# Patient Record
Sex: Male | Born: 1980 | Race: Black or African American | Hispanic: No | Marital: Married | Smoking: Never smoker
Health system: Southern US, Community
[De-identification: ages and names within clinical notes are randomized; demographics above are authoritative.]

## PROBLEM LIST (undated history)

## (undated) DIAGNOSIS — I639 Cerebral infarction, unspecified: Secondary | ICD-10-CM

## (undated) DIAGNOSIS — Z8619 Personal history of other infectious and parasitic diseases: Secondary | ICD-10-CM

## (undated) DIAGNOSIS — M109 Gout, unspecified: Secondary | ICD-10-CM

## (undated) DIAGNOSIS — N289 Disorder of kidney and ureter, unspecified: Secondary | ICD-10-CM

## (undated) DIAGNOSIS — M549 Dorsalgia, unspecified: Secondary | ICD-10-CM

## (undated) DIAGNOSIS — I4891 Unspecified atrial fibrillation: Secondary | ICD-10-CM

## (undated) DIAGNOSIS — K5900 Constipation, unspecified: Secondary | ICD-10-CM

## (undated) DIAGNOSIS — Z8673 Personal history of transient ischemic attack (TIA), and cerebral infarction without residual deficits: Secondary | ICD-10-CM

## (undated) DIAGNOSIS — F419 Anxiety disorder, unspecified: Secondary | ICD-10-CM

## (undated) DIAGNOSIS — G4730 Sleep apnea, unspecified: Secondary | ICD-10-CM

## (undated) DIAGNOSIS — M7989 Other specified soft tissue disorders: Secondary | ICD-10-CM

## (undated) DIAGNOSIS — I10 Essential (primary) hypertension: Secondary | ICD-10-CM

## (undated) DIAGNOSIS — R0602 Shortness of breath: Secondary | ICD-10-CM

## (undated) HISTORY — DX: Gout, unspecified: M10.9

## (undated) HISTORY — DX: Shortness of breath: R06.02

## (undated) HISTORY — DX: Cerebral infarction, unspecified: I63.9

## (undated) HISTORY — DX: Dorsalgia, unspecified: M54.9

## (undated) HISTORY — DX: Sleep apnea, unspecified: G47.30

## (undated) HISTORY — DX: Personal history of other infectious and parasitic diseases: Z86.19

## (undated) HISTORY — DX: Disorder of kidney and ureter, unspecified: N28.9

## (undated) HISTORY — DX: Essential (primary) hypertension: I10

## (undated) HISTORY — DX: Constipation, unspecified: K59.00

## (undated) HISTORY — DX: Unspecified atrial fibrillation: I48.91

## (undated) HISTORY — DX: Anxiety disorder, unspecified: F41.9

## (undated) HISTORY — DX: Other specified soft tissue disorders: M79.89

## (undated) HISTORY — DX: Personal history of transient ischemic attack (TIA), and cerebral infarction without residual deficits: Z86.73

---

## 2008-12-04 ENCOUNTER — Inpatient Hospital Stay (HOSPITAL_COMMUNITY): Admission: EM | Admit: 2008-12-04 | Discharge: 2008-12-06 | Payer: Self-pay | Admitting: Emergency Medicine

## 2008-12-04 ENCOUNTER — Ambulatory Visit: Payer: Self-pay | Admitting: *Deleted

## 2008-12-05 ENCOUNTER — Encounter (INDEPENDENT_AMBULATORY_CARE_PROVIDER_SITE_OTHER): Payer: Self-pay | Admitting: *Deleted

## 2008-12-08 ENCOUNTER — Encounter (INDEPENDENT_AMBULATORY_CARE_PROVIDER_SITE_OTHER): Payer: Self-pay | Admitting: *Deleted

## 2008-12-13 ENCOUNTER — Encounter: Payer: Self-pay | Admitting: *Deleted

## 2008-12-13 LAB — CONVERTED CEMR LAB: LDL Cholesterol: 74 mg/dL

## 2008-12-15 ENCOUNTER — Ambulatory Visit: Payer: Self-pay | Admitting: Internal Medicine

## 2008-12-15 ENCOUNTER — Encounter (INDEPENDENT_AMBULATORY_CARE_PROVIDER_SITE_OTHER): Payer: Self-pay | Admitting: *Deleted

## 2008-12-15 DIAGNOSIS — I10 Essential (primary) hypertension: Secondary | ICD-10-CM | POA: Insufficient documentation

## 2008-12-15 DIAGNOSIS — R319 Hematuria, unspecified: Secondary | ICD-10-CM | POA: Insufficient documentation

## 2008-12-15 DIAGNOSIS — I471 Supraventricular tachycardia: Secondary | ICD-10-CM | POA: Insufficient documentation

## 2008-12-15 DIAGNOSIS — I517 Cardiomegaly: Secondary | ICD-10-CM | POA: Insufficient documentation

## 2008-12-15 HISTORY — DX: Essential (primary) hypertension: I10

## 2008-12-15 LAB — CONVERTED CEMR LAB
Calcium: 10 mg/dL (ref 8.4–10.5)
Glucose, Bld: 91 mg/dL (ref 70–99)
Sodium: 143 meq/L (ref 135–145)

## 2008-12-19 LAB — CONVERTED CEMR LAB
Hemoglobin, Urine: NEGATIVE
Nitrite: NEGATIVE
RBC / HPF: NONE SEEN (ref <3)
Specific Gravity, Urine: 1.023 (ref 1.005–1.03)
pH: 6 (ref 5.0–8.0)

## 2008-12-21 ENCOUNTER — Encounter (INDEPENDENT_AMBULATORY_CARE_PROVIDER_SITE_OTHER): Payer: Self-pay | Admitting: *Deleted

## 2008-12-21 ENCOUNTER — Ambulatory Visit: Payer: Self-pay | Admitting: Vascular Surgery

## 2008-12-29 ENCOUNTER — Ambulatory Visit: Payer: Self-pay | Admitting: Internal Medicine

## 2008-12-29 ENCOUNTER — Encounter: Payer: Self-pay | Admitting: Internal Medicine

## 2008-12-31 LAB — CONVERTED CEMR LAB
BUN: 19 mg/dL (ref 6–23)
Chloride: 102 meq/L (ref 96–112)
Creatinine, Ser: 1.33 mg/dL (ref 0.40–1.50)

## 2009-01-30 ENCOUNTER — Telehealth: Payer: Self-pay | Admitting: Internal Medicine

## 2009-02-06 ENCOUNTER — Ambulatory Visit: Payer: Self-pay | Admitting: *Deleted

## 2009-02-07 ENCOUNTER — Encounter (INDEPENDENT_AMBULATORY_CARE_PROVIDER_SITE_OTHER): Payer: Self-pay | Admitting: *Deleted

## 2009-03-01 ENCOUNTER — Telehealth (INDEPENDENT_AMBULATORY_CARE_PROVIDER_SITE_OTHER): Payer: Self-pay | Admitting: Internal Medicine

## 2009-03-31 ENCOUNTER — Telehealth (INDEPENDENT_AMBULATORY_CARE_PROVIDER_SITE_OTHER): Payer: Self-pay | Admitting: *Deleted

## 2009-05-05 ENCOUNTER — Telehealth (INDEPENDENT_AMBULATORY_CARE_PROVIDER_SITE_OTHER): Payer: Self-pay | Admitting: *Deleted

## 2009-07-05 ENCOUNTER — Telehealth: Payer: Self-pay | Admitting: *Deleted

## 2009-07-07 ENCOUNTER — Telehealth: Payer: Self-pay | Admitting: *Deleted

## 2009-07-25 ENCOUNTER — Ambulatory Visit: Payer: Self-pay | Admitting: Internal Medicine

## 2009-07-25 LAB — CONVERTED CEMR LAB
BUN: 20 mg/dL (ref 6–23)
Calcium: 10 mg/dL (ref 8.4–10.5)
Glucose, Bld: 99 mg/dL (ref 70–99)
Sodium: 140 meq/L (ref 135–145)

## 2009-08-08 ENCOUNTER — Ambulatory Visit: Payer: Self-pay | Admitting: Internal Medicine

## 2009-10-31 ENCOUNTER — Telehealth: Payer: Self-pay | Admitting: *Deleted

## 2009-11-11 ENCOUNTER — Emergency Department (HOSPITAL_COMMUNITY): Admission: EM | Admit: 2009-11-11 | Discharge: 2009-11-11 | Payer: Self-pay | Admitting: Family Medicine

## 2010-01-03 ENCOUNTER — Ambulatory Visit: Payer: Self-pay | Admitting: Internal Medicine

## 2010-01-10 ENCOUNTER — Telehealth: Payer: Self-pay | Admitting: Internal Medicine

## 2010-01-24 ENCOUNTER — Ambulatory Visit: Payer: Self-pay | Admitting: Internal Medicine

## 2010-02-07 ENCOUNTER — Ambulatory Visit: Payer: Self-pay | Admitting: Internal Medicine

## 2010-02-07 LAB — CONVERTED CEMR LAB
CO2: 31 meq/L (ref 19–32)
Calcium: 8.9 mg/dL (ref 8.4–10.5)
Sodium: 141 meq/L (ref 135–145)

## 2010-04-25 ENCOUNTER — Telehealth: Payer: Self-pay | Admitting: Internal Medicine

## 2010-06-08 ENCOUNTER — Telehealth: Payer: Self-pay | Admitting: Internal Medicine

## 2010-06-18 ENCOUNTER — Ambulatory Visit: Payer: Self-pay | Admitting: Internal Medicine

## 2010-06-18 LAB — CONVERTED CEMR LAB
CO2: 24 meq/L (ref 19–32)
Chloride: 103 meq/L (ref 96–112)
Sodium: 140 meq/L (ref 135–145)

## 2010-07-26 IMAGING — CT CT HEAD W/O CM
1 series · 16 of 30 positions shown, 20 images · non-contrast
Comparison: None

CLINICAL DATA: Hypertension.  Cardiomyopathy.  Atrial fibrillation.
Shortness of breath.  Hypertension.

CT HEAD WITHOUT CONTRAST
TECHNIQUE: Contiguous axial images were obtained from the base of
the skull through the vertex without contrast.

[Series 2: head routine 4.8 h37s · axial · 0.46mm/px · z∈[+1185,+1320]mm · 16 of 30 slices shown, 20 images]
[im 2/30  brain]
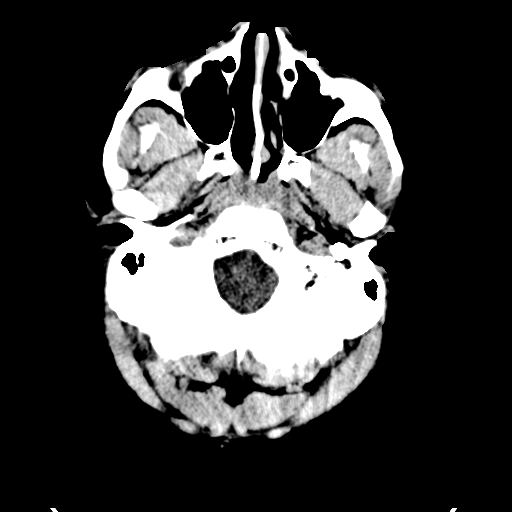
[im 2/30  bone]
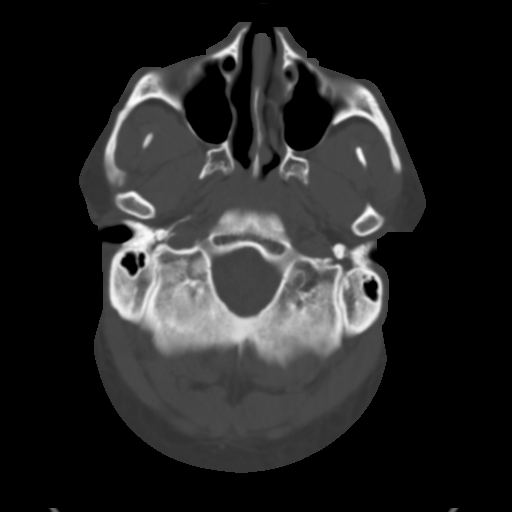
[im 4/30  brain]
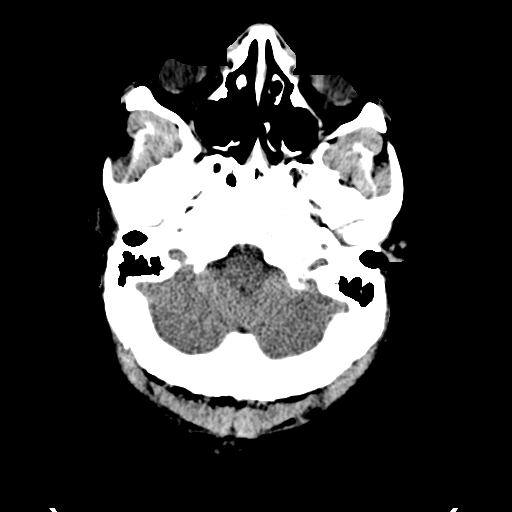
[im 6/30  brain]
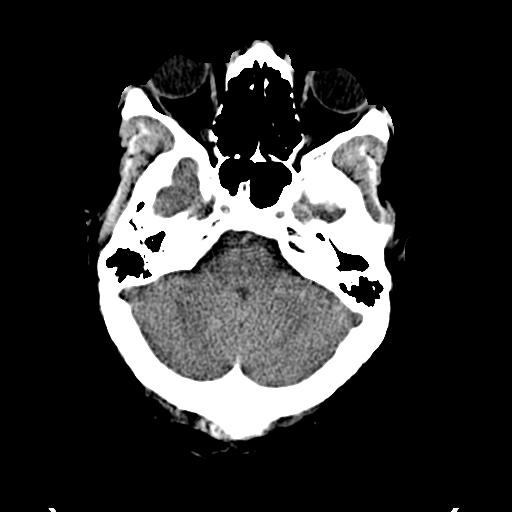
[im 8/30  brain]
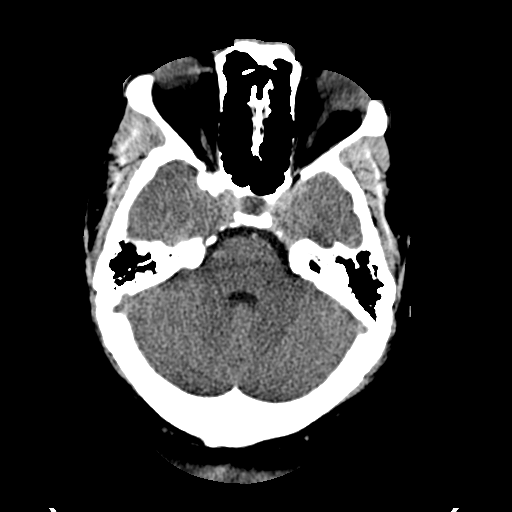
[im 9/30  brain]
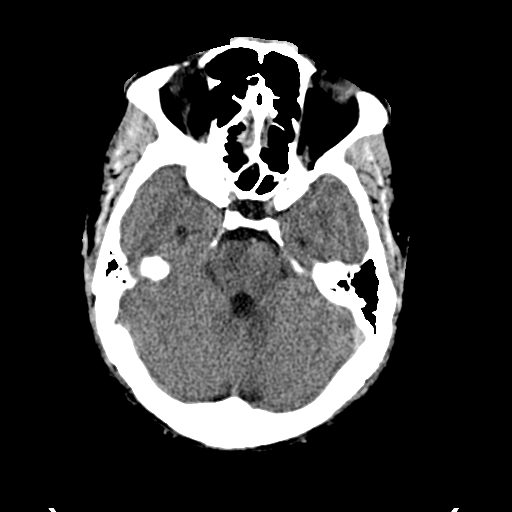
[im 9/30  bone]
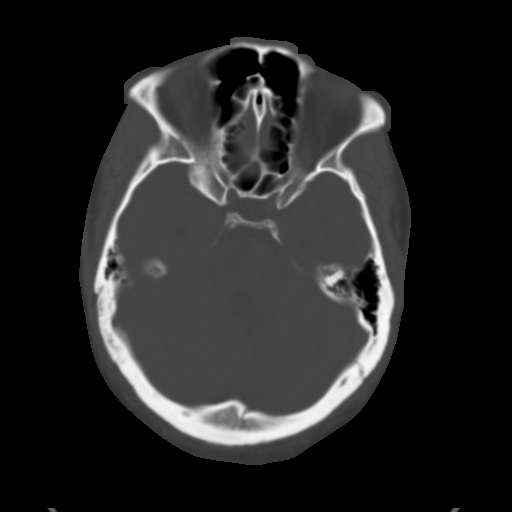
[im 11/30  brain]
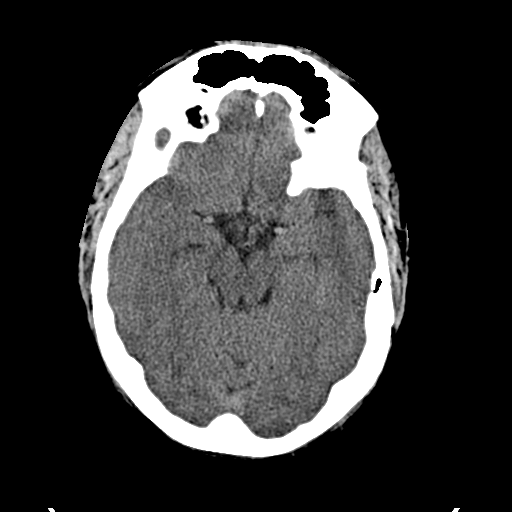
[im 13/30  brain]
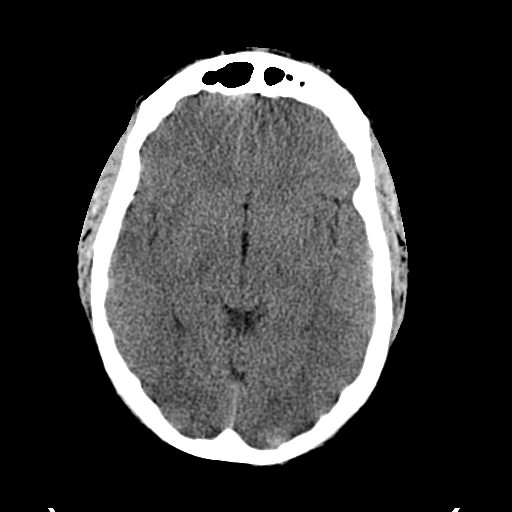
[im 15/30  brain]
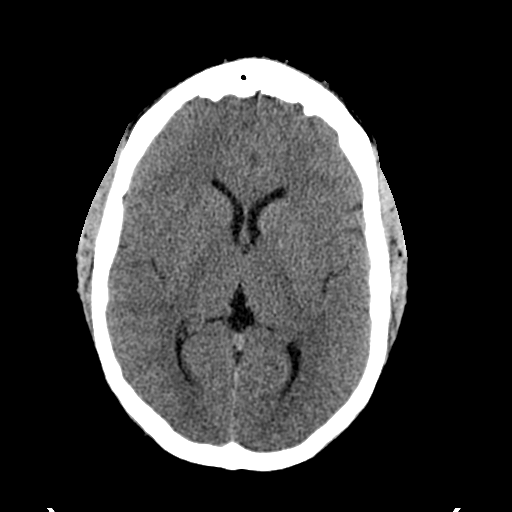
[im 16/30  brain]
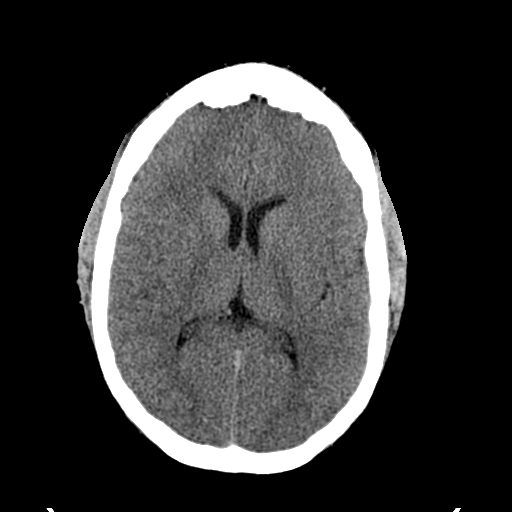
[im 16/30  bone]
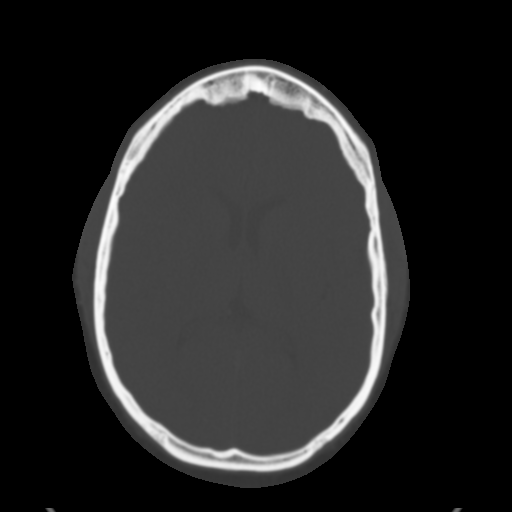
[im 18/30  brain]
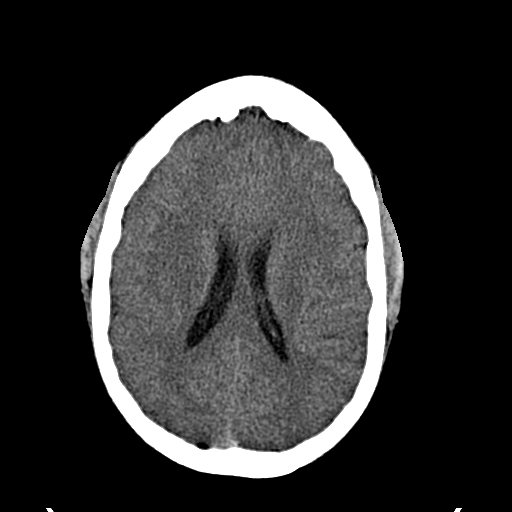
[im 20/30  brain]
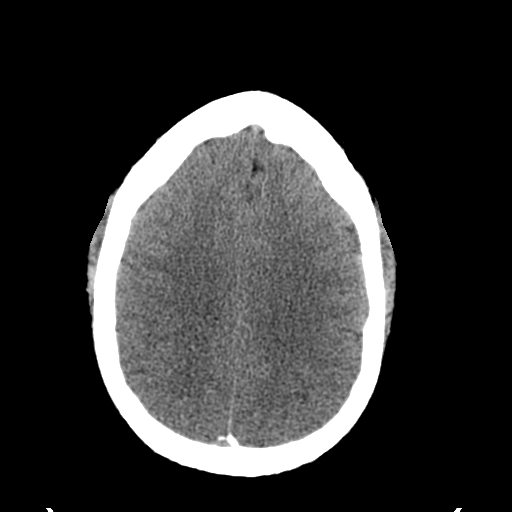
[im 22/30  brain]
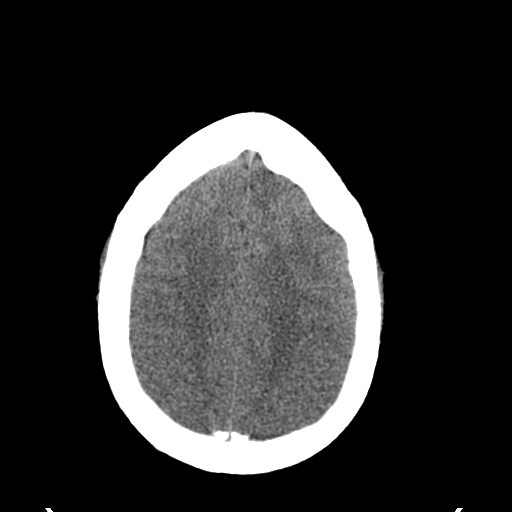
[im 23/30  brain]
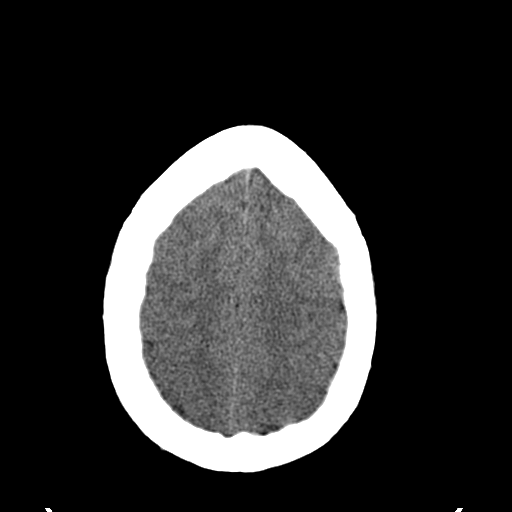
[im 23/30  bone]
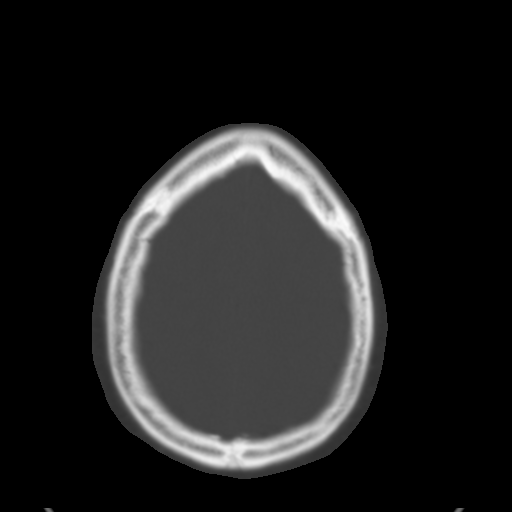
[im 25/30  brain]
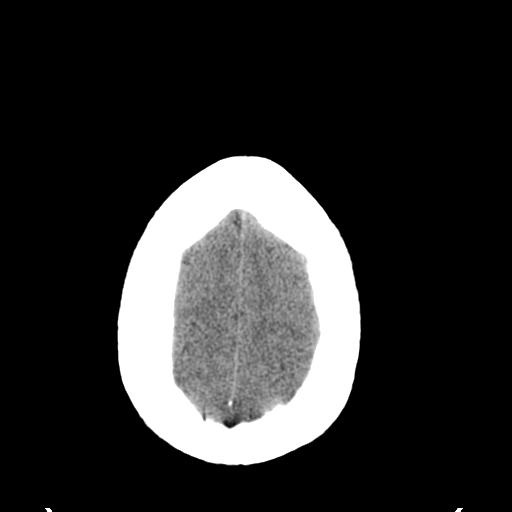
[im 27/30  brain]
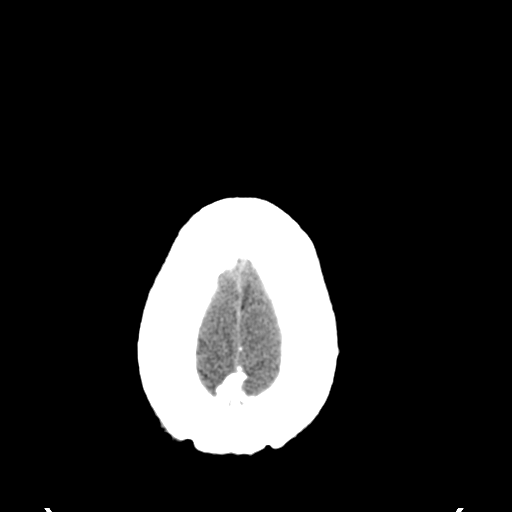
[im 29/30  brain]
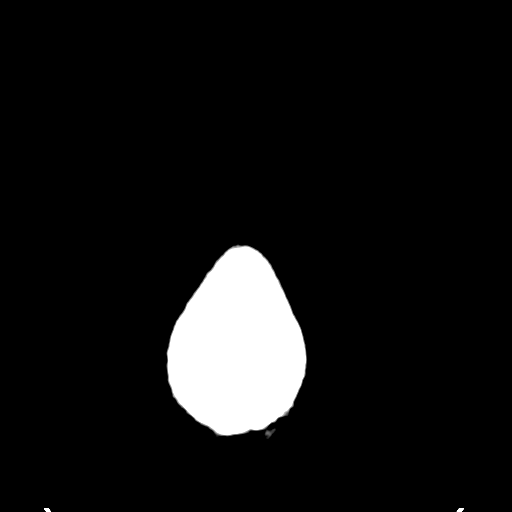

[16 of 30 positions shown; findings below may reference images not displayed]

FINDINGS: Faint density in the vicinity of the tentorium
posteriorly on image 13 of series 2 is ascribed to artifact.

The brain stem, cerebellum, cerebral peduncles, thalami, basal
ganglia, basilar cisterns, and ventricular system appear
unremarkable.

No intracranial hemorrhage, mass lesion, or acute CVA is
identified.
IMPRESSION: 1.  No acute intracranial findings are identified.

## 2010-07-26 IMAGING — CR DG CHEST 2V
2 series · 2 of 2 positions shown · non-contrast
Comparison: None

CLINICAL DATA: Cough and shortness of breath.

CHEST - 2 VIEW

[w chest pa]
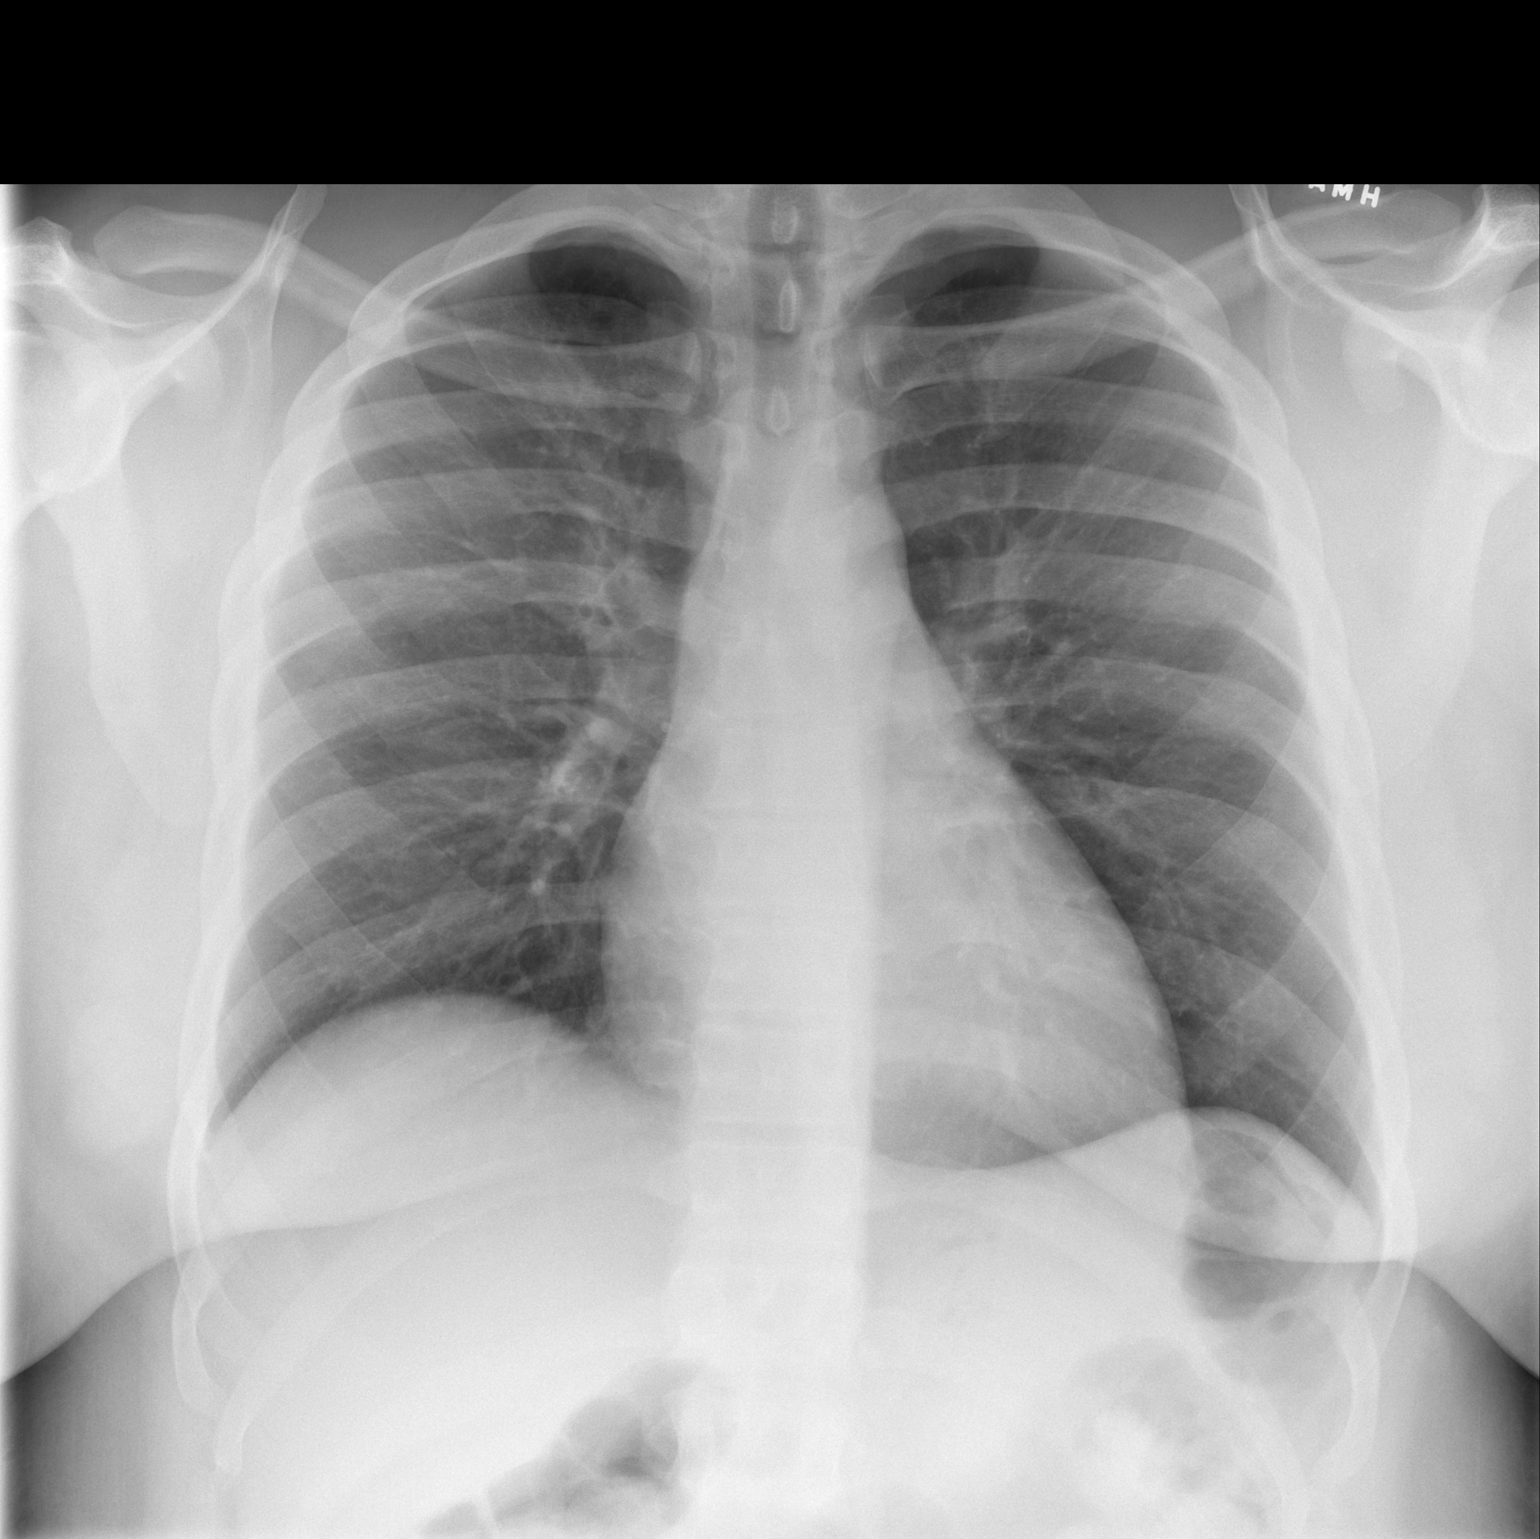

[w chest lat *]
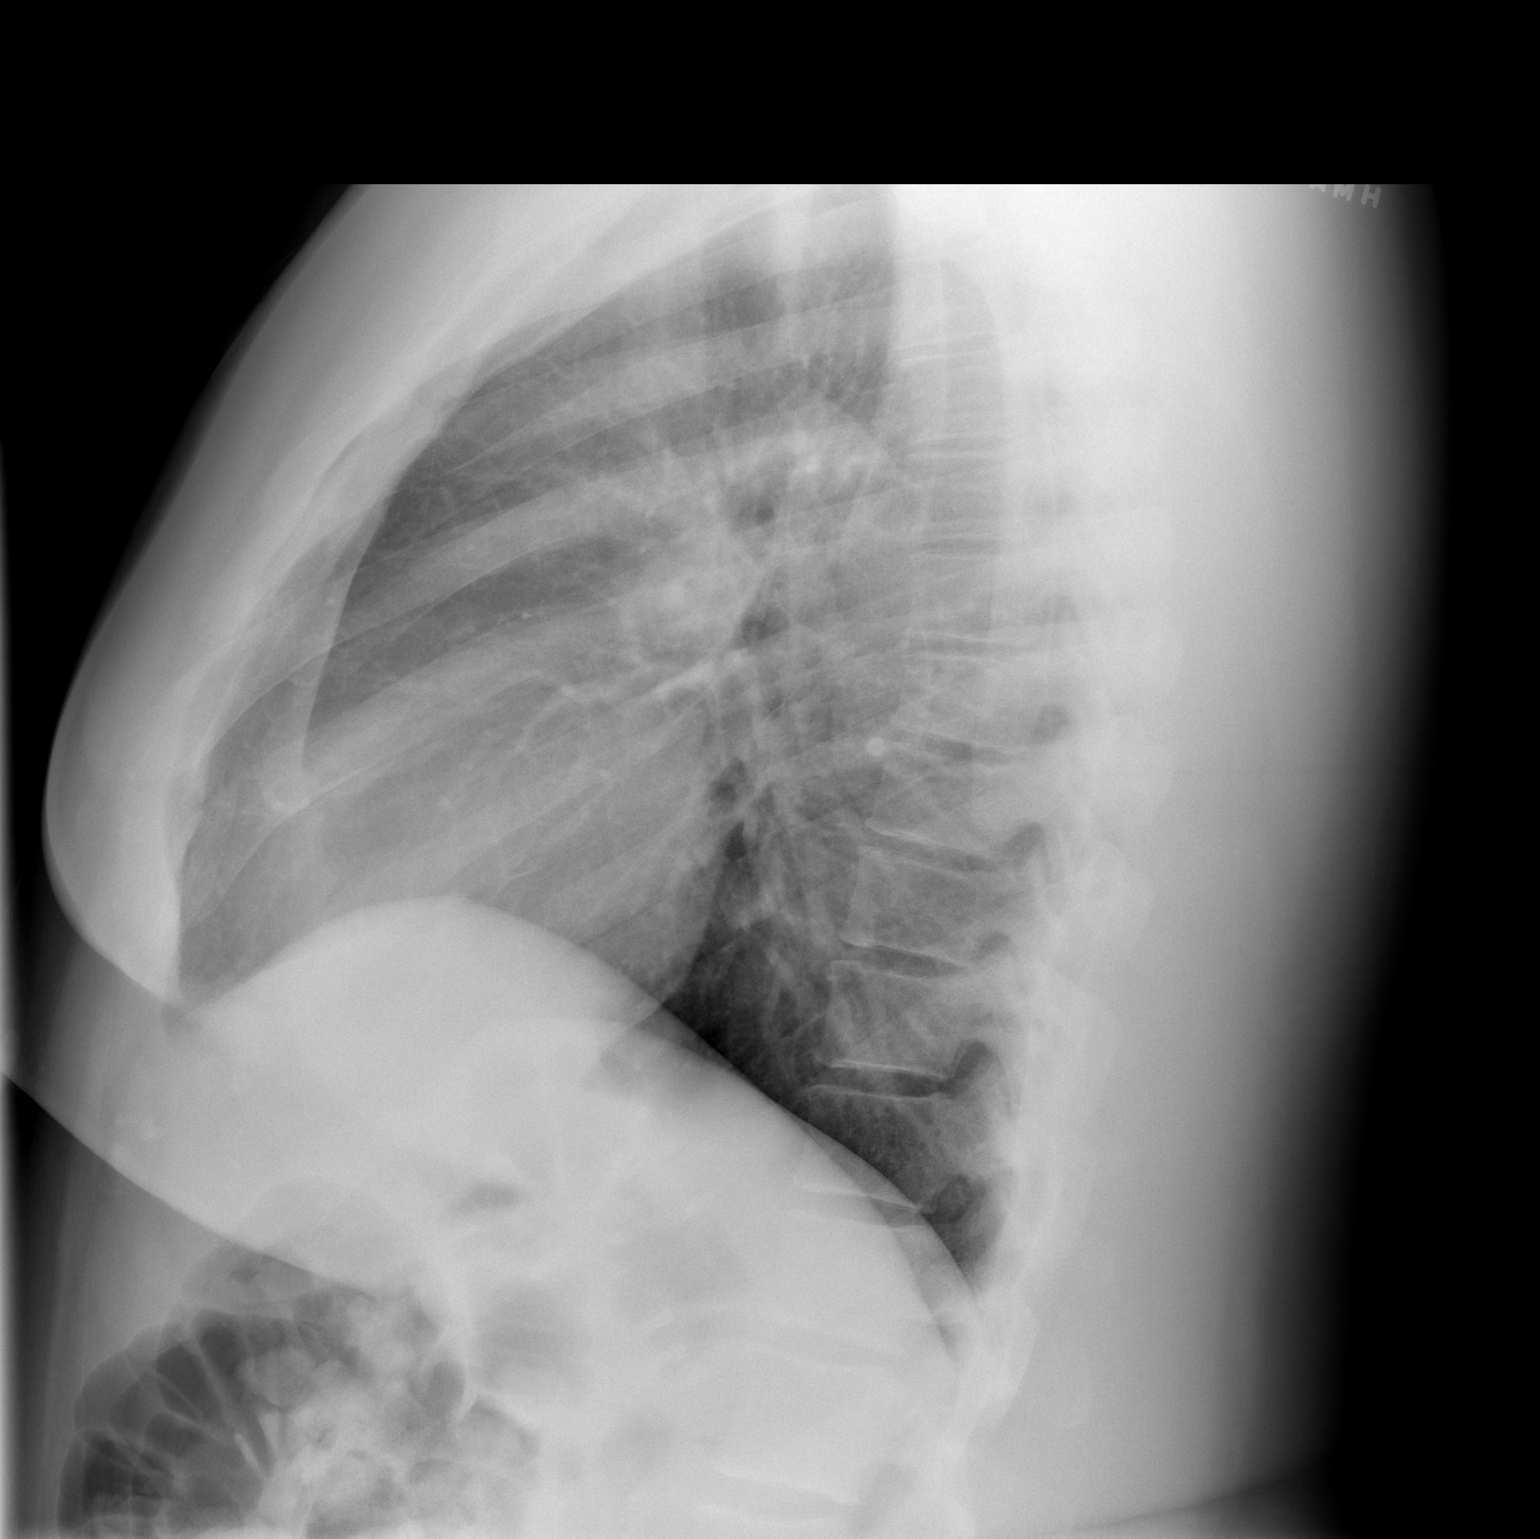

[2 of 2 positions shown; findings below may reference images not displayed]

FINDINGS: The cardiac silhouette, mediastinal and hilar contours
are within normal limits.  There is mild peribronchial thickening
and a few streaky areas of atelectasis.  These findings may be due
to smoking or bronchitis.  No focal infiltrates, edema or
effusions.  The bony thorax is intact.
IMPRESSION: 1.  Mild peribronchial thickening and a few streaky areas of
atelectasis.
2.  No infiltrates, edema or effusions.

## 2010-08-13 ENCOUNTER — Ambulatory Visit: Payer: Self-pay | Admitting: Internal Medicine

## 2010-08-13 LAB — CONVERTED CEMR LAB
BUN: 19 mg/dL (ref 6–23)
CO2: 26 meq/L (ref 19–32)
Chloride: 104 meq/L (ref 96–112)
Creatinine, Ser: 1.35 mg/dL (ref 0.40–1.50)
Glucose, Bld: 94 mg/dL (ref 70–99)

## 2010-08-23 ENCOUNTER — Ambulatory Visit: Payer: Self-pay | Admitting: Internal Medicine

## 2010-08-23 DIAGNOSIS — E663 Overweight: Secondary | ICD-10-CM | POA: Insufficient documentation

## 2010-10-29 ENCOUNTER — Encounter: Payer: Self-pay | Admitting: Internal Medicine

## 2010-10-29 ENCOUNTER — Ambulatory Visit: Payer: Self-pay | Admitting: Internal Medicine

## 2010-10-29 ENCOUNTER — Ambulatory Visit (HOSPITAL_COMMUNITY): Admission: RE | Admit: 2010-10-29 | Discharge: 2010-10-29 | Payer: Self-pay | Admitting: Internal Medicine

## 2010-10-29 DIAGNOSIS — I4891 Unspecified atrial fibrillation: Secondary | ICD-10-CM

## 2010-10-29 HISTORY — DX: Unspecified atrial fibrillation: I48.91

## 2010-11-18 ENCOUNTER — Emergency Department (HOSPITAL_COMMUNITY)
Admission: EM | Admit: 2010-11-18 | Discharge: 2010-11-18 | Payer: Self-pay | Source: Home / Self Care | Admitting: Emergency Medicine

## 2010-11-21 ENCOUNTER — Ambulatory Visit (HOSPITAL_COMMUNITY): Admission: RE | Admit: 2010-11-21 | Payer: Self-pay | Source: Home / Self Care | Admitting: Internal Medicine

## 2010-11-28 DIAGNOSIS — R002 Palpitations: Secondary | ICD-10-CM | POA: Insufficient documentation

## 2010-11-28 DIAGNOSIS — N289 Disorder of kidney and ureter, unspecified: Secondary | ICD-10-CM

## 2010-11-28 DIAGNOSIS — I428 Other cardiomyopathies: Secondary | ICD-10-CM | POA: Insufficient documentation

## 2010-11-28 DIAGNOSIS — N183 Chronic kidney disease, stage 3 unspecified: Secondary | ICD-10-CM | POA: Insufficient documentation

## 2010-11-28 HISTORY — DX: Disorder of kidney and ureter, unspecified: N28.9

## 2010-11-29 ENCOUNTER — Ambulatory Visit: Payer: Self-pay | Admitting: Cardiovascular Disease

## 2011-01-01 NOTE — Assessment & Plan Note (Signed)
Summary: acute-f/u with meds/cfb   Vital Signs:  Patient profile:   30 year old male Height:      73 inches (185.42 cm) Weight:      319.8 pounds (141.41 kg) BMI:     41.19 Temp:     98.4 degrees F (36.89 degrees C) oral Pulse (ortho):   72 / minute BP sitting:   139 / 100  (right arm) Cuff size:   large  Vitals Entered By: Theotis Barrio NT II (January 03, 2010 11:35 AM) CC: FOLLOW UP ON MEDICATION CHANGE  ( HAVING SIDE EFFECTS) Is Patient Diabetic? No Pain Assessment Patient in pain? no      Nutritional Status BMI of > 30 = obese  Have you ever been in a relationship where you felt threatened, hurt or afraid?No   Does patient need assistance? Functional Status Self care Ambulation Normal Comments FOLLOW UP ON MEDICATION CHANGE (HAVING SIDE EFFECTS)   Primary Care Provider:  Linward Foster MD  CC:  FOLLOW UP ON MEDICATION CHANGE  ( HAVING SIDE EFFECTS).  History of Present Illness: 30 yr old man with pmhx as described below comes to the clinic for hypertension follow up. Patient reports that he stopped taking lisinopril because he was having erectile dysfunction with medication.   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 Take 1 Tablet By Mouth Two Times A Day 2)  Hydrochlorothiazide 25 Mg Tabs (Hydrochlorothiazide) .... Take 1 Tablet By Mouth Once A Day 3)  Norvasc 10 Mg Tabs (Amlodipine Besylate)  .... Take 1 Tablet By Mouth Once A Day 4)  Lisinopril 10 Mg Tabs (Lisinopril) .... Take 1 Tablet By Mouth Once A Day  Current Medications (verified): 1)  Coreg 25 Mg Tabs (Carvedilol) .... Take 1 Tablet By Mouth Two Times A Day 2)  Hydrochlorothiazide 25 Mg Tabs (Hydrochlorothiazide) .... Take 1 Tablet By Mouth Once A Day 3)  Norvasc 10 Mg Tabs (Amlodipine Besylate) .... Take 1 Tablet By Mouth Once A Day  Allergies: No Known Drug Allergies  Past History:  Past Medical History: Last updated: 12/29/2008 Hypertension  Risk Factors: Alcohol Use: <1 (01/03/2010) Exercise: yes (01/03/2010)  Risk Factors: Smoking Status: never (01/03/2010) Passive Smoke Exposure: no (01/03/2010)  Review of Systems  The patient denies fever, chest pain, syncope, dyspnea on exertion, peripheral edema, prolonged cough, headaches, hemoptysis, abdominal pain, melena, hematochezia, hematuria, muscle weakness, difficulty walking, and unusual weight change.    Physical Exam  General:  NAD Mouth:   pharynx pink and moist, no erythema, and no exudates.    Neck:  supple.   Lungs:  Normal respiratory effort, chest expands symmetrically. Lungs are clear to auscultation, no crackles or wheezes. Heart:  Normal rate and regular rhythm. S1 and S2 normal without gallop, murmur, click, rub or other extra sounds. Abdomen:  soft, non-tender, and normal bowel sounds.   Msk:  normal ROM.  Extremities:  no edema  Neurologic:  alert & oriented X3, strength normal in all extremities, sensation intact to light touch, and gait normal.     Impression & Recommendations:  Problem # 1:  HYPERTENSION (ICD-401.9) Above goal. Not taking lisinopril due to impotence. Patient only took medication for 2 weeks.  Will restart clonidine and recheck BP in one month.  The following medications were removed from the medication list:    Lisinopril 10 Mg Tabs (Lisinopril) .Marland Kitchen... Take 1 tablet by mouth once a day His updated medication  list for this problem includes:    Coreg 25 Mg Tabs (Carvedilol) .Marland Kitchen... Take 1 tablet by mouth two times a day    Hydrochlorothiazide 25 Mg Tabs (Hydrochlorothiazide) .Marland Kitchen... Take 1 tablet by mouth once a day    Norvasc 10 Mg Tabs (Amlodipine besylate) .Marland Kitchen... Take 1 tablet by mouth once a day    Catapres 0.1 Mg Tabs (Clonidine hcl) .Marland Kitchen... Take 1 tablet by mouth two times a day  BP today: 139/100 Prior BP: 140/100 (08/08/2009)  Labs Reviewed: K+: 4.0 (07/25/2009) Creat: : 1.43 (07/25/2009)   LDL: 74  --  12/05/2008 (12/13/2008)     Complete Medication List: 1)  Coreg 25 Mg Tabs (Carvedilol) .... Take 1 tablet by mouth two times a day 2)  Hydrochlorothiazide 25 Mg Tabs (Hydrochlorothiazide) .... Take 1 tablet by mouth once a day 3)  Norvasc 10 Mg Tabs (Amlodipine besylate) .... Take 1 tablet by mouth once a day 4)  Catapres 0.1 Mg Tabs (Clonidine hcl) .... Take 1 tablet by mouth two times a day  Patient Instructions: 1)  Please schedule a follow-up appointment in 1 month. 2)  Stop taking lisinopril and start taking clonidine as prescribed. 3)  Take all other medication as directed. Prescriptions: CATAPRES 0.1 MG TABS (CLONIDINE HCL) Take 1 tablet by mouth two times a day  #60 x 1   Entered and Authorized by:   Laren Everts MD   Signed by:   Laren Everts MD on 01/03/2010   Method used:   Electronically to        [facility] [address] (retail)       [id number]       [place], [place]  [address]       Ph: [phone]       Fax: [phone]   RxID:   [id number]    Prevention & Chronic Care Immunizations   Influenza vaccine: Not documented    Tetanus booster: Not documented    Pneumococcal vaccine: Not documented  Other Screening   Smoking status: never  (01/03/2010)  Hypertension   Last Blood Pressure: 139 / 100  (01/03/2010)   Serum creatinine: 1.43  (07/25/2009)   Serum potassium 4.0  (07/25/2009)     Hypertension flowsheet reviewed?: Yes   Progress toward BP goal: Unchanged  Self-Management Support :   Personal Goals (by the next clinic visit) :      Personal blood pressure goal: 140/90  (01/03/2010)   Patient will work on the following items until the next clinic visit to reach self-care goals:     Medications and monitoring: take my medicines every day, bring all of my medications to every visit  (01/03/2010)     Eating: eat more vegetables, use fresh or frozen vegetables, eat foods that are low in salt, eat baked foods instead of fried foods  (08/08/2009)     Activity: park at the far  end of the parking lot  (08/08/2009)    Hypertension self-management support: Written self-care plan  (01/03/2010)   Hypertension self-care plan printed.

## 2011-01-01 NOTE — Assessment & Plan Note (Signed)
Summary: EST-3 MONTH F/U VISIT/CH   Vital Signs:  Patient profile:   30 year old male Height:      73 inches (185.42 cm) Weight:      330.7 pounds (150.32 kg) BMI:     43.79 Temp:     97.5 degrees F (36.39 degrees C) oral Pulse rate:   65 / minute BP sitting:   148 / 100  (left arm)  Vitals Entered By: Stanton Kidney Ditzler RN (October 29, 2010 4:05 PM) Is Patient Diabetic? No Pain Assessment Patient in pain? no      Nutritional Status BMI of > 30 = obese Nutritional Status Detail appetite good  Have you ever been in a relationship where you felt threatened, hurt or afraid?denies   Does patient need assistance? Functional Status Self care Ambulation Normal Comments FU   Primary Care Provider:  Darnelle Maffucci MD   History of Present Illness: Thomas [name] is 30 year old man with pmh significant for HTN, SVT, and possible HCM, who presents today for Blood pressure recheck and regular followup.   States he has been taking his medications as prescribed. He has no acute complaints or concerns today. He denies SOB, cough, cp, headache, N/V, changes in bowel and urinary habits.      Depression History:      The patient denies a depressed mood most of the day and a diminished interest in his usual daily activities.         Preventive Screening-Counseling & Management  Alcohol-Tobacco     Alcohol drinks/day: <1     Alcohol type: spirits     Smoking Status: never     Passive Smoke Exposure: no  Caffeine-Diet-Exercise     Does Patient Exercise: yes     Type of exercise: CARDIO     Times/week: 2-3  Current Medications (verified): 1)  Coreg Cr 80 Mg Xr24h-Cap (Carvedilol Phosphate) .... Take 1 Tablet By Mouth Once A Day 2)  Benicar Hct 40-25 Mg Tabs (Olmesartan Medoxomil-Hctz) .... Take 1 Tablet By Mouth Once A Day 3)  Norvasc 10 Mg Tabs (Amlodipine Besylate) .... Take 1 Tablet By Mouth Once A Day  Allergies (verified): No Known Drug Allergies  Review of Systems       Per  HPI  Physical Exam  General:  alert and well-developed.   Lungs:  normal respiratory effort and normal breath sounds.   Heart:  Irregularly irregular rhythm, no murmur, no gallop, and no rub.   Abdomen:  soft, non-tender, and normal bowel sounds.   Msk:  normal ROM.   Pulses:  +2 pulses throughout Extremities:  no LE edema  Neurologic:  alert & oriented X3.     Impression & Recommendations:  Problem # 1:  ATRIAL FIBRILLATION (ICD-427.31) Assessment New Denies further palpitations, however on exam i noted an Irreguar rhythm therefore i obtained an EKG which showed AFIB,  his CHADS2 score is 1. will start on baby aspirin today. he is rate controlled with his coreg, however given this new finding and echo from last year showing possible HCM, will repeat ECHO and send to Cardio for possible cardioversion and further management for his AFIB.    The following medications were removed from the medication list:    Coreg Cr 20 Mg Xr24h-cap (Carvedilol phosphate) .Marland Kitchen... Take 1 tablet by mouth once a day His updated medication list for this problem includes:    Coreg Cr 80 Mg Xr24h-cap (Carvedilol phosphate) .Marland Kitchen... Take 1 tablet by mouth once a  day    Norvasc 10 Mg Tabs (Amlodipine besylate) .Marland Kitchen... Take 1 tablet by mouth once a day    Th Aspirin Low Dose 81 Mg Tbec (Aspirin) .Marland Kitchen... Take 1 tablet by mouth once a day  Orders: 2 D Echo (2 D Echo) Cardiology Referral (Cardiology)  Problem # 2:  VENTRICULAR HYPERTROPHY, LEFT (ICD-429.3) Assessment: Comment Only Some concern for hypertrophic cardiomyopathy per echo, however pt denies any family history of this.  ECHO FROM 2010   -  Left ventricular size was normal.   -  Overall left ventricular systolic function was normal.   -  Left ventricular ejection fraction was estimated , range being 55         % to 60 %.   -  This study was inadequate for the evaluation of left ventricular         regional wall motion.   -  Left ventricular wall  thickness was markedly increased         (concentric - question long standing hypertension versus         familial hypertrophic cardiomyopathy. There was no         obstruction).  Problem # 3:  HYPERTENSION, BENIGN ESSENTIAL, UNCONTROLLED (ICD-401.1) Assessment: Comment Only slightly elevated, will increase coreg.   The following medications were removed from the medication list:    Coreg Cr 20 Mg Xr24h-cap (Carvedilol phosphate) .Marland Kitchen... Take 1 tablet by mouth once a day His updated medication list for this problem includes:    Coreg Cr 80 Mg Xr24h-cap (Carvedilol phosphate) .Marland Kitchen... Take 1 tablet by mouth once a day    Benicar Hct 40-25 Mg Tabs (Olmesartan medoxomil-hctz) .Marland Kitchen... Take 1 tablet by mouth once a day    Norvasc 10 Mg Tabs (Amlodipine besylate) .Marland Kitchen... 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 Take 1 tablet by mouth once a day His updated medication list for this problem includes:    Coreg Cr 80 Mg Xr24h-cap (Carvedilol phosphate) .Marland Kitchen... Take 1 tablet by mouth once a day    Th Aspirin Low Dose 81 Mg Tbec (Aspirin) .Marland Kitchen... Take 1 tablet by mouth once a day  Orders: 12 Lead EKG (12 Lead EKG)  Complete Medication List: 1)  Coreg Cr 80 Mg Xr24h-cap (Carvedilol phosphate) .... Take 1 tablet by mouth once a day 2)  Benicar Hct 40-25 Mg Tabs (Olmesartan medoxomil-hctz) .... Take 1 tablet by mouth once a day 3)  Norvasc 10 Mg Tabs (Amlodipine besylate) .... Take 1 tablet by mouth once a day 4)  Th Aspirin Low Dose 81 Mg Tbec (Aspirin) .... Take 1 tablet by mouth once a day  Other Orders: Influenza Vaccine NON MCR ([address])  Anticoagulation Management Assessment/Plan:             Patient Instructions: 1)  Please schedule a follow-up appointment in 6 months. 2)  Please  take a baby aspirin daily for AFIB Prescriptions: TH ASPIRIN LOW DOSE 81 MG TBEC (ASPIRIN) Take 1 tablet by mouth once a day  #30 x 3   Entered and Authorized by:   Darnelle Maffucci MD   Signed by:   Darnelle Maffucci MD on 10/29/2010   Method used:   Electronically to        [facility]* (retail)  [address]       [address]mail[address]       [place], [place]  [address]       Ph: [phone]       Fax: [phone]   RxID:   [id number] COREG CR 80 MG XR24H-CAP (CARVEDILOL PHOSPHATE) Take 1 tablet by mouth once a day  #30 x 3   Entered and Authorized by:   Darnelle Maffucci MD   Signed by:   Darnelle Maffucci MD on 10/29/2010   Method used:   Electronically to        [facility]* (retail)       [address]       [address]mail[address]       [place], [place]  [address]       Ph: [phone]       Fax: [phone]   RxID:   [id number]    Orders Added: 1)  12 Lead EKG [12 Lead EKG] 2)  Est. Patient Level IV [88416] 3)  Influenza Vaccine NON MCR [00028] 4)  2 D Echo [2 D Echo] 5)  Cardiology Referral [Cardiology]   Immunizations Administered:  Influenza Vaccine # 1:    Vaccine Type: Fluvax Non-MCR    Site: right deltoid    Mfr: GlaxoSmithKline    Dose: 0.5 ml    Route: IM    Given by: Stanton Kidney Ditzler RN    Exp. Date: 06/01/2011    Lot #: [id number]    VIS given: 06/26/10 version given October 29, 2010.  Flu Vaccine Consent Questions:    Do you have a history of severe allergic reactions to this vaccine? no    Any prior history of allergic reactions to egg and/or gelatin? no    Do you have a sensitivity to the preservative Thimersol? no    Do you have a past history of Guillan-Barre Syndrome? no    Do you currently have an acute febrile illness? no    Have you ever had a severe reaction to latex? no    Vaccine information given and explained to patient? yes   Immunizations Administered:  Influenza  Vaccine # 1:    Vaccine Type: Fluvax Non-MCR    Site: right deltoid    Mfr: GlaxoSmithKline    Dose: 0.5 ml    Route: IM    Given by: Stanton Kidney Ditzler RN    Exp. Date: 06/01/2011    Lot #: [id number]    VIS given: 06/26/10 version given October 29, 2010.  Prevention & Chronic Care Immunizations   Influenza vaccine: Fluvax Non-MCR  (10/29/2010)   Influenza vaccine deferral: Deferred  (08/13/2010)    Tetanus booster: Not documented   Td booster deferral: Refused  (06/18/2010)    Pneumococcal vaccine: Not documented  Other Screening   Smoking status: never  (10/29/2010)  Hypertension   Last Blood Pressure: 148 / 100  (10/29/2010)   Serum creatinine: 1.35  (08/13/2010)   BMP action: Ordered   Serum potassium 4.1  (08/13/2010)    Hypertension flowsheet reviewed?: Yes   Progress toward BP goal: Unchanged  Self-Management Support :   Personal Goals (by the next clinic visit) :      Personal blood pressure goal: 140/90  (01/03/2010)   Patient will work on the following items until the next clinic visit to reach self-care goals:     Medications and monitoring: take my medicines every day, check my blood pressure, bring all of my  medications to every visit, weigh myself weekly  (10/29/2010)     Eating: drink diet soda or water instead of juice or soda, eat more vegetables, use fresh or frozen vegetables, eat foods that are low in salt, eat baked foods instead of fried foods, eat fruit for snacks and desserts  (10/29/2010)     Activity: take a 30 minute walk every day  (10/29/2010)    Hypertension self-management support: , Referred for medical nutrition therapy, Written self-care plan, Education handout, Resources for patients handout  (10/29/2010)   Hypertension self-care plan printed.   Hypertension education handout printed      Resource handout printed.   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

## 2011-01-01 NOTE — Assessment & Plan Note (Signed)
Summary: EST-2 MONTH F/U VISIT/CH   Vital Signs:  Patient profile:   30 year old male Height:      73 inches (185.42 cm) Weight:      323.4 pounds (147.00 kg) BMI:     42.82 Temp:     97.8 degrees F (36.56 degrees C) oral Pulse rate:   86 / minute BP sitting:   193 / 136  (right arm)  Vitals Entered By: Stanton Kidney Ditzler RN (August 13, 2010 3:02 PM) CC: elevated blood pressure Is Patient Diabetic? No Pain Assessment Patient in pain? no      Nutritional Status BMI of > 30 = obese Nutritional Status Detail appetite good  Have you ever been in a relationship where you felt threatened, hurt or afraid?denies   Does patient need assistance? Functional Status Self care Ambulation Normal Comments FU   Primary Care Provider:  Darnelle Maffucci MD  CC:  elevated blood pressure.  History of Present Illness: Patient is here today for regular follow up.  BP is 193/136 on manual check it was 182/123., given his once a day regimen which he has been sticking to pretty well, however he ran out of meds this weekend.   He has gained 15 pounds since the last time he was here and wants to loose weight.  No other complaints.  Depression History:      The patient denies a depressed mood most of the day and a diminished interest in his usual daily activities.         Preventive Screening-Counseling & Management  Alcohol-Tobacco     Alcohol drinks/day: <1     Alcohol type: spirits     Smoking Status: never     Passive Smoke Exposure: no  Caffeine-Diet-Exercise     Does Patient Exercise: yes     Type of exercise: CARDIO     Times/week: 2-3  Current Medications (verified): 1)  Coreg Cr 40 Mg Xr24h-Cap (Carvedilol Phosphate) .... Take 1 Tablet By Mouth Once A Day 2)  Benicar Hct 40-25 Mg Tabs (Olmesartan Medoxomil-Hctz) .... Take 1 Tablet By Mouth Once A Day 3)  Norvasc 10 Mg Tabs (Amlodipine Besylate) .... Take 1 Tablet By Mouth Once A Day 4)  Coreg Cr 20 Mg Xr24h-Cap (Carvedilol  Phosphate) .... Take 1 Tablet By Mouth Once A Day  Allergies (verified): No Known Drug Allergies  Review of Systems       as per hpi  Physical Exam  General:  alert, cooperative to examination, and overweight-appearing.   Lungs:  normal respiratory effort and no accessory muscle use.   Heart:  Normal rate and regular rhythm. S1 and S2 normal without gallop, murmur, click, rub or other extra sounds. Msk:  normal ROM.   Extremities:  no edema  Neurologic:  alert & oriented X3, cranial nerves II-XII intact, and gait normal.     Impression & Recommendations:  Problem # 1:  HYPERTENSION, BENIGN ESSENTIAL, UNCONTROLLED (ICD-401.1) Assessment Deteriorated BP worrisome today. will increase his coreg to 60mg  by adding 20mg  to current regiment of 40.  and give him his meds and recheck in one week, will also get BMET given his high BP.   His updated medication list for this problem includes:    Coreg Cr 40 Mg Xr24h-cap (Carvedilol phosphate) .Marland Kitchen... Take 1 tablet by mouth once a day    Benicar Hct 40-25 Mg Tabs (Olmesartan medoxomil-hctz) .Marland Kitchen... Take 1 tablet by mouth once a day    Norvasc 10 Mg Tabs (Amlodipine  besylate) .Marland Kitchen... Take 1 tablet by mouth once a day    Coreg Cr 20 Mg Xr24h-cap (Carvedilol phosphate) .Marland Kitchen... 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   His updated medication list for this problem includes:    Coreg Cr 40 Mg Xr24h-cap (Carvedilol phosphate) .Marland Kitchen... Take 1 tablet by mouth once a day    Coreg Cr 20 Mg Xr24h-cap (Carvedilol phosphate) .Marland Kitchen... Take 1 tablet by mouth once a day  Complete Medication List: 1)  Coreg Cr 40 Mg Xr24h-cap (Carvedilol phosphate) .... Take 1 tablet by mouth once a day 2)  Benicar Hct 40-25 Mg Tabs (Olmesartan medoxomil-hctz) .... Take 1 tablet by  mouth once a day 3)  Norvasc 10 Mg Tabs (Amlodipine besylate) .... Take 1 tablet by mouth once a day 4)  Coreg Cr 20 Mg Xr24h-cap (Carvedilol phosphate) .... Take 1 tablet by mouth once a day  Patient Instructions: 1)  Please schedule a follow-up appointment in 1 week. Prescriptions: COREG CR 40 MG XR24H-CAP (CARVEDILOL PHOSPHATE) Take 1 tablet by mouth once a day  #30 x 3   Entered and Authorized by:   Darnelle Maffucci MD   Signed by:   Darnelle Maffucci MD on 08/13/2010   Method used:   Electronically to        [facility]* (retail)       [address]       [address] Shipping/mailing       [place], [place]  [address]       Ph: [phone]       Fax: [phone]   RxID:   [id number] BENICAR HCT 40-25 MG TABS (OLMESARTAN MEDOXOMIL-HCTZ) Take 1 tablet by mouth once a day  #30 x 3   Entered and Authorized by:   Darnelle Maffucci MD   Signed by:   Darnelle Maffucci MD on 08/13/2010   Method used:   Electronically to        [facility]* (retail)       [address]       [address] Shipping/mailing       [place], [place]  [address]       Ph: [phone]       Fax: [phone]   RxID:   [id number] NORVASC 10 MG TABS (AMLODIPINE BESYLATE) Take 1 tablet by mouth once a day  #30 x 6   Entered and Authorized by:   Darnelle Maffucci MD   Signed by:   Darnelle Maffucci MD on 08/13/2010   Method used:   Electronically to        [facility]* (retail)       [address]       [address] Shipping/mailing       [place], [place]  [address]       Ph: [phone]       Fax: [phone]   RxID:   [id number] COREG CR 20 MG XR24H-CAP (CARVEDILOL PHOSPHATE) Take 1 tablet by mouth once a day  #30 x 3   Entered and Authorized by:   Darnelle Maffucci MD   Signed by:   Darnelle Maffucci MD on 08/13/2010   Method used:   Electronically to        [facility]* (  retail)       [address]       [address]  Shipping/mailing       [place], [place]  [address]       Ph: [phone]       Fax: [phone]   RxID:   [id number]  Process Orders Check Orders Results:     [facility]: ABN not required for this insurance Tests Sent for requisitioning (August 13, 2010 4:00 PM):     08/13/2010: [facility] -- T-Basic Metabolic Panel [id number] (signed)     Prevention & Chronic Care Immunizations   Influenza vaccine: Not documented   Influenza vaccine deferral: Deferred  (08/13/2010)    Tetanus booster: Not documented   Td booster deferral: Refused  (06/18/2010)    Pneumococcal vaccine: Not documented  Other Screening   Smoking status: never  (08/13/2010)  Hypertension   Last Blood Pressure: 193 / 136  (08/13/2010)   Serum creatinine: 1.24  (06/18/2010)   BMP action: Ordered   Serum potassium 3.9  (06/18/2010)  Self-Management Support :   Personal Goals (by the next clinic visit) :      Personal blood pressure goal: 140/90  (01/03/2010)   Patient will work on the following items until the next clinic visit to reach self-care goals:     Medications and monitoring: take my medicines every day, check my blood pressure, bring all of my medications to every visit, weigh myself weekly  (08/13/2010)     Eating: eat more vegetables, use fresh or frozen vegetables, eat foods that are low in salt, eat baked foods instead of fried foods, eat fruit for snacks and desserts  (08/13/2010)     Activity: take a 30 minute walk every day  (08/13/2010)    Hypertension self-management support: Written self-care plan, Education handout, Resources for patients handout  (08/13/2010)   Hypertension self-care plan printed.   Hypertension education handout printed      Resource handout printed.

## 2011-01-01 NOTE — Assessment & Plan Note (Signed)
Summary: 2WK F/U/TOBBIA/VS   Vital Signs:  Patient profile:   30 year old male Height:      73 inches (185.42 cm) Weight:      319.2 pounds (145.09 kg) BMI:     42.27 Temp:     99.3 degrees F (37.39 degrees C) oral Pulse rate:   82 / minute BP sitting:   128 / 88  (right arm) Cuff size:   large  Vitals Entered By: Krystal Eaton Cassell Dull) (February 07, 2010 10:44 AM) CC: 2wk bp reck Is Patient Diabetic? No Pain Assessment Patient in pain? no      Nutritional Status BMI of > 30 = obese  Have you ever been in a relationship where you felt threatened, hurt or afraid?No   Does patient need assistance? Functional Status Self care Ambulation Normal   CC:  2wk bp reck.  Preventive Screening-Counseling & Management  Alcohol-Tobacco     Alcohol drinks/day: <1     Alcohol type: spirits     Smoking Status: never     Passive Smoke Exposure: no  Allergies: No Known Drug Allergies   Impression & Recommendations:  Problem # 1:  HYPERTENSION (ICD-401.9) Assessment Improved Patient was here today for a follow up appointment for a check of his BP and Bmet. His BP is well controlled today, he is compliant with his meds and his K and Crt are WNL. I will have him back in 1 month for a BP check up and after that every 6 months. His updated medication list for this problem includes:    Coreg Cr 40 Mg Xr24h-cap (Carvedilol phosphate) .Marland Kitchen... Take 1 tablet by mouth once a day    Benicar Hct 40-25 Mg Tabs (Olmesartan medoxomil-hctz) .Marland Kitchen... Take 1 tablet by mouth once a day    Norvasc 10 Mg Tabs (Amlodipine besylate) .Marland Kitchen... Take 1 tablet by mouth once a day  Orders: T-Basic Metabolic Panel [id number])  BP today: 128/88 Prior BP: 135/92 (01/24/2010)  Labs Reviewed: K+: 4.0 (07/25/2009) Creat: : 1.43 (07/25/2009)   LDL: 74  --  12/05/2008 (12/13/2008)     Problem # 2:  Preventive Health Care (ICD-V70.0) Assessment: Comment Only  Reviewed preventive care protocols, scheduled due  services, and updated immunizations. No vaccines or procedures due at this time.  Complete Medication List: 1)  Coreg Cr 40 Mg Xr24h-cap (Carvedilol phosphate) .... Take 1 tablet by mouth once a day 2)  Benicar Hct 40-25 Mg Tabs (Olmesartan medoxomil-hctz) .... Take 1 tablet by mouth once a day 3)  Norvasc 10 Mg Tabs (Amlodipine besylate) .... Take 1 tablet by mouth once a day  Patient Instructions: 1)  Please schedule a follow-up appointment in 1 month. 2)  It is important that you exercise regularly at least 20 minutes 5 times a week. If you develop chest pain, have severe difficulty breathing, or feel very tired , stop exercising immediately and seek medical attention. 3)  You need to lose weight. Consider a lower calorie diet and regular exercise.  4)  It is not healthy  for men to drink more than 2-3 drinks per day or for women to drink more than 1-2 drinks per day. 5)  Check your Blood Pressure regularly. If it is above: 140/90 you should make an appointment. Prescriptions: NORVASC 10 MG TABS (AMLODIPINE BESYLATE) Take 1 tablet by mouth once a day  #30 x 1   Entered and Authorized by:   Lars Mage MD   Signed by:   Vondra Aldredge Eben Burow  MD on 02/07/2010   Method used:   Faxed to ...       [facility] (retail)       [address] [place], [place]  [address]       Ph: [phone]       Fax: [phone]   RxID:   [id number] BENICAR HCT 40-25 MG TABS (OLMESARTAN MEDOXOMIL-HCTZ) Take 1 tablet by mouth once a day  #30 x 2   Entered and Authorized by:   Lars Mage MD   Signed by:   Lars Mage MD on 02/07/2010   Method used:   Faxed to ...       [facility] (retail)       [address] [place], [place]  [address]       Ph: [phone]       Fax: [phone]   RxID:   [id number] COREG CR 40 MG XR24H-CAP (CARVEDILOL PHOSPHATE) Take 1 tablet by mouth once a day  #30 x 2   Entered and Authorized  by:   Lars Mage MD   Signed by:   Lars Mage MD on 02/07/2010   Method used:   Faxed to ...       [facility] (retail)       [address] [place], [place]  [address]       Ph: [phone]       Fax: [phone]   RxID:   [id number] NORVASC 10 MG TABS (AMLODIPINE BESYLATE) Take 1 tablet by mouth once a day  #30 x 1   Entered and Authorized by:   Lars Mage MD   Signed by:   Lars Mage MD on 02/07/2010   Method used:   Print then Give to Patient   RxID:   [id number] BENICAR HCT 40-25 MG TABS (OLMESARTAN MEDOXOMIL-HCTZ) Take 1 tablet by mouth once a day  #30 x 2   Entered and Authorized by:   Lars Mage MD   Signed by:   Lars Mage MD on 02/07/2010   Method used:   Print then Give to Patient   RxID:   [id number] COREG CR 40 MG XR24H-CAP (CARVEDILOL PHOSPHATE) Take 1 tablet by mouth once a day  #30 x 2   Entered and Authorized by:   Lars Mage MD   Signed by:   Lars Mage MD on 02/07/2010   Method used:   Print then Give to Patient   RxID:   [id number]  Process Orders Check Orders Results:     [facility]: ABN not required for this insurance Tests Sent for requisitioning (February 07, 2010 1:33 PM):     02/07/2010: [facility] -- T-Basic Metabolic Panel [id number] (signed)   Prevention & Chronic Care Immunizations   Influenza vaccine: Not documented   Influenza vaccine deferral: Refused  (02/07/2010)    Tetanus booster: Not documented   Td booster deferral: Deferred  (02/07/2010)    Pneumococcal vaccine: Not documented  Other Screening   Smoking status: never  (02/07/2010)  Hypertension   Last Blood Pressure: 128 / 88  (02/07/2010)   Serum creatinine: 1.43  (07/25/2009)   Serum potassium 4.0  (07/25/2009)    Hypertension flowsheet reviewed?:  Yes   Progress toward BP goal: At goal  Self-Management Support :   Personal Goals (by the next clinic visit) :       Personal blood pressure goal: 140/90  (01/03/2010)   Patient will work on the following items until the next clinic visit to reach self-care goals:     Medications and monitoring: take my medicines every day  (02/07/2010)     Eating: eat foods that are low in salt, eat baked foods instead of fried foods  (02/07/2010)     Activity: join a walking program  (02/07/2010)    Hypertension self-management support: Pre-printed educational material, Education handout, Resources for patients handout, Written self-care plan  (02/07/2010)   Hypertension self-care plan printed.   Hypertension education handout printed      Resource handout printed.   Appended Document: 2WK F/U/TOBBIA/VS     Primary Care Provider:  Darnelle Maffucci MD   History of Present Illness: Patient is 30 year old man with PMH of uncontrolled hypertention presents today for a follow up appointmnet and lab check as he was started on a new med.  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 Take 1 Tablet By Mouth Once A Day 2)  Benicar Hct 40-25 Mg Tabs (Olmesartan Medoxomil-Hctz) .... Take 1 Tablet By Mouth Once A Day 3)  Norvasc 10 Mg Tabs (Amlodipine Besylate) .... Take 1 Tablet By Mouth Once A Day  Current Medications (verified): 1)  Coreg Cr 40 Mg Xr24h-Cap (Carvedilol Phosphate) .... Take 1 Tablet By Mouth Once A Day 2)  Benicar Hct 40-25 Mg Tabs (Olmesartan Medoxomil-Hctz) .... Take 1 Tablet By Mouth Once A Day 3)  Norvasc 10 Mg Tabs (Amlodipine Besylate) .... Take 1 Tablet By Mouth Once A Day  Allergies (verified): No Known Drug Allergies  Past History:  Past Medical History: Last updated: 12/29/2008 Hypertension  Risk Factors: Alcohol  Use: <1 (02/07/2010) Exercise: yes (01/24/2010)  Risk Factors: Smoking Status: never (02/07/2010) Passive Smoke Exposure: no (02/07/2010)  Review of Systems      See HPI  Physical Exam  Additional Exam:  Gen: AOx3, in no acute distress Eyes: PERRL, EOMI ENT:MMM, No erythema noted in posterior pharynx Neck: No JVD, No LAP Chest: CTAB with  good respiratory effort CVS: regular rhythmic rate, NO M/R/G, S1 S2 normal Abdo: soft,ND, BS+x4, Non tender and No hepatosplenomegaly EXT: No odema noted Neuro: Non focal, gait is normal Skin: no rashes noted.    Complete Medication List: 1)  Coreg Cr 40 Mg Xr24h-cap (Carvedilol phosphate) .... Take 1 tablet by mouth once a day 2)  Benicar Hct 40-25 Mg Tabs (Olmesartan medoxomil-hctz) .... Take 1 tablet by mouth once a day 3)  Norvasc 10 Mg Tabs (Amlodipine besylate) .... Take 1 tablet by mouth once a day  Appended Document: 2WK F/U/TOBBIA/VS Above Rxs. called to [facility];per pt.

## 2011-01-01 NOTE — Progress Notes (Signed)
Summary: refill/ hla  Phone Note Refill Request Message from:  Fax from Pharmacy on June 08, 2010 3:07 PM  Refills Requested: Medication #1:  COREG CR 40 MG XR24H-CAP Take 1 tablet by mouth once a day   Dosage confirmed as above?Dosage Confirmed   Last Refilled: 4/1  Medication #2:  BENICAR HCT 40-25 MG TABS Take 1 tablet by mouth once a day   Dosage confirmed as above?Dosage Confirmed   Last Refilled: 4/1 last visit and labs  3/9  Initial call taken by: Marin Roberts RN,  June 08, 2010 3:07 PM  Follow-up for Phone Call        Refill approved-nurse to complete Follow-up by: Julaine Fusi  DO,  June 08, 2010 5:25 PM    Prescriptions: BENICAR HCT 40-25 MG TABS (OLMESARTAN MEDOXOMIL-HCTZ) Take 1 tablet by mouth once a day  #30 x 2   Entered by:   Julaine Fusi  DO   Authorized by:   Darnelle Maffucci MD   Signed by:   Julaine Fusi  DO on 06/08/2010   Method used:   Electronically to        [facility]* (retail)       [address]       [address] Shipping/mailing       [place], [place]  [address]       Ph: [phone]       Fax: [phone]   RxID:   [id number] COREG CR 40 MG XR24H-CAP (CARVEDILOL PHOSPHATE) Take 1 tablet by mouth once a day  #30 x 2   Entered by:   Julaine Fusi  DO   Authorized by:   Darnelle Maffucci MD   Signed by:   Julaine Fusi  DO on 06/08/2010   Method used:   Electronically to        [facility]* (retail)       [address]       [address] Shipping/mailing       [place], [place]  [address]       Ph: [phone]       Fax: [phone]   RxID:   [id number]

## 2011-01-01 NOTE — Progress Notes (Signed)
Summary: refill/gg  Phone Note Refill Request  on Apr 25, 2010 3:50 PM  Refills Requested: Medication #1:  NORVASC 10 MG TABS Take 1 tablet by mouth once a day.  Follow-up for Phone Call        please advise patient to make a f/u at [facility] for further refills.  Follow-up by: Darnelle Maffucci MD,  Apr 26, 2010 11:51 AM  Additional Follow-up for Phone Call Additional follow up Details #1::        [facility]  sent to [facility] for an appt. Additional Follow-up by: Chinita Pester RN,  Apr 26, 2010 2:13 PM    Prescriptions: NORVASC 10 MG TABS (AMLODIPINE BESYLATE) Take 1 tablet by mouth once a day  #30 x 1   Entered and Authorized by:   Darnelle Maffucci MD   Signed by:   Chinita Pester RN on 04/26/2010   Method used:   Electronically to        [facility]* (retail)       [address]       [address] Shipping/mailing       [place], [place]  [address]       Ph: [phone]       Fax: [phone]   RxID:   [id number]

## 2011-01-01 NOTE — Assessment & Plan Note (Signed)
Summary: EST-CK/FU/MEDS/CFB   Vital Signs:  Patient profile:   30 year old male Height:      73 inches (185.42 cm) Weight:      332.4 pounds (151.09 kg) BMI:     44.01 Temp:     97.9 degrees F (36.61 degrees C) oral Pulse rate:   59 / minute BP sitting:   144 / 92  (left arm) Cuff size:   large  Vitals Entered By: Cynda Familia Bartley Dull) (June 18, 2010 4:21 PM) CC: f/u HTN Is Patient Diabetic? No Pain Assessment Patient in pain? no      Nutritional Status BMI of > 30 = obese  Have you ever been in a relationship where you felt threatened, hurt or afraid?No   Does patient need assistance? Functional Status Self care Ambulation Normal   Primary Care Provider:  Darnelle Maffucci MD  CC:  f/u HTN.  History of Present Illness: Patient is here today for regular follow up.  BP is 144/92, given his once a day regimen which he has been sticking to pretty well, I would not increase his meds at this time. Will check bmet.  He has gained 15 pounds since the last time he was here and wants to loose weight.  No other complaints.  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 Take 1 Tablet By Mouth Once A Day 2)  Benicar Hct 40-25 Mg Tabs (Olmesartan Medoxomil-Hctz) .... Take 1 Tablet By Mouth Once A Day 3)  Norvasc 10 Mg Tabs (Amlodipine Besylate) .... Take 1 Tablet By Mouth Once A Day  Current Medications (verified): 1)  Coreg Cr 40 Mg Xr24h-Cap (Carvedilol Phosphate) .... Take 1 Tablet By Mouth Once A Day 2)  Benicar Hct 40-25 Mg  Tabs (Olmesartan Medoxomil-Hctz) .... Take 1 Tablet By Mouth Once A Day 3)  Norvasc 10 Mg Tabs (Amlodipine Besylate) .... Take 1 Tablet By Mouth Once A Day  Allergies (verified): No Known Drug Allergies  Past History:  Past Medical History: Last updated: 12/29/2008 Hypertension  Risk Factors: Alcohol Use: <1 (06/18/2010) Exercise: yes (01/24/2010)  Risk Factors: Smoking Status: never (06/18/2010) Passive Smoke Exposure: no (06/18/2010)  Review of Systems      See HPI  Physical Exam  Additional Exam:  Gen: AOx3, in no acute distress Eyes: PERRL, EOMI ENT:MMM, No erythema noted in posterior pharynx Neck: No JVD, No LAP Chest: CTAB with  good respiratory effort CVS: regular rhythmic rate, NO M/R/G, S1 S2 normal Abdo: soft,ND, BS+x4, Non tender and No hepatosplenomegaly EXT: No odema noted Neuro: Non focal, gait is normal Skin: no rashes noted.    Impression & Recommendations:  Problem # 1:  HYPERTENSION, BENIGN ESSENTIAL, UNCONTROLLED (ICD-401.1) Assessment Deteriorated Patient has gained 15 pounds since last seen, I discussed various weight loss strategies with him including some documenraries that he could see for some ideas and inspiration. This is most likely contributing to his increased BP. Will check Bmet for K and Crt. No changes inmeds today as he is very complinat with his current regimen. We agrred upon weight  loss as the primary treatment startegy at this time and may be we can discuss weight loss the next time we follow up.  His updated medication list for this problem includes:    Coreg Cr 40 Mg Xr24h-cap (Carvedilol phosphate) .Marland Kitchen... Take 1 tablet by mouth once a day    Benicar Hct 40-25 Mg Tabs (Olmesartan medoxomil-hctz) .Marland Kitchen... Take 1 tablet by mouth once a day    Norvasc 10 Mg Tabs (Amlodipine besylate) .Marland Kitchen... Take 1 tablet by mouth once a day  Orders: T-Basic Metabolic Panel [id number])  BP today: 144/92 Prior BP: 128/88 (02/07/2010)  Labs  Reviewed: K+: 3.6 (02/07/2010) Creat: : 1.32 (02/07/2010)   LDL: 74  --  12/05/2008 (12/13/2008)     Problem # 2:  PAROXYSMAL SUPRAVENTRICULAR TACHYCARDIA (ICD-427.0) Assessment: Comment Only rate and rhytm was normal sinus and regular respectively. He HR wass 59 which given he is on a BB is explanable.  His updated medication list for this problem includes:    Coreg Cr 40 Mg Xr24h-cap (Carvedilol phosphate) .Marland Kitchen... Take 1 tablet by mouth once a day  Labs Reviewed: Na: 141 (02/07/2010)   K+: 3.6 (02/07/2010)   CL: 107 (02/07/2010)   HCO3: 31 (02/07/2010) Ca: 8.9 (02/07/2010)   TSH: 1.674 (12/29/2008)   HCO3: 31 (02/07/2010)  Problem # 3:  Preventive Health Care (ICD-V70.0) Assessment: Comment Only  Reviewed preventive care protocols, scheduled due services, and updated immunizations. Refused tetanus.  Complete Medication List: 1)  Coreg Cr 40 Mg Xr24h-cap (Carvedilol phosphate) .... Take 1 tablet by mouth once a day 2)  Benicar Hct 40-25 Mg Tabs (Olmesartan medoxomil-hctz) .... Take 1 tablet by mouth once a day 3)  Norvasc 10 Mg Tabs (Amlodipine besylate) .... Take 1 tablet by mouth once a day  Patient Instructions: 1)  Please go on this website"Fat, sick and nearly dead" and watch this movie. 2)  Please schedule a follow-up appointment in 2 weeks. 3)  It is important that you exercise regularly at least 20 minutes 5 times a week. If you develop chest pain, have severe difficulty breathing, or feel very tired , stop exercising immediately and seek medical attention. 4)  You need to lose weight. Consider a lower calorie diet and regular exercise.  5)  Check your Blood Pressure regularly. If it is above: 140/90 you should make an appointment. 6)  Limit your Sodium (Salt). Process Orders Check Orders Results:     [facility]: ABN not required for this insurance Tests Sent for requisitioning (June 19, 2010 11:00 AM):     06/18/2010: [facility] -- T-Basic  Metabolic Panel [id number] (signed)    Prevention & Chronic Care Immunizations   Influenza vaccine: Not documented   Influenza vaccine deferral: Deferred  (06/18/2010)    Tetanus booster: Not documented   Td booster deferral: Refused  (06/18/2010)    Pneumococcal vaccine: Not documented  Other Screening   Smoking status: never  (06/18/2010)  Hypertension   Last Blood Pressure: 144 / 92  (06/18/2010)   Serum creatinine: 1.32  (02/07/2010)   BMP action: Ordered   Serum potassium 3.6  (02/07/2010)    Hypertension flowsheet reviewed?: Yes   Progress toward BP goal: At goal  Self-Management Support :   Personal Goals (by the next clinic visit) :      Personal blood pressure goal: 140/90  (01/03/2010)   Patient will work on the following items until the next clinic visit to reach self-care goals:     Medications and  monitoring: take my medicines every day  (06/18/2010)     Eating: eat foods that are low in salt, eat baked foods instead of fried foods  (06/18/2010)     Activity: take a 30 minute walk every day  (06/18/2010)    Hypertension self-management support: Pre-printed educational material, Resources for patients handout  (06/18/2010)      Resource handout printed.    Process Orders Check Orders Results:     [facility]: ABN not required for this insurance Tests Sent for requisitioning (June 19, 2010 11:00 AM):     06/18/2010: [facility] -- T-Basic Metabolic Panel [id number] (signed)

## 2011-01-01 NOTE — Assessment & Plan Note (Signed)
Summary: F/U TOBBIA   Vital Signs:  Patient profile:   30 year old male Height:      73 inches (185.42 cm) Weight:      320.9 pounds (145.36 kg) BMI:     42.49 Temp:     97.3 degrees F (36.28 degrees C) Pulse rate:   69 / minute BP sitting:   135 / 92  (right arm) Cuff size:   large  Vitals Entered By: Theotis Barrio NT II (January 24, 2010 4:00 PM) CC:  PATIENT IS HERE FOR BP FOLLOW UP APPT  /  NO CONCERNS NOR COMPLAINTS Is Patient Diabetic? No Pain Assessment Patient in pain? no      Nutritional Status BMI of > 30 = obese  Have you ever been in a relationship where you felt threatened, hurt or afraid?No   Does patient need assistance? Functional Status Self care Ambulation Normal Comments PATIENT IS HERE FOR BP FOLLO W UP APPT / NO CONCERNS NOR  COMPLAINTS   Primary Care Provider:  Linward Foster MD  CC:   PATIENT IS HERE FOR BP FOLLOW UP APPT  /  NO CONCERNS NOR COMPLAINTS.  History of Present Illness: Mr Fickle is a 30 yo man with PMH as outlined below.  He is here to f/u on BP.  He reports taking his medications as prescribed.  He admits to occasionally forgetting to take his pm dose of coreg and clonidine but this happens infrequently.  He would also like to know if there is any way to simplify the regimen in any way.     Depression History:      The patient denies a depressed mood most of the day and a diminished interest in his usual daily activities.         Preventive Screening-Counseling & Management  Alcohol-Tobacco     Alcohol drinks/day: <1     Alcohol type: spirits     Smoking Status: never     Passive Smoke Exposure: no  Caffeine-Diet-Exercise     Does Patient Exercise: yes     Type of exercise: CARDIO     Times/week: 2-3  Medications Prior to Update: 1)  Coreg 25 Mg Tabs (Carvedilol) .... Take 1 Tablet By Mouth Two Times A Day 2)  Hydrochlorothiazide 25 Mg Tabs (Hydrochlorothiazide) .... Take 1 Tablet By Mouth Once A Day 3)  Norvasc 10 Mg  Tabs (Amlodipine Besylate) .... Take 1 Tablet By Mouth Once A Day 4)  Catapres 0.1 Mg Tabs (Clonidine Hcl) .... Take 1 Tablet By Mouth Two Times A Day  Allergies (verified): No Known Drug Allergies  Past History:  Past Medical History: Last updated: 12/29/2008 Hypertension  Risk Factors: Smoking Status: never (01/24/2010) Passive Smoke Exposure: no (01/24/2010)  Review of Systems      See HPI  Physical Exam  General:  alert, cooperative to examination, and overweight-appearing.   Head:  normocephalic and atraumatic.   Eyes:  anicteric Lungs:  normal respiratory effort and no accessory muscle use.   Neurologic:  alert & oriented X3, cranial nerves II-XII intact, and gait normal.   Psych:  Oriented X3, memory intact for recent and remote, normally interactive, good eye contact, not anxious appearing, and not depressed appearing.     Impression & Recommendations:  Problem # 1:  HYPERTENSION (ICD-401.9) After discussion with pt, I believe there is some potential to simplify regimen: 1.  Will change coreg to Coreg CR (once daily) at 40mg  daily and increase to 80mg  if  BP still elevated and HR tolerates it 2.  Continue Norvasc as this is at max dose. 3.  Stop clonidine as this usually provides large swings in BP and more importantly, pt experiences some sedation from it. 4.  Will start benicar (prior intolerance to ACE-I with cough/impotence..resolved with cessation)  5.  Combine HCTZ into benicar for less pill burden (RTC for BMET in 2 weeks) If on f/u BP not at goal and Coreg CR increased to 80mg  at that time does not provide sufficient control, consider minoxidil (once daily and no sedation)  New regimen below:  The following medications were removed from the medication list:    Catapres 0.1 Mg Tabs (Clonidine hcl) .Marland Kitchen... Take 1 tablet by mouth two times a day His updated medication list for this problem includes:    Coreg Cr 40 Mg Xr24h-cap (Carvedilol phosphate) .Marland Kitchen... Take 1  tablet by mouth once a day    Benicar Hct 40-25 Mg Tabs (Olmesartan medoxomil-hctz) .Marland Kitchen... Take 1 tablet by mouth once a day    Norvasc 10 Mg Tabs (Amlodipine besylate) .Marland Kitchen... Take 1 tablet by mouth once a day  BP today: 135/92 Prior BP: 139/100 (01/03/2010)  Labs Reviewed: K+: 4.0 (07/25/2009) Creat: : 1.43 (07/25/2009)   LDL: 74  --  12/05/2008 (12/13/2008)     Complete Medication List: 1)  Coreg Cr 40 Mg Xr24h-cap (Carvedilol phosphate) .... Take 1 tablet by mouth once a day 2)  Benicar Hct 40-25 Mg Tabs (Olmesartan medoxomil-hctz) .... Take 1 tablet by mouth once a day 3)  Norvasc 10 Mg Tabs (Amlodipine besylate) .... Take 1 tablet by mouth once a day  Patient Instructions: 1)  Please schedule a follow-up appointment in 2 weeks for BP check and BMET. 2)  Have changed medications as listed below. 3)  IF you have any problems, call clinic. 4)  If you get dizzy, especially when standing, call clinic.  Prescriptions: BENICAR HCT 40-25 MG TABS (OLMESARTAN MEDOXOMIL-HCTZ) Take 1 tablet by mouth once a day  #30 x 2   Entered and Authorized by:   Mariea Stable MD   Signed by:   Mariea Stable MD on 01/24/2010   Method used:   Electronically to        [facility] [address] (retail)       [phone]       [place], [place]  [address]       Ph: [phone]       Fax: [phone]   RxID:   [id number] COREG CR 40 MG XR24H-CAP (CARVEDILOL PHOSPHATE) Take 1 tablet by mouth once a day  #30 x 2   Entered and Authorized by:   Mariea Stable MD   Signed by:   Mariea Stable MD on 01/24/2010   Method used:   Electronically to        [facility] [address] (retail)       [phone]       [place], [place]  [address]       Ph: [phone]       Fax: [phone]   RxID:   [id number]    Prevention & Chronic Care Immunizations   Influenza vaccine: Not documented    Tetanus booster: Not  documented    Pneumococcal vaccine: Not documented  Other Screening   Smoking status: never  (01/24/2010)  Hypertension   Last Blood Pressure: 135 / 92  (  01/24/2010)   Serum creatinine: 1.43  (07/25/2009)   Serum potassium 4.0  (07/25/2009)  Self-Management Support :   Personal Goals (by the next clinic visit) :      Personal blood pressure goal: 140/90  (01/03/2010)   Patient will work on the following items until the next clinic visit to reach self-care goals:     Medications and monitoring: take my medicines every day, check my blood pressure, bring all of my medications to every visit  (01/24/2010)     Eating: drink diet soda or water instead of juice or soda, eat more vegetables, use fresh or frozen vegetables, eat foods that are low in salt, eat baked foods instead of fried foods, limit or avoid alcohol  (01/24/2010)     Activity: take a 30 minute walk every day  (01/24/2010)    Hypertension self-management support: Resources for patients handout  (01/24/2010)      Resource handout printed.

## 2011-01-01 NOTE — Progress Notes (Signed)
Summary: refill/ hla  Phone Note Refill Request Message from:  Patient on January 10, 2010 3:04 PM  Refills Requested: Medication #1:  HYDROCHLOROTHIAZIDE 25 MG TABS Take 1 tablet by mouth once a day  Medication #2:  NORVASC 10 MG TABS Take 1 tablet by mouth once a day Initial call taken by: Marin Roberts RN,  January 10, 2010 3:04 PM  Follow-up for Phone Call       Follow-up by: Darnelle Maffucci MD,  January 11, 2010 9:22 AM    Prescriptions: NORVASC 10 MG TABS (AMLODIPINE BESYLATE) Take 1 tablet by mouth once a day  #30 x 1   Entered and Authorized by:   Darnelle Maffucci MD   Signed by:   Darnelle Maffucci MD on 01/11/2010   Method used:   Electronically to        [facility] [address] (retail)       [phone]       [place], [place]  [address]       Ph: [phone]       Fax: [phone]   RxID:   [id number] HYDROCHLOROTHIAZIDE 25 MG TABS (HYDROCHLOROTHIAZIDE) Take 1 tablet by mouth once a day  #30 x 1   Entered and Authorized by:   Darnelle Maffucci MD   Signed by:   Darnelle Maffucci MD on 01/11/2010   Method used:   Electronically to        [facility] [address] (retail)       [phone]       [place], [place]  [address]       Ph: [phone]       Fax: [phone]   RxID:   [id number]

## 2011-01-01 NOTE — Assessment & Plan Note (Signed)
Summary: FU 1 WEEK VISIT/TOBBIA/DS   Vital Signs:  Patient profile:   30 year old male Height:      73 inches Temp:     97.4 degrees F oral Pulse rate:   70 / minute BP sitting:   130 / 90  (right arm)  Vitals Entered By: Filomena Jungling NT II (August 23, 2010 3:25 PM) CC: blood pressure recheck Is Patient Diabetic? No Pain Assessment Patient in pain? no       Does patient need assistance? Functional Status Self care Ambulation Normal   Primary Care Dakotah Heiman:  Darnelle Maffucci MD  CC:  blood pressure recheck.  History of Present Illness: Thomas [name] is 30 year old man with pmh significant for HTN, who presents today for Blood pressure recheck.   States he has been taking his medications as prescribed. He has no acute complaints or concerns today. He denies SOB, cough, cp, headache, N/V, changes in bowel and urinary habits.      Preventive Screening-Counseling & Management  Alcohol-Tobacco     Alcohol drinks/day: <1     Alcohol type: spirits     Smoking Status: never     Passive Smoke Exposure: no  Caffeine-Diet-Exercise     Does Patient Exercise: yes     Type of exercise: CARDIO     Times/week: 2-3  Current Medications (verified): 1)  Coreg Cr 40 Mg Xr24h-Cap (Carvedilol Phosphate) .... Take 1 Tablet By Mouth Once A Day 2)  Benicar Hct 40-25 Mg Tabs (Olmesartan Medoxomil-Hctz) .... Take 1 Tablet By Mouth Once A Day 3)  Norvasc 10 Mg Tabs (Amlodipine Besylate) .... Take 1 Tablet By Mouth Once A Day 4)  Coreg Cr 20 Mg Xr24h-Cap (Carvedilol Phosphate) .... 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   Head:  normocephalic and atraumatic.   Eyes:  vision grossly intact, pupils equal, pupils round, and pupils reactive to light.   Mouth:  good dentition and pharynx pink and moist.   Neck:  supple.   Lungs:  normal respiratory effort and normal breath sounds.   Heart:  normal rate, regular rhythm, no murmur, no gallop, and no rub.   Abdomen:  soft, non-tender, and normal bowel sounds.   Msk:  normal ROM.   Extremities:  no LE edema  Neurologic:  alert & oriented X3.     Impression & Recommendations:  Problem # 1:  HYPERTENSION, BENIGN ESSENTIAL, UNCONTROLLED (ICD-401.1) Assessment Improved Continue current regimen.   His updated medication list for this problem includes:    Coreg Cr 40 Mg Xr24h-cap (Carvedilol phosphate) .Marland Kitchen... Take 1 tablet by mouth once a day    Benicar Hct 40-25 Mg Tabs (Olmesartan medoxomil-hctz) .Marland Kitchen... Take 1 tablet by mouth once a day    Norvasc 10 Mg Tabs (Amlodipine besylate) .Marland Kitchen... Take 1 tablet by mouth once a day  Coreg Cr 20 Mg Xr24h-cap (Carvedilol phosphate) .Marland Kitchen... Take 1 tablet by mouth once a day  BP today: 130/90 Prior BP: 193/136 (08/13/2010)  Labs Reviewed: K+: 4.1 (08/13/2010) Creat: : 1.35 (08/13/2010)   LDL: 74  --  12/05/2008 (12/13/2008)     Problem # 2:  OVERWEIGHT (ICD-278.02) Assessment: Unchanged Patient was advised to watch "Fat, sick and nearly dead" for "some inspiration on weight loss strategies." Apparently he was also advised to drink some sort of weight loss detox fluid. This detox formular caused pt to become very dizzy. Regardless of these movies, and detox formulas, pt should stick to traditional form of weight loss, ie  exercise and diet management. Pt is young, healthy, with no other comorbidities, and thus he can exercise without difficulty. He too reports he has exercised in the past, and attributes his weight gain from "being lazy." I advised patient to go for a walk outside with his ipod vs joining the gym, as he mentioned he's not thrilled about the gym. Explained to patient that detox formulas and OTC weight loss supplements can be dangerous and to avoid this method of weight loss. Pt voiced understanding. We established a weight loss goal with 10 lbs in 2 months, followed by a total of 50 lbs in 1 year. Pt agreeable to this plan. Will continue to monitor patient's progress.   Complete Medication List: 1)  Coreg Cr 40 Mg Xr24h-cap (Carvedilol phosphate) .... Take 1 tablet by mouth once a day 2)  Benicar Hct 40-25 Mg Tabs (Olmesartan medoxomil-hctz) .... Take 1 tablet by mouth once a day 3)  Norvasc 10 Mg Tabs (Amlodipine besylate) .... Take 1 tablet by mouth once a day 4)  Coreg Cr 20 Mg Xr24h-cap (Carvedilol phosphate) .... Take 1 tablet by mouth once a day  Patient Instructions: 1)  Please schedule a follow-up appointment in 3 months. 2)  It is important that you exercise regularly at least 20 minutes 5 times a week. If you develop chest pain, have severe difficulty breathing, or feel very tired , stop exercising immediately and seek medical attention. 3)  You need to lose weight. Consider a lower calorie diet and regular exercise.   Prevention & Chronic Care Immunizations   Influenza vaccine: Not documented   Influenza vaccine deferral: Deferred  (08/13/2010)    Tetanus booster: Not documented   Td booster deferral: Refused  (06/18/2010)    Pneumococcal vaccine: Not documented  Other Screening   Smoking status: never  (08/23/2010)  Hypertension   Last Blood Pressure: 130 / 90  (08/23/2010)   Serum creatinine: 1.35  (08/13/2010)   BMP action: Ordered   Serum potassium 4.1  (08/13/2010)     Hypertension flowsheet reviewed?: Yes   Progress toward BP goal: Improved  Self-Management Support :   Personal Goals (by the next clinic visit) :      Personal blood pressure goal: 140/90  (01/03/2010)   Hypertension self-management support: Written self-care plan, Education handout, Resources for patients handout  (08/13/2010)

## 2011-02-12 ENCOUNTER — Encounter: Payer: Self-pay | Admitting: Internal Medicine

## 2011-02-15 ENCOUNTER — Other Ambulatory Visit: Payer: Self-pay | Admitting: Internal Medicine

## 2011-03-18 LAB — POCT CARDIAC MARKERS

## 2011-03-18 LAB — CATECHOLAMINES, FRACTIONATED, URINE, 24 HOUR

## 2011-03-18 LAB — CBC
HCT: 42.9 % (ref 39.0–52.0)
HCT: 43.7 % (ref 39.0–52.0)
Hemoglobin: 13.8 g/dL (ref 13.0–17.0)
Hemoglobin: 13.9 g/dL (ref 13.0–17.0)
Hemoglobin: 14.4 g/dL (ref 13.0–17.0)
MCHC: 33 g/dL (ref 30.0–36.0)
RBC: 4.97 MIL/uL (ref 4.22–5.81)
RBC: 5 MIL/uL (ref 4.22–5.81)
RDW: 14 % (ref 11.5–15.5)
RDW: 14 % (ref 11.5–15.5)
WBC: 5.9 10*3/uL (ref 4.0–10.5)

## 2011-03-18 LAB — COMPREHENSIVE METABOLIC PANEL
Alkaline Phosphatase: 52 U/L (ref 39–117)
BUN: 15 mg/dL (ref 6–23)
GFR calc non Af Amer: >60 mL/min (ref >60)
Glucose, Bld: 109 mg/dL — ABNORMAL HIGH (ref 70–99)
Potassium: 3.8 mEq/L (ref 3.5–5.1)
Total Protein: 7.6 g/dL (ref 6.0–8.3)

## 2011-03-18 LAB — BASIC METABOLIC PANEL
CO2: 28 mEq/L (ref 19–32)
Calcium: 9.2 mg/dL (ref 8.4–10.5)
Calcium: 9.3 mg/dL (ref 8.4–10.5)
Chloride: 103 mEq/L (ref 96–112)
GFR calc Af Amer: >60 mL/min (ref >60)
GFR calc non Af Amer: 53 mL/min — ABNORMAL LOW (ref >60)
Glucose, Bld: 88 mg/dL (ref 70–99)
Glucose, Bld: 94 mg/dL (ref 70–99)
Potassium: 4.1 mEq/L (ref 3.5–5.1)
Potassium: 4.5 mEq/L (ref 3.5–5.1)
Sodium: 136 mEq/L (ref 135–145)
Sodium: 138 mEq/L (ref 135–145)

## 2011-03-18 LAB — RAPID URINE DRUG SCREEN, HOSP PERFORMED
Barbiturates: NOT DETECTED
Benzodiazepines: NOT DETECTED

## 2011-03-18 LAB — MAGNESIUM: Magnesium: 2.2 mg/dL (ref 1.5–2.5)

## 2011-03-18 LAB — LIPID PANEL
Cholesterol: 122 mg/dL (ref 0–200)
LDL Cholesterol: 74 mg/dL (ref 0–99)
VLDL: 15 mg/dL (ref 0–40)

## 2011-03-18 LAB — CARDIAC PANEL(CRET KIN+CKTOT+MB+TROPI)
CK, MB: 1.6 ng/mL (ref 0.3–4.0)
CK, MB: 1.9 ng/mL (ref 0.3–4.0)
CK, MB: 1.9 ng/mL (ref 0.3–4.0)
CK, MB: 2 ng/mL (ref 0.3–4.0)
Relative Index: 0.6 (ref 0.0–2.5)
Relative Index: 0.7 (ref 0.0–2.5)
Relative Index: 0.8 (ref 0.0–2.5)
Total CK: 241 U/L — ABNORMAL HIGH (ref 7–232)
Total CK: 273 U/L — ABNORMAL HIGH (ref 7–232)
Troponin I: 0.03 ng/mL (ref 0.00–0.06)
Troponin I: 0.04 ng/mL (ref 0.00–0.06)

## 2011-03-18 LAB — URINALYSIS, ROUTINE W REFLEX MICROSCOPIC
Bilirubin Urine: NEGATIVE
Protein, ur: 30 mg/dL — AB
Urobilinogen, UA: 0.2 mg/dL (ref 0.0–1.0)

## 2011-03-18 LAB — DIFFERENTIAL
Basophils Absolute: 0 10*3/uL (ref 0.0–0.1)
Basophils Relative: 0 % (ref 0–1)
Monocytes Relative: 6 % (ref 3–12)
Neutro Abs: 2.3 10*3/uL (ref 1.7–7.7)
Neutrophils Relative %: 54 % (ref 43–77)

## 2011-03-18 LAB — CK TOTAL AND CKMB (NOT AT ARMC)
CK, MB: 2.9 ng/mL (ref 0.3–4.0)
Relative Index: 0.8 (ref 0.0–2.5)
Total CK: 370 U/L — ABNORMAL HIGH (ref 7–232)

## 2011-03-18 LAB — CREATININE, URINE, 24 HOUR
Creatinine, 24H Ur: 2982 mg/d — ABNORMAL HIGH (ref 800–2000)
Creatinine, Urine: 87.7 mg/dL
Urine Total Volume-UCRE24: 3400 mL

## 2011-03-18 LAB — METANEPHRINES, URINE, 24 HOUR
Metaneph Total, Ur: 606
Volume, Urine-METAN: 3400 mL

## 2011-03-18 LAB — PROTIME-INR
INR: 0.9 (ref 0.00–1.49)
Prothrombin Time: 12.7 seconds (ref 11.6–15.2)

## 2011-03-18 LAB — ETHANOL: Alcohol, Ethyl (B): <5 mg/dL (ref 0–10)

## 2011-03-18 LAB — BRAIN NATRIURETIC PEPTIDE: Pro B Natriuretic peptide (BNP): 48 pg/mL (ref 0.0–100.0)

## 2011-03-18 LAB — HEMOGLOBIN A1C: Hgb A1c MFr Bld: 5.5 % (ref 4.6–6.1)

## 2011-04-16 NOTE — Discharge Summary (Signed)
Thomas [name], Thomas [name] NO.:  [id number]   MEDICAL RECORD NO.:  [id number]          PATIENT TYPE:  INP   LOCATION:  2037                         FACILITY:  [facility]   PHYSICIAN:  Manning Charity, MD     DATE OF BIRTH:  07-06-1981   DATE OF ADMISSION:  12/04/2008  DATE OF DISCHARGE:  12/06/2008                               DISCHARGE SUMMARY   CONSULTANTS:  None.   DISCHARGE DIAGNOSES:  1. Heart palpitations secondary to paroxysmal supraventricular      tachycardia, resolved upon admission.  2. Severe uncontrolled hypertension.  3. History of hypertension, untreated prior to admission.  4. Hematuria on urinalysis.  5. Left ventricular hypertrophy  6. Acute renal insufficiency likely secondary to lisinopril   DISCHARGE MEDICATIONS:  1. P.o. Norvasc 5 mg daily.  2. P.o. clonidine 0.2 mg b.i.d.  3. P.o. hydrochlorothiazide 25 mg daily.  4. P.o. Coreg 12.5 mg b.i.d.   Please note the prescriptions for all medications were provided to the  patient.   CONDITION AT DISCHARGE:  Stable.  The patient had no arrhythmias or  tachycardia during his hospitalization and was asymptomatic upon  discharge.  His blood pressure had come down with treatment and he will  be sent home on multidrug therapy for his severe hypertension with  followup for a secondary etiology to the hypertension to continue on an  outpatient basis.  The patient is scheduled to follow up with myself,  Dr. Broadus John on December 15, 2008, at 2:30 p.m. in the Outpatient Clinic.  At that time, his hypertension and compliance with medications should be  evaluated.  Also, the patient will need an outpatient renal duplex scan  for evaluation of potential renovascular etiology to his hypertension.  The patient would also benefit from an outpatient sleep study, which can  be arranged upon followup.  The patient is also to have a stat BMET  drawn prior to his visit with Dr. Broadus John and assessment of his  creatinine  should be done at that time as well.   PROCEDURES:  1. The patient had a 2-D echo on December 05, 2008, that was remarkable      for a markedly increased left ventricular wall thickness.  The      question of concentric versus familial hypertrophic cardiomyopathy      was raised; however, there is no obstruction seen.  It is likely      that the patient's LVH is secondary to longstanding hypertension.      Right ventricular size and function was normal.  Pulmonary artery      was not well visualized.  The left atrium was normal in size.  2. CT head without contrast, no acute intracranial findings are      identified.  3. Chest x-ray, mild peribronchial thickening and a few streaky areas      of atelectases.  No infiltrates, edema, or effusions.   CONSULTATIONS:  None.   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  The patient had been  asymptomatic previously and the palpations did have a somewhat gradual  onset over a few minutes.  The patient denies any chest pain, dizziness,  presyncope/syncopal episodes.  The patient has no prior history of  palpitations, heart problems.  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  While in transport, the palpitations ceased and the  initial rhythm strip taken by EMS of regular narrow complex tachycardia  ceased.  The patient does not regularly drink alcohol but did have 3-4   drinks on New Year's Eve.  No drug use.  The patient had a recent cold  that has been resolving, but denies any decongestant use.   ADMISSION PHYSICAL EXAMINATION:  VITAL SIGNS:  Temperature 97.7, blood  pressure of 225/150 that came down to 176/97 with IV labetalol, pulse of  120 that came down to 92 with IV labetalol, respiratory rate is 16-20,  O2  sat is 97-98% on room air.  GENERAL:  No apparent distress.  EYES:  Extraocular muscles intact.  Pupils equal, round, and reactive to  light and accommodation.  ENT:  Moist membranous mucosa.  NECK:  Supple.  No lymphadenopathy.  RESPIRATORY:  Clear to auscultation bilaterally.  CARDIOVASCULAR:  Tachycardic, regular rate and rhythm.  No murmurs,  rubs, or gallops.  GI:  Obese, soft, nontender, nondistended.  EXTREMITIES:  No cyanosis, clubbing, or edema.  NEURO:  Nonfocal.  Cranial nerves grossly intact.  PSYCH:  Appropriate.   INITIAL LABORATORY DATA:  Sodium 139, potassium 3.8, chloride of 101,  bicarb of 28, BUN 15, creatinine 1.16.  Glucose 109.  White blood cell  count of 4.3, hemoglobin of 14.4, platelets of 169.  BNP of 48.0.  D-  dimer was 0.39.  INR was 0.90, PT of 12.7.  Magnesium 2.2.  Cardiac  enzymes were negative x6.  UA, large blood, 30 protein, specific gravity  of 1.060, otherwise, unremarkable.   HOSPITAL COURSE:  1. Heart palpitations secondary to supraventricular tachycardia:  The      patient experienced heart palpitations with associated shortness of      breath while hurriedly getting ready for work.  Upon transport to      the ED, his heart palpitations had resolved.  Fortunately, EMS was      able to capture a rhythm strip when the patient was having      palpitations and it revealed a regular narrow complex tachycardia      consistent with supraventricular tachycardia.  The patient remained      stable throughout the course of his hospitalization and he was      monitored on telemetry and had no ectopic  activity or arrhythmias.      The etiology for the tachycardia is at this point unknown.      However, on the patient's 2-D echo, it was shown that the patient      had severe left ventricular hypertrophy likely from uncontrolled      hypertension.  There was no dilation or abnormality of the left      atrium, which would be more expected with a narrow complex      tachycardia.  However, the LDH may be contributing to the patient's      presentation.  Regardless, the patient was started on Coreg 12.5      mg, which he will continue as an outpatient for rhythm control.  If      he develops further episodes of SVT, he has been instructed on      vagal maneuvers that may break this spell.  If it persists, he can      come to the ED for appropriate treatment.  The patient may benefit      from future referral to a cardiologist as an outpatient.  2. 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  His blood pressure      came down with therapy, although it required multidrug treatment      including Coreg 12.5 mg, Norvasc 5 mg, clonidine 0.2 mg b.i.d., and      hydrochlorothiazide 25 mg daily, all of which he will be discharged      on, and he is to follow up with in the Outpatient Clinic for blood      pressure check as well as tolerance to the medications.  He was      also instructed to obtain a blood pressure cuff and come to the [facility]      sooner if he has persistently elevated pressures. Secondary      etiologies for the hypertension were investigated including a 24-      hour urine metanephrine test, the results of which at this time are      pending.  The patient also had a renin and aldosterone level      checked, the results of both of which were also pending.  The      likelihood of a  pheochromocytoma is low given that the patient did      not have any preceding episodes of heart palpitations or headaches.      The patient's potassium was normal and stable throughout the course      of the hospitalization, making hyperaldosteronism less likely.  The      most likely etiology for the patient's hypertension if there is a      secondary cause would be renovascular disease.  Due to the      inability to obtain renal duplex in hospital, he will be scheduled      for a renal duplex scan as an outpatient with appropriate medical      followup.  3. Acute renal insufficiency:  The patient's baseline creatinine upon      admission was 1.15.  Upon beginning lisinopril and receiving 2      doses of this medication at 10 mg, his creatinine trended up to      1.58 after which his lisinopril was held.  At this time, we will      withhold lisinopril, but if he does return to the medication as an      outpatient, he will need close followup of his creatinine to assess  for acute renal insufficiency.  4. Hematuria:  The patient's initial UA was remarkable for large      number of blood along with 30 protein, otherwise negative.  The      micro only commented on rare squamous epithelial cells, there was      no comment of rbc's on the micro.  For this reason, a UA was      rechecked in the hospital, the results of which are pending.  The      patient may need followup as an outpatient if his UA does not clear      of the hemoglobin and should have a UA on December 15, 2008, when he      is to follow up with Dr. Broadus John.  If the patient has persistent      hematuria, the next step would be an abdominal CT scan.  If that is      unremarkable, referral to a urologist for possible cystoscopy would      be the next step.  5. Left ventricular hypertrophy:  The patient's echo was remarkable      for severe LVH, likely secondary to longstanding severely elevated      hypertension.  The  patient needs better control of his hypertension      and secondary causes of the hypertension, should be continued to be      investigated.  We will follow up with the patient in the Outpatient      Clinic for further investigated work and future referral to      Cardiology as an outpatient may be warranted.   DISCHARGE LABS AND VITALS:  Vitals:  Temperature 97.3, pulse is 71,  respiratory rate of 28, blood pressure of 152/103, O2 sat of 97% on room  air.  BMET, sodium 136, potassium 4.1, chloride of 100, bicarb of 29,  glucose of 88, BUN of 16, creatinine of 1.58, calcium of 9.2.  CBC the  following:  Hemoglobin 13.9, hematocrit 42.9, platelet count of 169,  white blood cell count of 4.5.   PENDING LABS:  Urine metanephrines, renin aldosterone level, UA are all  pending at this time and should be followed up in the Outpatient Clinic.  Please also schedule the patient for a renal duplex scan, outpatient  sleep study, and possible cardiology referral at his Outpatient Clinic  visit on December 15, 2008.      Linward Foster, MD  Electronically Signed      Manning Charity, MD  Electronically Signed    LW/MEDQ  D:  12/06/2008  T:  12/06/2008  Job:  [id number]

## 2011-04-16 NOTE — Procedures (Signed)
RENAL ARTERY DUPLEX EVALUATION   INDICATION:  Severe hypertension.   HISTORY:  Diabetes:  No.  Cardiac:  No.  Hypertension:  Patient states his blood pressure was over 200 while at  the hospital but since then it has been running about 150 after being  put on blood pressure medications.  Smoking:  No.   RENAL ARTERY DUPLEX FINDINGS:  Aorta-Proximal:  60 cm/s  Aorta-Mid:  99 cm/s  Aorta-Distal:  99 cm/s  Celiac Artery Origin:  Not visualized  SMA Origin:  235 cm/s                                    RIGHT               LEFT  Renal Artery Origin:             70 cm/s             Not visualized  Renal Artery Proximal:           Not visualized      56 cm/s  Renal Artery Mid:                51 cm/s             51 cm/s  Renal Artery Distal:             43 cm/s             44 cm/s  Hilar Acceleration Time (AT):  Renal-Aortic Ratio (RAR):        1.17                0.93  Kidney Size:                     12.0 cm             12.6 cm  End Diastolic Ratio (EDR):  Resistive Index (RI):            0.69                0.64   IMPRESSION:  1. Patent bilateral renal arteries with no evidence of stenosis noted,      based on limited visualization.  2. The bilateral kidney length measurements and resistive indices are      within normal limits.  3. Unable to adequately visualize the celiac and bilateral proximal      renal arteries due to overlying bowel gas patterns and patient body      habitus.   A preliminary report was faxed to Dr. Isaac Bliss office on 12/21/08.   ___________________________________________  Larina Earthly, M.D.   CH/MEDQ  D:  12/21/2008  T:  12/21/2008  Job:  [id number]

## 2012-12-07 ENCOUNTER — Emergency Department (HOSPITAL_COMMUNITY): Payer: 59

## 2012-12-07 ENCOUNTER — Encounter (HOSPITAL_COMMUNITY): Payer: Self-pay

## 2012-12-07 ENCOUNTER — Inpatient Hospital Stay (HOSPITAL_COMMUNITY)
Admission: EM | Admit: 2012-12-07 | Discharge: 2012-12-09 | DRG: 062 | Disposition: A | Discharge status: Rehabilitation Facility | Payer: 59 | Attending: Neurology | Admitting: Neurology

## 2012-12-07 ENCOUNTER — Inpatient Hospital Stay (HOSPITAL_COMMUNITY): Payer: 59

## 2012-12-07 DIAGNOSIS — N183 Chronic kidney disease, stage 3 unspecified: Secondary | ICD-10-CM | POA: Diagnosis present

## 2012-12-07 DIAGNOSIS — R4789 Other speech disturbances: Secondary | ICD-10-CM | POA: Diagnosis present

## 2012-12-07 DIAGNOSIS — E663 Overweight: Secondary | ICD-10-CM | POA: Diagnosis present

## 2012-12-07 DIAGNOSIS — Z6841 Body Mass Index (BMI) 40.0 and over, adult: Secondary | ICD-10-CM

## 2012-12-07 DIAGNOSIS — G8190 Hemiplegia, unspecified affecting unspecified side: Secondary | ICD-10-CM | POA: Diagnosis present

## 2012-12-07 DIAGNOSIS — I6340 Cerebral infarction due to embolism of unspecified cerebral artery: Principal | ICD-10-CM | POA: Diagnosis present

## 2012-12-07 DIAGNOSIS — I428 Other cardiomyopathies: Secondary | ICD-10-CM | POA: Diagnosis present

## 2012-12-07 DIAGNOSIS — E669 Obesity, unspecified: Secondary | ICD-10-CM

## 2012-12-07 DIAGNOSIS — I69359 Hemiplegia and hemiparesis following cerebral infarction affecting unspecified side: Secondary | ICD-10-CM | POA: Diagnosis present

## 2012-12-07 DIAGNOSIS — I517 Cardiomegaly: Secondary | ICD-10-CM | POA: Diagnosis present

## 2012-12-07 DIAGNOSIS — I4891 Unspecified atrial fibrillation: Secondary | ICD-10-CM | POA: Diagnosis present

## 2012-12-07 DIAGNOSIS — E876 Hypokalemia: Secondary | ICD-10-CM | POA: Diagnosis not present

## 2012-12-07 DIAGNOSIS — I10 Essential (primary) hypertension: Secondary | ICD-10-CM | POA: Diagnosis present

## 2012-12-07 DIAGNOSIS — R29810 Facial weakness: Secondary | ICD-10-CM | POA: Diagnosis present

## 2012-12-07 DIAGNOSIS — Z91199 Patient's noncompliance with other medical treatment and regimen due to unspecified reason: Secondary | ICD-10-CM

## 2012-12-07 DIAGNOSIS — Z9119 Patient's noncompliance with other medical treatment and regimen: Secondary | ICD-10-CM

## 2012-12-07 LAB — DIFFERENTIAL
Eosinophils Absolute: 0.1 10*3/uL (ref 0.0–0.7)
Eosinophils Relative: 2 % (ref 0–5)
Lymphocytes Relative: 45 % (ref 12–46)
Lymphs Abs: 2.8 10*3/uL (ref 0.7–4.0)
Monocytes Relative: 7 % (ref 3–12)
Neutrophils Relative %: 46 % (ref 43–77)

## 2012-12-07 LAB — COMPREHENSIVE METABOLIC PANEL
ALT: 47 U/L (ref 0–53)
Alkaline Phosphatase: 49 U/L (ref 39–117)
BUN: 20 mg/dL (ref 6–23)
CO2: 26 mEq/L (ref 19–32)
GFR calc Af Amer: 64 mL/min — ABNORMAL LOW (ref >90)
GFR calc non Af Amer: 55 mL/min — ABNORMAL LOW (ref >90)
Glucose, Bld: 128 mg/dL — ABNORMAL HIGH (ref 70–99)
Potassium: 2.8 mEq/L — ABNORMAL LOW (ref 3.5–5.1)
Sodium: 141 mEq/L (ref 135–145)
Total Protein: 7.4 g/dL (ref 6.0–8.3)

## 2012-12-07 LAB — POCT I-STAT, CHEM 8
Calcium, Ion: 1.09 mmol/L — ABNORMAL LOW (ref 1.12–1.23)
Chloride: 104 mEq/L (ref 96–112)
Glucose, Bld: 131 mg/dL — ABNORMAL HIGH (ref 70–99)

## 2012-12-07 LAB — CBC
Hemoglobin: 14.9 g/dL (ref 13.0–17.0)
MCH: 29.3 pg (ref 26.0–34.0)
MCV: 86.1 fL (ref 78.0–100.0)
RBC: 5.09 MIL/uL (ref 4.22–5.81)
WBC: 6.2 10*3/uL (ref 4.0–10.5)

## 2012-12-07 LAB — TROPONIN I: Troponin I: <0.3 ng/mL (ref <0.30)

## 2012-12-07 MED ORDER — ACETAMINOPHEN 650 MG RE SUPP: 650.0000 mg | RECTAL | Status: DC | PRN

## 2012-12-07 MED ORDER — DILTIAZEM HCL 25 MG/5ML IV SOLN
20.0000 mg | Freq: Once | INTRAVENOUS | Status: AC
  Administered 2012-12-07: 20 mg via INTRAVENOUS

## 2012-12-07 MED ORDER — LABETALOL HCL 5 MG/ML IV SOLN
20.0000 mg | Freq: Once | INTRAVENOUS | Status: AC
  Administered 2012-12-07: 20 mg via INTRAVENOUS

## 2012-12-07 MED ORDER — ONDANSETRON HCL 4 MG/2ML IJ SOLN: 4.0000 mg | Freq: Four times a day (QID) | INTRAMUSCULAR | Status: DC | PRN

## 2012-12-07 MED ORDER — ALTEPLASE (STROKE) FULL DOSE INFUSION
90.0000 mg | Freq: Once | INTRAVENOUS | Status: AC
  Administered 2012-12-07: 90 mg via INTRAVENOUS
  Filled 2012-12-07: qty 90

## 2012-12-07 MED ORDER — SODIUM CHLORIDE 0.9 % IV SOLN
INTRAVENOUS | Status: DC
  Administered 2012-12-07: 75 mL/h via INTRAVENOUS
  Administered 2012-12-08 (×2): via INTRAVENOUS

## 2012-12-07 MED ORDER — NICARDIPINE HCL IN NACL 20-0.86 MG/200ML-% IV SOLN
5.0000 mg/h | INTRAVENOUS | Status: DC
  Administered 2012-12-07: 5 mg/h via INTRAVENOUS
  Filled 2012-12-07 (×2): qty 200

## 2012-12-07 MED ORDER — ACETAMINOPHEN 325 MG PO TABS
650.0000 mg | ORAL_TABLET | ORAL | Status: DC | PRN
  Administered 2012-12-08 – 2012-12-09 (×2): 650 mg via ORAL
  Filled 2012-12-07 (×2): qty 2

## 2012-12-07 MED ORDER — LABETALOL HCL 5 MG/ML IV SOLN
INTRAVENOUS | Status: AC
  Filled 2012-12-07: qty 4

## 2012-12-07 MED ORDER — SENNOSIDES-DOCUSATE SODIUM 8.6-50 MG PO TABS
1.0000 | ORAL_TABLET | Freq: Every evening | ORAL | Status: DC | PRN
  Filled 2012-12-07: qty 1

## 2012-12-07 MED ORDER — DILTIAZEM HCL 100 MG IV SOLR
5.0000 mg/h | INTRAVENOUS | Status: DC
  Administered 2012-12-07 (×2): 10 mg/h via INTRAVENOUS
  Filled 2012-12-07 (×2): qty 100

## 2012-12-07 MED ORDER — PANTOPRAZOLE SODIUM 40 MG IV SOLR
40.0000 mg | Freq: Every day | INTRAVENOUS | Status: DC
  Administered 2012-12-07: 40 mg via INTRAVENOUS
  Filled 2012-12-07 (×2): qty 40

## 2012-12-07 MED ORDER — LABETALOL HCL 5 MG/ML IV SOLN
INTRAVENOUS | Status: AC
  Administered 2012-12-07: 10 mg via INTRAVENOUS
  Filled 2012-12-07: qty 4

## 2012-12-07 MED ORDER — LABETALOL HCL 5 MG/ML IV SOLN
10.0000 mg | Freq: Once | INTRAVENOUS | Status: AC
  Administered 2012-12-07: 10 mg via INTRAVENOUS

## 2012-12-07 MED ORDER — NICARDIPINE HCL IN NACL 20-0.86 MG/200ML-% IV SOLN
3.0000 mg/h | INTRAVENOUS | Status: DC
  Administered 2012-12-07: 3 mg/h via INTRAVENOUS
  Administered 2012-12-07: 5 mg/h via INTRAVENOUS
  Filled 2012-12-07 (×4): qty 200

## 2012-12-07 MED ORDER — DEXTROSE 5 % IV SOLN
5.0000 mg/h | INTRAVENOUS | Status: DC
  Administered 2012-12-07: 5 mg/h via INTRAVENOUS
  Filled 2012-12-07: qty 100

## 2012-12-07 NOTE — ED Provider Notes (Signed)
History     CSN: [id number]  Arrival date & time 12/07/12  1237   First MD Initiated Contact with Patient 12/07/12 1307      Chief Complaint  Patient presents with  . Code Stroke    (Consider location/radiation/quality/duration/timing/severity/associated sxs/prior treatment) HPI Comments: Thomas [name] is a 32 y.o. male patient was at home. Today, when he developed sudden weakness, and slurred speech. EMS arrived, and found him in a rapid heart rate, with new neurologic symptoms. He arrived to the emergency department in improved state. His weakness, and dysarthria, had resolved. Patient was texting on his phone, when he arrived. He denies headache, nausea, vomiting, fever, or chills. He has not had this previously.  Patient presented as a code stroke.  The history is provided by the patient.    Past Medical History  Diagnosis Date  . Cardiomyopathy 11/28/2010  . Palpitation 11/28/2010  . Atrial fibrillation 10/29/2010  . Benign essential hypertension 12/15/2008  . Paroxysmal SVT (supraventricular tachycardia) 12/15/2008  . 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 Renal insufficiency 11/28/2010  . Overweight 08/23/2010  . Hematuria, unspecified 12/15/2008    History reviewed. No pertinent past surgical history.  History reviewed. No pertinent family history.  History  Substance Use Topics  . Smoking status: Never Smoker   . Smokeless tobacco: Not on file  . Alcohol Use: Yes     Comment: <1/day      Review of Systems  All other systems reviewed and are negative.    Allergies  Review of patient's allergies indicates no known allergies.  Home Medications   No current outpatient prescriptions on file.  BP 152/85  Pulse 75  Temp 98.4 F (36.9 C)  Resp 24  Ht 6\' 1"  (1.854 m)  Wt 235 lb (106.595 kg)  BMI 31.00 kg/m2  SpO2 98%  Physical  Exam  Nursing note and vitals reviewed. Constitutional: He is oriented to person, place, and time. He appears well-developed and well-nourished.  HENT:  Head: Normocephalic and atraumatic.  Right Ear: External ear normal.  Left Ear: External ear normal.  Eyes: Conjunctivae normal and EOM are normal. Pupils are equal, round, and reactive to light.  Neck: Normal range of motion and phonation normal. Neck supple.  Cardiovascular: Normal rate, regular rhythm, normal heart sounds and intact distal pulses.   Pulmonary/Chest: Effort normal and breath sounds normal. He exhibits no bony tenderness.  Abdominal: Soft. Normal appearance. There is no tenderness.  Musculoskeletal: Normal range of motion.  Neurological: He is alert and oriented to person, place, and time. He has normal strength. No cranial nerve deficit or sensory deficit. He exhibits normal muscle tone. Coordination normal.       No gross strength abnormality on initial testing  Skin: Skin is warm, dry and intact.  Psychiatric: He has a normal mood and affect. His behavior is normal. Judgment and thought content normal.    ED Course  Procedures (including critical care time)   Patient seen initially at the bridge   Comanagement with Dr. Thad Ranger;  for atrial fibrillation with rapid ventricular response and hypertension. He was given Cardizem 20 mg IV bolus, and 10 mg per hour. Heart rate improved to 88, , still irregular, with blood pressure markedly elevated; 230/150.   Dr. Thad Ranger, ordered, labetalol, multiple doses.  Will consider using Cardene, if needed to control the blood pressure.    Date:  09/18/2012  Rate: 161  Rhythm: atrial fibrillation  QRS Axis: normal  PR and QT Intervals: normal QT  ST/T Wave abnormalities: nonspecific ST/T changes  PR and QRS Conduction Disutrbances:normal QT  Narrative Interpretation:   Old EKG Reviewed: changes noted- rate faster   CRITICAL CARE Performed by: Mancel Bale  L   Total critical care time: 30 minutes  Critical care time was exclusive of separately billable procedures and treating other patients.  Critical care was necessary to treat or prevent imminent or life-threatening deterioration.  Critical care was time spent personally by me on the following activities: development of treatment plan with patient and/or surrogate as well as nursing, discussions with consultants, evaluation of patient's response to treatment, examination of patient, obtaining history from patient or surrogate, ordering and performing treatments and interventions, ordering and review of laboratory studies, ordering and review of radiographic studies, pulse oximetry and re-evaluation of patient's condition.  Labs Reviewed  COMPREHENSIVE METABOLIC PANEL - Abnormal; Notable for the following:    Potassium 2.8 (*)     Glucose, Bld 128 (*)     Creatinine, Ser 1.62 (*)     AST 42 (*)     GFR calc non Af Amer 55 (*)     GFR calc Af Amer 64 (*)     All other components within normal limits  POCT I-STAT, CHEM 8 - Abnormal; Notable for the following:    Potassium 3.1 (*)     Creatinine, Ser 1.60 (*)     Glucose, Bld 131 (*)     Calcium, Ion 1.09 (*)     All other components within normal limits  PROTIME-INR  APTT  CBC  DIFFERENTIAL  TROPONIN I  HEMOGLOBIN A1C  LIPID PANEL   Ct Head (brain) Wo Contrast  12/07/2012  *RADIOLOGY REPORT*  Clinical Data: Sudden onset of left-sided weakness and slurred speech.  CT HEAD WITHOUT CONTRAST  Technique:  Contiguous axial images were obtained from the base of the skull through the vertex without contrast.  Comparison: 12/04/2008.  Findings: Exam is slightly motion degraded.  No obvious intracranial hemorrhage.  Mild indistinctness in the right periopercular region possibly representing findings of an acute right middle cerebral artery distribution infarct.  The right middle cerebral artery does not appear hyperdense.  Since prior  examination, small focal hypodensity left lenticular nucleus suggestive of remote infarct having occurred in the interim.  No intracranial mass lesion detected on this unenhanced exam.  No hydrocephalus.  Visualized sinuses, mastoid air cells and middle ear cavities are clear.  Mild exophthalmos.  IMPRESSION: Exam is slightly motion degraded.  No obvious intracranial hemorrhage.  Mild indistinctness in the right periopercular region possibly representing findings of an acute right middle cerebral artery distribution infarct.  The right middle cerebral artery does not appear hyperdense.  Since prior examination, small focal hypodensity left lenticular nucleus suggestive of remote infarct having occurred in the interim.  This is a code stroke with the results discussed with [facility] neurology physician's assistant 12/07/2012 1:10 p.m.   Original Report Authenticated By: Lacy Duverney, M.D.    Dg Chest Port 1 View  12/07/2012  *RADIOLOGY REPORT*  Clinical Data: Stroke  PORTABLE CHEST - 1 VIEW  Comparison: 12/04/2008  Findings: Mildly degraded exam due to AP portable technique and patient body habitus.  Numerous leads and wires project over the chest.  Midline trachea. Borderline cardiomegaly, accentuated by low lung volumes.  No right and no definite left pleural fluid.  The left costophrenic angle is  partially excluded. No pneumothorax.  No congestive failure.  Clear lungs.  IMPRESSION: No acute cardiopulmonary disease.  Decreased sensitivity and specificity exam due to technique related factors, as described above.   Original Report Authenticated By: Jeronimo Greaves, M.D.      1. Cerebral embolism with cerebral infarction   2. Atrial fibrillation   3. Hemiplegia, unspecified, affecting nondominant side       MDM  New onset rapid atrial fibrillation, with weakness and transient dysarthria, consistent with CVA. Patient meets criteria for a code stroke. Treatment  Assumed by neurology in the emergency department  for definitive treatment        Flint Melter, MD 12/07/12 1732

## 2012-12-07 NOTE — Progress Notes (Signed)
Pt had 90 sec episode of involutary rythmic facial twitching, equal on l and right. He was awake and conversing during this. MD notified, episode resolved.

## 2012-12-07 NOTE — ED Notes (Signed)
[place] [facility], Stroke RN stated not to do swallow screen at this time.

## 2012-12-07 NOTE — ED Notes (Signed)
Per ems- pt was at home, at 1120 began having left sided facial droop, weakness, and slurred speech. Symptoms began to resolve en route, but upon arrival, weakness, and slurred speech still noted. 20g IV placed in right hand. Bp-241/201 HR-120-200 o2-100% on 2L. Pt has hx of afib and htn, noncompliant with medications.

## 2012-12-07 NOTE — H&P (Signed)
Referring Physician: Effie Shy    Chief Complaint: Left sided weakness and slurred speech  HPI: Thomas [name] is an 32 y.o. male with a history of hypertension and atrial fibrillation who was at home and went to the bathroom with no complaints.  While in the bathroom developed slurred speech and left sided weakness.  EMS was called at that time and the patient was brought in as a code stroke.  While in the CT symptoms improved somewhat but patient did not return to baseline.  BP was markedly elevated.  Patient reported being noncompliant with his antihypertensive and ASA for 9 months.    LSN: [id number] tPA Given: Yes  Past Medical History  Diagnosis Date  . Cardiomyopathy 11/28/2010  . Palpitation 11/28/2010  . Atrial fibrillation 10/29/2010  . Benign essential hypertension 12/15/2008  . Paroxysmal SVT (supraventricular tachycardia) 12/15/2008  . 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 Renal insufficiency 11/28/2010  . Overweight 08/23/2010  . Hematuria, unspecified 12/15/2008    Past surgical history: No previous surgeries  Family history: Mother with hypertensin and diabetes.  Father with hypertension, diabetes and glaucoma.  Siblings alive and well.  No children.  Social History:  reports that he has never smoked. He does not have any smokeless tobacco history on file. He reports that he drinks alcohol. He reports that he does not use illicit drugs.  Allergies: No Known Allergies  Medications: I have reviewed the patient's current medications. Prior to Admission:  None  ROS: History obtained from the patient  General ROS: negative for - chills, fatigue, fever, night sweats, weight gain or weight loss Psychological ROS: negative for - behavioral disorder, hallucinations, memory difficulties, mood swings or suicidal ideation Ophthalmic ROS: negative for - blurry vision,  double vision, eye pain or loss of vision ENT ROS: negative for - epistaxis, nasal discharge, oral lesions, sore throat, tinnitus or vertigo Allergy and Immunology ROS: negative for - hives or itchy/watery eyes Hematological and Lymphatic ROS: negative for - bleeding problems, bruising or swollen lymph nodes Endocrine ROS: negative for - galactorrhea, hair pattern changes, polydipsia/polyuria or temperature intolerance Respiratory ROS: cough Cardiovascular ROS: negative for - chest pain, dyspnea on exertion, edema or irregular heartbeat Gastrointestinal ROS: negative for - abdominal pain, diarrhea, hematemesis, nausea/vomiting or stool incontinence Genito-Urinary ROS: negative for - dysuria, hematuria, incontinence or urinary frequency/urgency Musculoskeletal ROS: negative for - joint swelling or muscular weakness Neurological ROS: as noted in HPI Dermatological ROS: negative for rash and skin lesion changes  Physical Examination: Weight 106.595 kg (235 lb)., HR 88,  RR-18,  BP-212/132,  SpO2-97%  HEENT: Normocephalic.  No abrasions CV: Single S1, S2 Lungs: clear to auscultation Extremities: No edema  Neurologic Examination: Mental Status: Alert, oriented, thought content appropriate.  Speech fluent without evidence of aphasia.  Able to follow 3 step commands without difficulty.  Left neglect. Cranial Nerves: II: Discs flat bilaterally; Visual fields grossly normal, pupils equal, round, reactive to light and accommodation III,IV, VI: ptosis not present, extra-ocular motions intact bilaterally V,VII: decrease in left NLF, facial light touch sensation decreased on the left VIII: hearing normal bilaterally IX,X: gag reflex present XI: bilateral shoulder shrug XII: midline tongue extension Motor: Right : Upper extremity   5/5    Left:     Upper extremity   3+/5  Lower extremity   5/5     Lower extremity   4-/5 Tone  and bulk:normal tone throughout; no atrophy noted Sensory: Pinprick  and light touch decreased in the left lower extremity Deep Tendon Reflexes: 2+ and symmetric with absent AJ's bilaterally  Plantars: Right: downgoing   Left: downgoing Cerebellar: normal finger-to-nose and normal heel-to-shin test Gait: Unable to test CV: pulses palpable throughout    Laboratory Studies:  Basic Metabolic Panel:  Lab 12/07/12 1610  NA 142  K 3.1*  CL 104  CO2 --  GLUCOSE 131*  BUN 21  CREATININE 1.60*  CALCIUM --  MG --  PHOS --    Liver Function Tests: No results found for this basename: AST:5,ALT:5,ALKPHOS:5,BILITOT:5,PROT:5,ALBUMIN:5 in the last 168 hours No results found for this basename: LIPASE:5,AMYLASE:5 in the last 168 hours No results found for this basename: AMMONIA:3 in the last 168 hours  CBC:  Lab 12/07/12 1256  WBC --  NEUTROABS --  HGB 16.7  HCT 49.0  MCV --  PLT --    Cardiac Enzymes: No results found for this basename: CKTOTAL:5,CKMB:5,CKMBINDEX:5,TROPONINI:5 in the last 168 hours  BNP: No components found with this basename: POCBNP:5  CBG: No results found for this basename: GLUCAP:5 in the last 168 hours  Microbiology: No results found for this or any previous visit.  Coagulation Studies: No results found for this basename: LABPROT:5,INR:5 in the last 72 hours  Urinalysis: No results found for this basename: COLORURINE:2,APPERANCEUR:2,LABSPEC:2,PHURINE:2,GLUCOSEU:2,HGBUR:2,BILIRUBINUR:2,KETONESUR:2,PROTEINUR:2,UROBILINOGEN:2,NITRITE:2,LEUKOCYTESUR:2 in the last 168 hours  Lipid Panel:    Component Value Date/Time   CHOL  Value: 122        ATP III CLASSIFICATION:  <200     mg/dL   Desirable  960-454  mg/dL   Borderline High  >=098    mg/dL   High        12/02/9145 2035   TRIG 74 12/05/2008 2035   HDL 33* 12/05/2008 2035   CHOLHDL 3.7 12/05/2008 2035   VLDL 15 12/05/2008 2035   LDLCALC 74  --  12/05/2008 12/13/2008 1411    HgbA1C:  Lab Results  Component Value Date   HGBA1C  Value: 5.5 (NOTE)   The ADA recommends the  following therapeutic goal for glycemic   control related to Hgb A1C measurement:   Goal of Therapy:   < 7.0% Hgb A1C   Reference: American Diabetes Association: Clinical Practice   Recommendations 2008, Diabetes Care,  2008, 31:(Suppl 1). 12/04/2008    Urine Drug Screen:     Component Value Date/Time   LABOPIA NONE DETECTED 12/04/2008 2340   COCAINSCRNUR NONE DETECTED 12/04/2008 2340   LABBENZ NONE DETECTED 12/04/2008 2340   AMPHETMU NONE DETECTED 12/04/2008 2340   THCU NONE DETECTED 12/04/2008 2340   LABBARB  Value: NONE DETECTED        DRUG SCREEN FOR MEDICAL PURPOSES ONLY.  IF CONFIRMATION IS NEEDED FOR ANY PURPOSE, NOTIFY LAB WITHIN 5 DAYS.        LOWEST DETECTABLE LIMITS FOR URINE DRUG SCREEN Drug Class       Cutoff (ng/mL) Amphetamine      1000 Barbiturate      200 Benzodiazepine   200 Tricyclics       300 Opiates          300 Cocaine          300 THC              50 12/04/2008 2340    Alcohol Level: No results found for this basename: ETH:2 in the last 168 hours  Other results: EKG: afib/flutter  Imaging: Ct Head (  brain) Wo Contrast  12/07/2012  *RADIOLOGY REPORT*  Clinical Data: Sudden onset of left-sided weakness and slurred speech.  CT HEAD WITHOUT CONTRAST  Technique:  Contiguous axial images were obtained from the base of the skull through the vertex without contrast.  Comparison: 12/04/2008.  Findings: Exam is slightly motion degraded.  No obvious intracranial hemorrhage.  Mild indistinctness in the right periopercular region possibly representing findings of an acute right middle cerebral artery distribution infarct.  The right middle cerebral artery does not appear hyperdense.  Since prior examination, small focal hypodensity left lenticular nucleus suggestive of remote infarct having occurred in the interim.  No intracranial mass lesion detected on this unenhanced exam.  No hydrocephalus.  Visualized sinuses, mastoid air cells and middle ear cavities are clear.  Mild exophthalmos.  IMPRESSION:  Exam is slightly motion degraded.  No obvious intracranial hemorrhage.  Mild indistinctness in the right periopercular region possibly representing findings of an acute right middle cerebral artery distribution infarct.  The right middle cerebral artery does not appear hyperdense.  Since prior examination, small focal hypodensity left lenticular nucleus suggestive of remote infarct having occurred in the interim.  This is a code stroke with the results discussed with [facility] neurology physician's assistant 12/07/2012 1:10 p.m.   Original Report Authenticated By: Lacy Duverney, M.D.     Assessment: 32 y.o. male presenting with left sided weakness and numbness in atrial fibrillation with rapid ventricular rate.  BP markedly elevated.  Patient with no contraindications to tPA other than elevated blood pressure.  Attempts to be made for BP control.  Once controlled tPA to be administered.  Risks and benefits discussed with patient and verbal consent obtained.    BP controlled and heart rate controlled.  Patient required Cardizem infusion for rate control.  Labetalol given initially as well with no significant improvement in blood pressure.  Cardene infusion started with BP control obtained.  Once BP below target for two readings IV tPA administered.  Risk of bleeding again discussed.    Stroke Risk Factors - atrial fibrillation and hypertension  Plan: 1. HgbA1c, fasting lipid panel 2. MRI, MRA  of the brain without contrast 3. PT consult, OT consult, Speech consult 4. Echocardiogram 5. Carotid dopplers 6. Prophylactic therapy-None 7. Cardizem and Cardene infusions to be continued for BP and rate control.   8. Telemetry monitoring 9. Frequent neuro checks 10. Cardiology consult 11. Repeat head CT in 24 hours  This patient is critically ill and at significant risk of neurological worsening, death and care requires constant monitoring of vital signs, hemodynamics,respiratory and cardiac monitoring,  neurological assessment, discussion with family, other specialists and medical decision making of high complexity. I spent 90 minutes of neurocritical care time in the care of this patient.  Thana Farr, MD [facility] [phone] 12/07/2012  2:31 PM

## 2012-12-07 NOTE — ED Notes (Signed)
Cardene decreased to 5 mg/hr..blood pressure 139/83

## 2012-12-08 ENCOUNTER — Encounter (HOSPITAL_COMMUNITY): Payer: Self-pay | Admitting: Physical Medicine and Rehabilitation

## 2012-12-08 ENCOUNTER — Inpatient Hospital Stay (HOSPITAL_COMMUNITY): Payer: 59

## 2012-12-08 DIAGNOSIS — E876 Hypokalemia: Secondary | ICD-10-CM | POA: Diagnosis present

## 2012-12-08 DIAGNOSIS — I6340 Cerebral infarction due to embolism of unspecified cerebral artery: Principal | ICD-10-CM

## 2012-12-08 DIAGNOSIS — I4891 Unspecified atrial fibrillation: Secondary | ICD-10-CM | POA: Diagnosis present

## 2012-12-08 DIAGNOSIS — I10 Essential (primary) hypertension: Secondary | ICD-10-CM | POA: Diagnosis present

## 2012-12-08 LAB — LIPID PANEL
Cholesterol: 142 mg/dL (ref 0–200)
HDL: 44 mg/dL (ref >39)
LDL Cholesterol: 78 mg/dL (ref 0–99)
Triglycerides: 98 mg/dL (ref <150)

## 2012-12-08 LAB — HEMOGLOBIN A1C: Hgb A1c MFr Bld: 5.8 % — ABNORMAL HIGH (ref <5.7)

## 2012-12-08 MED ORDER — SPIRONOLACTONE 25 MG PO TABS
25.0000 mg | ORAL_TABLET | Freq: Every day | ORAL | Status: DC
  Administered 2012-12-08 – 2012-12-09 (×2): 25 mg via ORAL
  Filled 2012-12-08 (×2): qty 1

## 2012-12-08 MED ORDER — CARVEDILOL 12.5 MG PO TABS
12.5000 mg | ORAL_TABLET | Freq: Two times a day (BID) | ORAL | Status: DC
  Administered 2012-12-08: 12.5 mg via ORAL
  Filled 2012-12-08 (×4): qty 1

## 2012-12-08 MED ORDER — CARVEDILOL 25 MG PO TABS: 25.0000 mg | ORAL_TABLET | Freq: Two times a day (BID) | ORAL | Status: DC

## 2012-12-08 MED ORDER — HYDRALAZINE HCL 20 MG/ML IJ SOLN
10.0000 mg | INTRAMUSCULAR | Status: DC | PRN
  Administered 2012-12-09: 10 mg via INTRAVENOUS
  Filled 2012-12-08 (×2): qty 0.5

## 2012-12-08 MED ORDER — DILTIAZEM HCL ER COATED BEADS 240 MG PO CP24
240.0000 mg | ORAL_CAPSULE | Freq: Every day | ORAL | Status: DC
  Administered 2012-12-08 – 2012-12-09 (×2): 240 mg via ORAL
  Filled 2012-12-08 (×2): qty 1

## 2012-12-08 MED ORDER — CLONIDINE HCL 0.1 MG PO TABS
0.1000 mg | ORAL_TABLET | ORAL | Status: DC | PRN
  Filled 2012-12-08 (×2): qty 1

## 2012-12-08 MED ORDER — AMLODIPINE BESYLATE 10 MG PO TABS
10.0000 mg | ORAL_TABLET | Freq: Every day | ORAL | Status: DC
  Administered 2012-12-08: 10 mg via ORAL
  Filled 2012-12-08: qty 1

## 2012-12-08 MED ORDER — CARVEDILOL PHOSPHATE ER 80 MG PO CP24
80.0000 mg | ORAL_CAPSULE | Freq: Every day | ORAL | Status: DC
  Administered 2012-12-08: 80 mg via ORAL
  Filled 2012-12-08: qty 1

## 2012-12-08 MED ORDER — HYDROCHLOROTHIAZIDE 25 MG PO TABS
25.0000 mg | ORAL_TABLET | Freq: Every day | ORAL | Status: DC
  Administered 2012-12-08 – 2012-12-09 (×2): 25 mg via ORAL
  Filled 2012-12-08 (×2): qty 1

## 2012-12-08 MED ORDER — IRBESARTAN 300 MG PO TABS
300.0000 mg | ORAL_TABLET | Freq: Every day | ORAL | Status: DC
  Administered 2012-12-08 – 2012-12-09 (×2): 300 mg via ORAL
  Filled 2012-12-08 (×2): qty 1

## 2012-12-08 MED ORDER — NICARDIPINE HCL IN NACL 20-0.86 MG/200ML-% IV SOLN
3.0000 mg/h | INTRAVENOUS | Status: DC
  Administered 2012-12-08: 3 mg/h via INTRAVENOUS
  Filled 2012-12-08: qty 200

## 2012-12-08 MED ORDER — POTASSIUM CHLORIDE CRYS ER 20 MEQ PO TBCR
20.0000 meq | EXTENDED_RELEASE_TABLET | Freq: Every day | ORAL | Status: DC
  Administered 2012-12-09: 20 meq via ORAL
  Filled 2012-12-08: qty 1

## 2012-12-08 MED ORDER — SPIRONOLACTONE 12.5 MG HALF TABLET: 12.5000 mg | ORAL_TABLET | Freq: Every day | ORAL | Status: DC

## 2012-12-08 MED ORDER — RIVAROXABAN 20 MG PO TABS
20.0000 mg | ORAL_TABLET | Freq: Every day | ORAL | Status: DC
  Administered 2012-12-08: 20 mg via ORAL
  Filled 2012-12-08 (×2): qty 1

## 2012-12-08 MED ORDER — POTASSIUM CHLORIDE CRYS ER 20 MEQ PO TBCR
40.0000 meq | EXTENDED_RELEASE_TABLET | Freq: Two times a day (BID) | ORAL | Status: AC
  Administered 2012-12-08 (×2): 40 meq via ORAL
  Filled 2012-12-08 (×2): qty 2

## 2012-12-08 NOTE — Consult Note (Addendum)
CARDIOLOGY CONSULT NOTE   Patient ID: Thomas [name] MRN: [id number] DOB/AGE: [birth year] 31 y.o.  Admit date: 12/07/2012  Primary Physician   No primary provider on file. Primary Cardiologist   none Reason for Consultation   atrial fib  Thomas [name] is a 32 y.o. male with no history of CAD or arrhythmia.  He was seen as an outpatient in 2011 and was in atrial fibrillation, rate controlled. He was referred to cardiology, but did not make/keep an appointment.   He was admitted 12/07/2012 as a code stroke, and given tPA. The symptoms have improved but he is not back to baseline. He was in atrial fibrillation rapid ventricular response on admission with uncontrolled BP. His HR and BP have improved on medications listed below. He was off all medications for a prolonged period of time prior to admission.   He occasionally gets palpitations he describes as a nervous feeling. He never gets presyncope or syncope. He never gets chest pain. He can walk up a flight of steps without stopping, but it makes him short of breath. He denies lower extremity edema or PND. He describes orthopnea and has had an increase in his dyspnea on exertion recently, duration unclear. Currently, his heart rate is greater than 100, but he does not feel "nervous" and has no palpitations or shortness of breath, although he cannot lie flat.    Past Medical History  Diagnosis Date  . Cardiomyopathy 11/28/2010  . Palpitation 11/28/2010  . Atrial fibrillation 10/29/2010  . Benign essential hypertension 12/15/2008  . Paroxysmal SVT (supraventricular tachycardia) 12/15/2008  . 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 Renal insufficiency 11/28/2010  . Overweight 08/23/2010  . Hematuria, unspecified 12/15/2008     History reviewed. No pertinent past surgical history.  No Known Allergies  I have reviewed the  patient's current medications    . amLODipine  10 mg Oral Daily  . carvedilol  80 mg Oral Daily  . hydrochlorothiazide  25 mg Oral Daily  . irbesartan  300 mg Oral Daily  . pantoprazole (PROTONIX) IV  40 mg Intravenous QHS  . potassium chloride  40 mEq Oral BID   Followed by  . potassium chloride  20 mEq Oral Daily      . sodium chloride 75 mL/hr at 12/08/12 0900  . diltiazem (CARDIZEM) infusion 10 mg/hr (12/08/12 0900)  . niCARDipine 3 mg/hr (12/08/12 0947)   acetaminophen, acetaminophen, ondansetron (ZOFRAN) IV, senna-docusate  Prior to Admission medications   Medication Sig Start Date End Date Taking? Authorizing Provider  guaifenesin (MUCINEX) 600 MG 12 hr tablet Take 600 mg by mouth 2 (two) times daily as needed. For chest congestion   Yes Historical Provider, MD  Multiple Vitamin (MULTIVITAMIN WITH MINERALS) TABS Take 1 tablet by mouth daily.   Yes Historical Provider, MD     History   Social History  . Marital Status: Single    Spouse Name: N/A    Number of Children: N/A  . Years of Education: N/A   Occupational History  . Behavioral Health    Social History Main Topics  . Smoking status: Never Smoker   . Smokeless tobacco: Not on file  . Alcohol Use: Yes     Comment: <1/day  . Drug Use: No  . Sexually Active:    Other Topics Concern  . Not on file   Social History Narrative  Neither of his parents nor any siblings have any cardiac or heart rhythm issues. He has 2 grandparents that were on blood thinners but doesn't know why.    Family History  Problem Relation Age of Onset  . Hypertension Mother   . Hypertension Father   . Diabetes Mother   . Glaucoma Father      ROS: Left-sided weakness still present. Came in after becoming dizzy and weak in the bathroom, fell and EMS found him with neurological deficits and rapid atrial fib. Full 14 point review of systems complete and found to be negative unless listed above.  Physical Exam: Blood pressure  162/94, pulse 79, temperature 98.9 F (37.2 C), temperature source Oral, resp. rate 24, height 6\' 1"  (1.854 m), weight 340 lb (154.223 kg), SpO2 96.00%.  General: Well developed, well nourished, male in no acute distress Head: Eyes PERRLA, No xanthomas.   Normocephalic and atraumatic, oropharynx without edema or exudate. Dentition: good Lungs: few bilateral rales Heart: Heart irregular and rapid at times, with S1, S2; no murmur. pulses are 2+ all 4 extrem.   Neck: No carotid bruits. No lymphadenopathy.  JVD not elevated but difficult to assess secondary to body habitus. Abdomen: Bowel sounds present but decreased, abdomen soft and non-tender without masses or hernias noted. Msk:  No spine or cva tenderness. Left-sided weakness upper and lower extremities, no joint deformities or effusions. Extremities: No clubbing or cyanosis. No edema.  Neuro: Alert and oriented X 3. Focal deficits noted above. Psych:  Good affect, responds appropriately Skin: No rashes or lesions noted.  Labs:   Lab Results  Component Value Date   WBC 6.2 12/07/2012   HGB 16.7 12/07/2012   HCT 49.0 12/07/2012   MCV 86.1 12/07/2012   PLT 169 12/07/2012    Basename 12/07/12 1250  INR 1.02     Lab 12/07/12 1256 12/07/12 1250  NA 142 --  K 3.1* --  CL 104 --  CO2 -- 26  BUN 21 --  CREATININE 1.60* --  CALCIUM -- 8.9  PROT -- 7.4  BILITOT -- 0.7  ALKPHOS -- 49  ALT -- 47  AST -- 42*  GLUCOSE 131* --    Basename 12/07/12 1250  CKTOTAL --  CKMB --  TROPONINI <0.30    Lab Results  Component Value Date   CHOL 142 12/08/2012   HDL 44 12/08/2012   LDLCALC 78 12/08/2012   TRIG 98 12/08/2012    Echo: 12/05/2008 SUMMARY - Overall left ventricular systolic function was normal. Left ventricular ejection fraction was estimated , range being 55 % to 60 %. This study was inadequate for the evaluation of left ventricular regional wall motion. Left ventricular wall thickness was markedly increased (concentric - question  long standing hypertension versus familial hypertrophic cardiomyopathy. There was no obstruction).   ECG: 07-Dec-2012 13:01:56 [facility]/ED ROUTINE RECORD AGE IS NOT ENTERED, ASSUMED TO BE 32 YEARS OLD FOR PURPOSE OF ECG INTERPRETATION ATRIAL FIBRILLATION, V-RATE 107-152 ~ var'd rate, irreg atrial activity MULTIPLE VENTRICULAR PREMATURE COMPLEXES ~ V complexes w/ short R-R intervals PROBABLE LVH WITH SECONDARY REPOL ABNRM ~ R56L/RISIII/S12R56/S3RL & rep abn Standard 12 Lead Report ~ Unconfirmed Interpretation Abnormal ECG 84mm/s 67mm/mV 150Hz  8.0.1 12SL 235 CID: [id number] Referred by: Unconfirmed Vent. rate 161 BPM PR interval * ms QRS duration 96 ms QT/QTc 312/462 ms P-R-T axes * 31 176  Radiology:  Ct Head Wo Contrast 12/07/2012  *RADIOLOGY REPORT*  Clinical Data: Sudden onset left-sided weakness and slurred speech.  Atrial fibrillation.  Morbid obesity.  Seizure after TPA.  CT HEAD WITHOUT CONTRAST  Technique:  Contiguous axial images were obtained from the base of the skull through the vertex without contrast.  Comparison: 12/07/2012 CT head earlier in the day,  Findings: Motion degraded exam.  Developing hypodensities in the right posterior frontal opercular cortex extending deep toward the insular region and superior temporal lobe more obvious from priors (images 20 - 25, series 2). No visible hemorrhage.  No midline shift.  Calvarium intact.  IMPRESSION: No visible hemorrhage post t-PA.  Evolving cytotoxic edema suggests early right MCA territory infarct.   Original Report Authenticated By: Davonna Belling, M.D.    Ct Head (brain) Wo Contrast 12/07/2012  *RADIOLOGY REPORT*  Clinical Data: Sudden onset of left-sided weakness and slurred speech.  CT HEAD WITHOUT CONTRAST  Technique:  Contiguous axial images were obtained from the base of the skull through the vertex without contrast.  Comparison: 12/04/2008.  Findings: Exam is slightly motion degraded.  No obvious intracranial  hemorrhage.  Mild indistinctness in the right periopercular region possibly representing findings of an acute right middle cerebral artery distribution infarct.  The right middle cerebral artery does not appear hyperdense.  Since prior examination, small focal hypodensity left lenticular nucleus suggestive of remote infarct having occurred in the interim.  No intracranial mass lesion detected on this unenhanced exam.  No hydrocephalus.  Visualized sinuses, mastoid air cells and middle ear cavities are clear.  Mild exophthalmos.  IMPRESSION: Exam is slightly motion degraded.  No obvious intracranial hemorrhage.  Mild indistinctness in the right periopercular region possibly representing findings of an acute right middle cerebral artery distribution infarct.  The right middle cerebral artery does not appear hyperdense.  Since prior examination, small focal hypodensity left lenticular nucleus suggestive of remote infarct having occurred in the interim.  This is a code stroke with the results discussed with [facility] neurology physician's assistant 12/07/2012 1:10 p.m.   Original Report Authenticated By: Lacy Duverney, M.D.    Dg Chest Port 1 View 12/07/2012  *RADIOLOGY REPORT*  Clinical Data: Stroke  PORTABLE CHEST - 1 VIEW  Comparison: 12/04/2008  Findings: Mildly degraded exam due to AP portable technique and patient body habitus.  Numerous leads and wires project over the chest.  Midline trachea. Borderline cardiomegaly, accentuated by low lung volumes.  No right and no definite left pleural fluid.  The left costophrenic angle is partially excluded. No pneumothorax.  No congestive failure.  Clear lungs.  IMPRESSION: No acute cardiopulmonary disease.  Decreased sensitivity and specificity exam due to technique related factors, as described above.   Original Report Authenticated By: Jeronimo Greaves, M.D.     ASSESSMENT AND PLAN:   The patient was seen today by Dr Gala Romney, the patient evaluated and the data reviewed.     Atrial fibrillation with RVR - Rate control is improved with labetalol and Cardizem IV. He has been changed to Coreg 80 mg QD and the labetalol d/c'd. He has been started on amlodipine for his BP. MD advise on continuing both the amlodipine and the Cardizem. No anti-arrhythmics since duration unknown.   Will leave other issues to primary MD. Active Problems:  Cerebral embolism with cerebral infarction  Hemiplegia, unspecified, affecting nondominant side HTN, malignant, uncontrolled Hypokalemia Probable OSA Morbid obesity   Signed: Theodore Demark 12/08/2012, 11:30 AM Co-Sign MD   Patient seen and examined with Theodore Demark, PA-C. We discussed all aspects of the encounter. I agree with the assessment and plan as stated  above.   Thomas [name] is a 32 year old with chronic AF and a long h/o severe, uncontrolled HTN. He is now admitted with large R MCA stroke in setting of malignant HTN (SBP > 240) and AF with RVR. He is s/p t-PA. MRI shows large R MCA infarct.  HR and BP improved with VI cardizem and cardene. Now in process of transitioning to oral regimen. At this point we will switch IV diltiazem to po and stop cardene. We will adjust his other anti-HTN meds to include Benicar/HCTZ (worked for him in past), carvedilol and spironolactone. (Stop amlodipine with diltiazem on board). Goal SBP 150-170 for now. Can use PRN hydralazine or clonidine.  Will need anti-coagulation with coumadin or NOAC (I prefer NOAC if OK with Neuro and he can afford). Will also need echo and outpatient sleep study.  We will follow.   Addendum: Dr. Pearlean Brownie recommends Pradaxa. I agree. May be better off with Xarelto for compliance reasons (once a day).   Daniel Bensimhon,MD 1:49 PM

## 2012-12-08 NOTE — Progress Notes (Signed)
SLP Cancellation Note  Patient Details Name: [name] MRN: [id number] DOB: 23-Aug-1981   Cancelled treatment:       Reason Eval/Treat Not Completed: Fatigue/lethargy limiting ability to participate. Pt sleeping deeply, per RN stayed awake all night and was very tired this pm. SLP will return tomorrow for assessment of speech/language/cognition.   Harlon Ditty, MA CCC-SLP [phone]  Claudine Mouton 12/08/2012, 3:23 PM

## 2012-12-08 NOTE — Progress Notes (Signed)
Echocardiogram 2D Echocardiogram has been performed.  Ellender Hose A 12/08/2012, 4:22 PM

## 2012-12-08 NOTE — Progress Notes (Signed)
Agree with Cancellation.  [place], [place] DPT [phone]

## 2012-12-08 NOTE — Progress Notes (Signed)
VASCULAR LAB PRELIMINARY  PRELIMINARY  PRELIMINARY  PRELIMINARY  Carotid duplex completed.    Preliminary report:  Technically difficult due to respiratory interference - Snoring - and high bifurcations. Bilateral:  No obvious evidence of hemodynamically significant internal carotid artery stenosis.   Vertebral artery flow is antegrade.     Yevette Knust, RVS 12/08/2012, 12:27 PM

## 2012-12-08 NOTE — Progress Notes (Addendum)
Stroke Team Progress Note  HISTORY Thomas [name] is an 31 y.o. male with a history of hypertension and atrial fibrillation who was at home and went to the bathroom with no complaints. While in the bathroom developed slurred speech and left sided weakness. EMS was called at that time and the patient was brought in 12/07/2012 as a code stroke. While in the CT symptoms improved somewhat but patient did not return to baseline. BP was markedly elevated. Patient reported being noncompliant with his antihypertensive and ASA for 9 months. He received IV TPA. He was admitted to the neuro ICU for further evaluation and treatment.  SUBJECTIVE His fiance is at the bedside.  Overall he feels his condition is gradually improving.   OBJECTIVE Most recent Vital Signs: Filed Vitals:   12/08/12 0400 12/08/12 0500 12/08/12 0600 12/08/12 0700  BP: 159/82 152/93 140/88 136/97  Pulse: 67 67 69 69  Temp: 99.1 F (37.3 C)     TempSrc: Oral     Resp: 24 21 22 23   Height:      Weight:      SpO2: 100% 98% 98% 99%   IV Fluid Intake:     . sodium chloride 75 mL/hr at 12/08/12 0800  . diltiazem (CARDIZEM) infusion 10 mg/hr (12/08/12 0800)  . niCARDipine Stopped (12/08/12 0500)   MEDICATIONS    . pantoprazole (PROTONIX) IV  40 mg Intravenous QHS   PRN:  acetaminophen, acetaminophen, ondansetron (ZOFRAN) IV, senna-docusate  Diet:  Cardiac thin liquids Activity:  Bedrest DVT Prophylaxis:  SCDs  CLINICALLY SIGNIFICANT STUDIES Basic Metabolic Panel:  Lab 12/07/12 8119 12/07/12 1250  NA 142 141  K 3.1* 2.8*  CL 104 104  CO2 -- 26  GLUCOSE 131* 128*  BUN 21 20  CREATININE 1.60* 1.62*  CALCIUM -- 8.9  MG -- --  PHOS -- --   Liver Function Tests:  Lab 12/07/12 1250  AST 42*  ALT 47  ALKPHOS 49  BILITOT 0.7  PROT 7.4  ALBUMIN 3.7   CBC:  Lab 12/07/12 1256 12/07/12 1250  WBC -- 6.2  NEUTROABS -- 2.8  HGB 16.7 14.9  HCT 49.0 43.8  MCV -- 86.1  PLT -- 169   Coagulation:  Lab 12/07/12 1250    LABPROT 13.3  INR 1.02   Cardiac Enzymes:  Lab 12/07/12 1250  CKTOTAL --  CKMB --  CKMBINDEX --  TROPONINI <0.30   Urinalysis: No results found for this basename: COLORURINE:2,APPERANCEUR:2,LABSPEC:2,PHURINE:2,GLUCOSEU:2,HGBUR:2,BILIRUBINUR:2,KETONESUR:2,PROTEINUR:2,UROBILINOGEN:2,NITRITE:2,LEUKOCYTESUR:2 in the last 168 hours Lipid Panel    Component Value Date/Time   CHOL 142 12/08/2012 0424   TRIG 98 12/08/2012 0424   HDL 44 12/08/2012 0424   CHOLHDL 3.2 12/08/2012 0424   VLDL 20 12/08/2012 0424   LDLCALC 78 12/08/2012 0424   HgbA1C  Lab Results  Component Value Date   HGBA1C  Value: 5.5 (NOTE)   The ADA recommends the following therapeutic goal for glycemic   control related to Hgb A1C measurement:   Goal of Therapy:   < 7.0% Hgb A1C   Reference: American Diabetes Association: Clinical Practice   Recommendations 2008, Diabetes Care,  2008, 31:(Suppl 1). 12/04/2008    Urine Drug Screen:     Component Value Date/Time   LABOPIA NONE DETECTED 12/04/2008 2340   COCAINSCRNUR NONE DETECTED 12/04/2008 2340   LABBENZ NONE DETECTED 12/04/2008 2340   AMPHETMU NONE DETECTED 12/04/2008 2340   THCU NONE DETECTED 12/04/2008 2340   LABBARB  Value: NONE DETECTED        DRUG  SCREEN FOR MEDICAL PURPOSES ONLY.  IF CONFIRMATION IS NEEDED FOR ANY PURPOSE, NOTIFY LAB WITHIN 5 DAYS.        LOWEST DETECTABLE LIMITS FOR URINE DRUG SCREEN Drug Class       Cutoff (ng/mL) Amphetamine      1000 Barbiturate      200 Benzodiazepine   200 Tricyclics       300 Opiates          300 Cocaine          300 THC              50 12/04/2008 2340    Alcohol Level: No results found for this basename: ETH:2 in the last 168 hours  CT of the brain  12/07/2012   No visible hemorrhage post t-PA.  Evolving cytotoxic edema suggests early right MCA territory infarct.    12/07/2012 Exam is slightly motion degraded.  No obvious intracranial hemorrhage.  Mild indistinctness in the right periopercular region possibly representing findings of an acute right  middle cerebral artery distribution infarct.  The right middle cerebral artery does not appear hyperdense.  Since prior examination, small focal hypodensity left lenticular nucleus suggestive of remote infarct having occurred in the interim.   MRI of the brain    MRA of the brain    2D Echocardiogram    Carotid Doppler    CXR 12/07/2012 No acute cardiopulmonary disease.  Decreased sensitivity and specificity exam due to technique related factors.    EKG  atrial fibrillation  Therapy Recommendations   Physical Exam   Young african [place] male not in distress.Awake alert. Afebrile. Head is nontraumatic. Neck is supple without bruit. Hearing is normal. Cardiac exam no murmur or gallop. Lungs are clear to auscultation. Distal pulses are well felt.   Neurological Exam ; Awake alert oriented x 3 normal speech and language.Fundi not visualized. Vision acuity and fields appear normal. No lower face asymmetry. Tongue midline. Mild LUE drift. Mild diminished fine finger movements on left. Orbits right over left upper extremity. Mild left grip weak..Diminished left hemibody sensation . Normal coordination. Gait deferred.  NIHSS 2 ( improved from 6 on admission) ASSESSMENT [name] is a 32 y.o. male presenting with slurred speech and left sided weakness. Status post IV t-PA 12/07/2012 at 1425. 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 MRI pending. Infarct felt to be  embolic secondary to atrial fibrillation.  Work up underway. On no antiplatelets prior to admission. Now on no antiplatelets as within 24h of tpa for secondary stroke prevention. Patient with resultant left hemiparesis.  atrial fibrillation, followed by IM clinic at [facility]. Last seen in 2011 when they recommend card consult Hypertension, back on cardene drip this am SVT Ventricular hypertrophy Hypokalemia, 3.1 Renal insufficiency, Cr 1.6 Morbid obesity, Body mass index is 44.86 kg/(m^2).  Hospital day # 1  TREATMENT/PLAN  Add  pradaxa for secondary stroke prevention 24h after tPA if imaging negative for hemorrhage  MRI, MRA today. CT at 2p if unable to get MRI around that time  F/u Carotid doppler, 2D echo  Resume home BP medications as per IM clinic note in 2011. Wean cardene  OOB. Therapy evals. Rehab consult.  Cardiology consult  Replace K  Increase IVF. F/u Cr in am  Annie Main, MSN, RN, ANVP-BC, ANP-BC, [facility] Pager: [phone] 12/08/2012 8:07 AM This patient is critically ill and at significant risk of neurological worsening, death and care requires constant monitoring of vital signs,  hemodynamics,respiratory and cardiac monitoring,review of multiple databases, neurological assessment, discussion with family, other specialists and medical decision making of high complexity. I spent 30 minutes of neurocritical care time  in the care of  this patient.  I have personally obtained a history, examined the patient, evaluated imaging results, and formulated the assessment and plan of care. I agree with the above.   Delia Heady, MD Medical Director [facility] Pager: [phone] 12/08/2012 10:17 AM

## 2012-12-08 NOTE — Consult Note (Signed)
Physical Medicine and Rehabilitation Consult Reason for Consult: Left sided weakness and slurred speech Referring Physician:  Dr. Pearlean Brownie.    HPI: Thomas [name] is a 32 y.o. LH-male with history of CM, atrial fibrillation, morbid obesity, who was admitted on 12/07/12 with left sided weakness and slurred speech. BP markedly elevated 212/132 and patient with history of non compliance with antihypertensives and ASA for past 9 months. CT head with indistinctness in right periopercular region and once BP stabilized, he was treated with tPA. Follow up CCT with evolving cytotoxic edema suggesting early R-MCA infarct and no visible hemorrhage past tPA. Neurology following and workup underway. MD recommending CIR.  Review of Systems  HENT: Positive for hearing loss.   Eyes: Negative for blurred vision and double vision.  Respiratory: Positive for shortness of breath (with activity.). Negative for cough.        Reports allergies due to environmental exposure  Cardiovascular: Negative for chest pain and palpitations.  Gastrointestinal: Negative for heartburn, nausea and abdominal pain.  Musculoskeletal: Negative for myalgias and back pain.  Neurological: Positive for sensory change (left side.), focal weakness and headaches.  Psychiatric/Behavioral: The patient is not nervous/anxious and does not have insomnia.    Past Medical History  Diagnosis Date  . Cardiomyopathy 11/28/2010  . Palpitation 11/28/2010  . Atrial fibrillation 10/29/2010  . Benign essential hypertension 12/15/2008  . Paroxysmal SVT (supraventricular tachycardia) 12/15/2008  . 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 Renal insufficiency 11/28/2010  . Overweight 08/23/2010  . Hematuria, unspecified 12/15/2008   History reviewed. No pertinent past surgical history.  History reviewed. No pertinent family  history.  Social History:  Lives alone. 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 Girlfriend and family live OOT.  he reports that he has never smoked. He does not have any smokeless tobacco history on file. He reports that he drinks alcohol--mixed drink once a month.  He reports that he does not use illicit drugs.  Allergies: No Known Allergies  Medications Prior to Admission  Medication Sig Dispense Refill  . guaiFENesin (MUCINEX) 600 MG 12 hr tablet Take 600 mg by mouth 2 (two) times daily as needed. For chest congestion      . Multiple Vitamin (MULTIVITAMIN WITH MINERALS) TABS Take 1 tablet by mouth daily.        Home:    Functional History:   Functional Status:  Mobility:          ADL:    Cognition: Cognition Orientation Level: Oriented X4    Blood pressure 162/94, pulse 79, temperature 98.9 F (37.2 C), temperature source Oral, resp. rate 24, height 6\' 1"  (1.854 m), weight 154.223 kg (340 lb), SpO2 96.00%.  Physical Exam  Constitutional: He is oriented to person, place, and time. He appears well-developed and well-nourished.       Morbidly obese  HENT:  Head: Normocephalic and atraumatic.  Eyes: Pupils are equal, round, and reactive to light.  Neck: Normal range of motion.  Cardiovascular: Normal rate and regular rhythm.   Pulmonary/Chest: Effort normal and breath sounds normal.  Abdominal: Soft. Bowel sounds are normal.  Musculoskeletal: He exhibits edema (1+ Left hand).  Neurological: He is alert and oriented to person, place, and time.       Speech clear. Follows commands without difficulty. Left facial droop (mild). Mild sensory loss left face, arm, leg.  LUE grossly 3/5. LLE 3 to 3+. Good insight and awareness. No tone, DTR's 1+, mild inattention to the left  Skin: Skin is warm and dry.  Psychiatric: He has a normal mood and affect. His behavior is normal. Judgment and thought content normal. Cognition and memory are  normal.    Results for orders placed during the hospital encounter of 12/07/12 (from the past 24 hour(s))  PROTIME-INR     Status: Normal   Collection Time   12/07/12 12:50 PM      Component Value Range   Prothrombin Time 13.3  11.6 - 15.2 seconds   INR 1.02  0.00 - 1.49  APTT     Status: Normal   Collection Time   12/07/12 12:50 PM      Component Value Range   aPTT 29  24 - 37 seconds  CBC     Status: Normal   Collection Time   12/07/12 12:50 PM      Component Value Range   WBC 6.2  4.0 - 10.5 K/uL   RBC 5.09  4.22 - 5.81 MIL/uL   Hemoglobin 14.9  13.0 - 17.0 g/dL   HCT 16.1  09.6 - 04.5 %   MCV 86.1  78.0 - 100.0 fL   MCH 29.3  26.0 - 34.0 pg   MCHC 34.0  30.0 - 36.0 g/dL   RDW 40.9  81.1 - 91.4 %   Platelets 169  150 - 400 K/uL  DIFFERENTIAL     Status: Normal   Collection Time   12/07/12 12:50 PM      Component Value Range   Neutrophils Relative 46  43 - 77 %   Neutro Abs 2.8  1.7 - 7.7 K/uL   Lymphocytes Relative 45  12 - 46 %   Lymphs Abs 2.8  0.7 - 4.0 K/uL   Monocytes Relative 7  3 - 12 %   Monocytes Absolute 0.4  0.1 - 1.0 K/uL   Eosinophils Relative 2  0 - 5 %   Eosinophils Absolute 0.1  0.0 - 0.7 K/uL   Basophils Relative 0  0 - 1 %   Basophils Absolute 0.0  0.0 - 0.1 K/uL  COMPREHENSIVE METABOLIC PANEL     Status: Abnormal   Collection Time   12/07/12 12:50 PM      Component Value Range   Sodium 141  135 - 145 mEq/L   Potassium 2.8 (*) 3.5 - 5.1 mEq/L   Chloride 104  96 - 112 mEq/L   CO2 26  19 - 32 mEq/L   Glucose, Bld 128 (*) 70 - 99 mg/dL   BUN 20  6 - 23 mg/dL   Creatinine, Ser 7.82 (*) 0.50 - 1.35 mg/dL   Calcium 8.9  8.4 - 95.6 mg/dL   Total Protein 7.4  6.0 - 8.3 g/dL   Albumin 3.7  3.5 - 5.2 g/dL   AST 42 (*) 0 - 37 U/L   ALT 47  0 - 53 U/L   Alkaline Phosphatase 49  39 - 117 U/L   Total Bilirubin 0.7  0.3 - 1.2 mg/dL   GFR calc non Af Amer 55 (*) >90 mL/min   GFR calc Af Amer 64 (*) >90 mL/min  TROPONIN I     Status: Normal   Collection Time    12/07/12 12:50 PM      Component Value Range   Troponin I <0.30  <0.30 ng/mL  POCT I-STAT, CHEM 8  Status: Abnormal   Collection Time   12/07/12 12:56 PM      Component Value Range   Sodium 142  135 - 145 mEq/L   Potassium 3.1 (*) 3.5 - 5.1 mEq/L   Chloride 104  96 - 112 mEq/L   BUN 21  6 - 23 mg/dL   Creatinine, Ser 1.61 (*) 0.50 - 1.35 mg/dL   Glucose, Bld 096 (*) 70 - 99 mg/dL   Calcium, Ion 0.45 (*) 1.12 - 1.23 mmol/L   TCO2 26  0 - 100 mmol/L   Hemoglobin 16.7  13.0 - 17.0 g/dL   HCT 40.9  81.1 - 91.4 %  LIPID PANEL     Status: Normal   Collection Time   12/08/12  4:24 AM      Component Value Range   Cholesterol 142  0 - 200 mg/dL   Triglycerides 98  <782 mg/dL   HDL 44  >95 mg/dL   Total CHOL/HDL Ratio 3.2     VLDL 20  0 - 40 mg/dL   LDL Cholesterol 78  0 - 99 mg/dL   Ct Head Wo Contrast  12/07/2012  *RADIOLOGY REPORT*  Clinical Data: Sudden onset left-sided weakness and slurred speech. Atrial fibrillation.  Morbid obesity.  Seizure after TPA.  CT HEAD WITHOUT CONTRAST  Technique:  Contiguous axial images were obtained from the base of the skull through the vertex without contrast.  Comparison: 12/07/2012 CT head earlier in the day,  Findings: Motion degraded exam.  Developing hypodensities in the right posterior frontal opercular cortex extending deep toward the insular region and superior temporal lobe more obvious from priors (images 20 - 25, series 2). No visible hemorrhage.  No midline shift.  Calvarium intact.  IMPRESSION: No visible hemorrhage post t-PA.  Evolving cytotoxic edema suggests early right MCA territory infarct.   Original Report Authenticated By: Davonna Belling, M.D.    Ct Head (brain) Wo Contrast  12/07/2012  *RADIOLOGY REPORT*  Clinical Data: Sudden onset of left-sided weakness and slurred speech.  CT HEAD WITHOUT CONTRAST  Technique:  Contiguous axial images were obtained from the base of the skull through the vertex without contrast.  Comparison: 12/04/2008.   Findings: Exam is slightly motion degraded.  No obvious intracranial hemorrhage.  Mild indistinctness in the right periopercular region possibly representing findings of an acute right middle cerebral artery distribution infarct.  The right middle cerebral artery does not appear hyperdense.  Since prior examination, small focal hypodensity left lenticular nucleus suggestive of remote infarct having occurred in the interim.  No intracranial mass lesion detected on this unenhanced exam.  No hydrocephalus.  Visualized sinuses, mastoid air cells and middle ear cavities are clear.  Mild exophthalmos.  IMPRESSION: Exam is slightly motion degraded.  No obvious intracranial hemorrhage.  Mild indistinctness in the right periopercular region possibly representing findings of an acute right middle cerebral artery distribution infarct.  The right middle cerebral artery does not appear hyperdense.  Since prior examination, small focal hypodensity left lenticular nucleus suggestive of remote infarct having occurred in the interim.  This is a code stroke with the results discussed with [facility] neurology physician's assistant 12/07/2012 1:10 p.m.   Original Report Authenticated By: Lacy Duverney, M.D.    Dg Chest Port 1 View  12/07/2012  *RADIOLOGY REPORT*  Clinical Data: Stroke  PORTABLE CHEST - 1 VIEW  Comparison: 12/04/2008  Findings: Mildly degraded exam due to AP portable technique and patient body habitus.  Numerous leads and wires project over the  chest.  Midline trachea. Borderline cardiomegaly, accentuated by low lung volumes.  No right and no definite left pleural fluid.  The left costophrenic angle is partially excluded. No pneumothorax.  No congestive failure.  Clear lungs.  IMPRESSION: No acute cardiopulmonary disease.  Decreased sensitivity and specificity exam due to technique related factors, as described above.   Original Report Authenticated By: Jeronimo Greaves, M.D.     Assessment/Plan: Diagnosis: right MCA  infarct, ?embolic 1. Does the need for close, 24 hr/day medical supervision in concert with the patient's rehab needs make it unreasonable for this patient to be served in a less intensive setting? Potentially 2. Co-Morbidities requiring supervision/potential complications: htn, afib, cm 3. Due to bladder management, bowel management, safety, skin/wound care, disease management, medication administration, pain management and patient education, does the patient require 24 hr/day rehab nursing? Potentially 4. Does the patient require coordinated care of a physician, rehab nurse, PT (1-2 hrs/day, 5 days/week) and OT (1-2 hrs/day, 5 days/week) to address physical and functional deficits in the context of the above medical diagnosis(es)? Potentially Addressing deficits in the following areas: balance, endurance, locomotion, strength, transferring, bowel/bladder control, bathing, dressing, feeding, grooming, toileting and psychosocial support 5. Can the patient actively participate in an intensive therapy program of at least 3 hrs of therapy per day at least 5 days per week? Potentially 6. The potential for patient to make measurable gains while on inpatient rehab is good 7. Anticipated functional outcomes upon discharge from inpatient rehab are ?supervision with PT, ?supervision with OT, n/a with SLP. 8. Estimated rehab length of stay to reach the above functional goals is: ?1-2 weeks 9. Does the patient have adequate social supports to accommodate these discharge functional goals? Yes and Potentially 10. Anticipated D/C setting: Home 11. Anticipated post D/C treatments: Outpt therapy 12. Overall Rehab/Functional Prognosis: good  RECOMMENDATIONS: This patient's condition is appropriate for continued rehabilitative care in the following setting: CIR Patient has agreed to participate in recommended program. Potentially Note that insurance prior authorization may be required for reimbursement for  recommended care.  Comment:Still quite early in the hospital course. Likely would benefit from a rehab admit, however, based upon his clinical findings. Rehab RN to follow up.   Ivory Broad, MD     12/08/2012

## 2012-12-08 NOTE — Evaluation (Signed)
Physical Therapy Evaluation Patient Details Name: Thomas [name] MRN: [id number] DOB: [birth year] Today's Date: 12/08/2012 Time: 1308-6578 PT Time Calculation (min): 38 min  PT Assessment / Plan / Recommendation Clinical Impression  Pt is 32 y/o male admitted for left sided weakness and slurred speech.  MRI showed right MCA.  Pt with left sided weakness UE > LE, impaired sensation, mobility and gait.  Pt will benefit from further acute PT servcies and recommending inpatient rehab for further therapy prior to d/c home.    PT Assessment  Patient needs continued PT services    Follow Up Recommendations  CIR    Barriers to Discharge  (Need to further assess caregiver support; )      Equipment Recommendations   (will continue to assess)    Recommendations for Other Services Rehab consult   Frequency Min 4X/week    Precautions / Restrictions Precautions Precautions: Fall   Pertinent Vitals/Pain No c/o pain; vitals stable throughout      Mobility  Bed Mobility Bed Mobility: Supine to Sit Supine to Sit: 3: Mod assist;With rails Sit to Supine: 3: Mod assist Details for Bed Mobility Assistance: (A) to elevate trunk OOB and (A) with LE into bed.  Pt able to use momentum with trunk to assist movement in/out of bed. Transfers Transfers: Sit to Stand;Stand to Sit Sit to Stand: 1: +2 Total assist;From bed Sit to Stand: Patient Percentage: 40% Stand to Sit: 1: +2 Total assist;To bed Stand to Sit: Patient Percentage: 40% Details for Transfer Assistance: +2 (A) to initiate transfer and slowly descend to bed with max cues for hand placement and (A) to prevent left LE buckling.   Ambulation/Gait Ambulation/Gait Assistance: Not tested (comment);Other (comment) (Attempted to take side steps but unable to advance LE) Stairs: No Wheelchair Mobility Wheelchair Mobility: No Modified Rankin (Stroke Patients Only) Pre-Morbid Rankin Score: No symptoms Modified Rankin: Severe disability     PT  Diagnosis: Difficulty walking;Abnormality of gait;Generalized weakness  PT Problem List: Decreased strength;Decreased activity tolerance;Decreased balance;Decreased mobility;Decreased coordination;Decreased knowledge of use of DME;Decreased safety awareness;Decreased cognition;Impaired sensation PT Treatment Interventions: DME instruction;Gait training;Functional mobility training;Therapeutic activities;Therapeutic exercise;Balance training;Neuromuscular re-education;Cognitive remediation;Patient/family education   PT Goals Acute Rehab PT Goals PT Goal Formulation: With patient Time For Goal Achievement: 12/22/12 Potential to Achieve Goals: Good Pt will go Supine/Side to Sit: with supervision PT Goal: Supine/Side to Sit - Progress: Goal set today Pt will Sit at Edge of Bed: with supervision;1-2 min PT Goal: Sit at Edge Of Bed - Progress: Goal set today Pt will go Sit to Supine/Side: with supervision PT Goal: Sit to Supine/Side - Progress: Goal set today Pt will go Sit to Stand: with mod assist PT Goal: Sit to Stand - Progress: Goal set today Pt will go Stand to Sit: with mod assist PT Goal: Stand to Sit - Progress: Goal set today Pt will Transfer Bed to Chair/Chair to Bed: with +2 total assist (70%) PT Transfer Goal: Bed to Chair/Chair to Bed - Progress: Goal set today Pt will Stand: with mod assist;1 - 2 min PT Goal: Stand - Progress: Goal set today Pt will Ambulate: 1 - 15 feet;with +2 total assist;with least restrictive assistive device (60%) PT Goal: Ambulate - Progress: Goal set today  Visit Information  Last PT Received On: 12/08/12 Assistance Needed: +2    Subjective Data  Subjective: "I didn't sleep good last night." Patient Stated Goal: To be independent again and return to work.   Prior Functioning  Home Living Lives With: Alone  Type of Home: Apartment Home Access: Stairs to enter [employer] of Steps: 16 Entrance Stairs-Rails: Right;Left;Can reach  both Home Layout: One level Bathroom Shower/Tub: Engineer, manufacturing systems: Standard Bathroom Accessibility: Yes How Accessible: Accessible via walker Home Adaptive Equipment: None Additional Comments: Need to further assess caregiver support.  Girlfriend from [place] and family currently on there way from [place], [place]. 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: No difficulties Dominant Hand: Right    Cognition  Overall Cognitive Status: Impaired Area of Impairment: Attention;Safety/judgement;Problem solving Arousal/Alertness: Lethargic Orientation Level: Appears intact for tasks assessed Behavior During Session: Lethargic Current Attention Level: Sustained Attention - Other Comments: Lethargic decrease attention Safety/Judgement: Decreased awareness of safety precautions (difficult to assess; pt unware of all deficits) Problem Solving: Pt slow to process and needs extra time to process    Extremity/Trunk Assessment Right Upper Extremity Assessment RUE ROM/Strength/Tone: Within functional levels Left Upper Extremity Assessment LUE ROM/Strength/Tone: Deficits LUE ROM/Strength/Tone Deficits: 2/5 shoulder flexion LUE Sensation: Deficits LUE Sensation Deficits: Impaired touch/pain LUE Coordination: Deficits LUE Coordination Deficits: Noticeable impaired coordination with ataxic like movement with UE Right Lower Extremity Assessment RLE ROM/Strength/Tone: Within functional levels RLE Sensation: WFL - Light Touch Left Lower Extremity Assessment LLE ROM/Strength/Tone: Deficits LLE ROM/Strength/Tone Deficits: 3/5 quad, 2+/5 hamstring, 2+/5 hip flexors LLE Sensation: Deficits LLE Sensation Deficits: impaired light touch/pain LLE Coordination: Deficits LLE Coordination Deficits: Impaired coordination   Balance  Balance Balance Assessed: Yes Static Sitting Balance Static Sitting - Balance Support: Feet supported Static Sitting - Level of Assistance: 5: Stand by assistance Static Standing Balance Static Standing - Balance Support: Bilateral upper extremity supported Static Standing - Level of Assistance: 1: +2 Total assist;Patient percentage (comment) (40%) Static Standing - Comment/# of Minutes: +2 (A) to maintain balance and promote upright posture and maintain midline due to severe left sided and forward lean. Dynamic Standing Balance Dynamic Standing - Balance Support: Bilateral upper extremity supported Dynamic Standing - Level of Assistance: 1: +2 Total assist (50%) Dynamic Standing - Balance Activities: Lateral lean/weight shifting;Forward lean/weight shifting (attempted side step but unable to complete)  End of Session PT - End of Session Equipment Utilized During Treatment: Gait belt Activity Tolerance: Patient limited by fatigue Patient left: in bed;with call bell/phone within reach;with nursing in room Nurse Communication: Mobility status  GP     Rece Zechman 12/08/2012, 3:35 PM Jake Shark, PT DPT [phone]

## 2012-12-08 NOTE — Progress Notes (Signed)
MRI with right MCA stroke, no hemorrhage. Will start xarelto per Dr. Jones Broom recommendations.  Now off IV caridizem. Cardene off for past few hours and BP 165. Will write order to transfer to the floor. Discussed with Rosalita Chessman, RN.  Annie Main, MSN, RN, ANVP-BC, ANP-BC, GNP-BC [facility] Pager: [phone] 12/08/2012 4:13 PM

## 2012-12-08 NOTE — Progress Notes (Signed)
Rehab Admissions Coordinator Note:  Patient was screened by Trish Mage for appropriateness for an Inpatient Acute Rehab Consult.  An inpatient rehab consult was completed by Dr. Riley Kill today.  Call me for questions.  Trish Mage 12/08/2012, 4:14 PM  I can be reached at [phone].

## 2012-12-08 NOTE — Progress Notes (Signed)
OT Cancellation Note  Patient Details Name: Thomas [name] MRN: [id number] DOB: July 06, 1981   Cancelled Treatment:    Reason Eval/Treat Not Completed: Patient at procedure or test/ unavailable pt currently at MRI. OT / PT to reattempt evaluation at later date and time.  Harrel Carina BrynnPager: [phone]  12/08/2012, 11:46 AM

## 2012-12-09 ENCOUNTER — Inpatient Hospital Stay (HOSPITAL_COMMUNITY)
Admission: RE | Admit: 2012-12-09 | Discharge: 2012-12-18 | DRG: 945 | Disposition: A | Discharge status: Home / Self Care | Payer: 59 | Source: Ambulatory Visit | Attending: Physical Medicine & Rehabilitation | Admitting: Physical Medicine & Rehabilitation

## 2012-12-09 DIAGNOSIS — Z9114 Patient's other noncompliance with medication regimen: Secondary | ICD-10-CM

## 2012-12-09 DIAGNOSIS — I6340 Cerebral infarction due to embolism of unspecified cerebral artery: Secondary | ICD-10-CM

## 2012-12-09 DIAGNOSIS — Z8249 Family history of ischemic heart disease and other diseases of the circulatory system: Secondary | ICD-10-CM

## 2012-12-09 DIAGNOSIS — Z5189 Encounter for other specified aftercare: Principal | ICD-10-CM

## 2012-12-09 DIAGNOSIS — Z91148 Patient's other noncompliance with medication regimen for other reason: Secondary | ICD-10-CM

## 2012-12-09 DIAGNOSIS — R4789 Other speech disturbances: Secondary | ICD-10-CM

## 2012-12-09 DIAGNOSIS — Z7901 Long term (current) use of anticoagulants: Secondary | ICD-10-CM

## 2012-12-09 DIAGNOSIS — I639 Cerebral infarction, unspecified: Secondary | ICD-10-CM

## 2012-12-09 DIAGNOSIS — E669 Obesity, unspecified: Secondary | ICD-10-CM

## 2012-12-09 DIAGNOSIS — G8190 Hemiplegia, unspecified affecting unspecified side: Secondary | ICD-10-CM

## 2012-12-09 DIAGNOSIS — N179 Acute kidney failure, unspecified: Secondary | ICD-10-CM

## 2012-12-09 DIAGNOSIS — I10 Essential (primary) hypertension: Secondary | ICD-10-CM

## 2012-12-09 DIAGNOSIS — I4891 Unspecified atrial fibrillation: Secondary | ICD-10-CM | POA: Diagnosis present

## 2012-12-09 DIAGNOSIS — Z6841 Body Mass Index (BMI) 40.0 and over, adult: Secondary | ICD-10-CM

## 2012-12-09 DIAGNOSIS — R29898 Other symptoms and signs involving the musculoskeletal system: Secondary | ICD-10-CM

## 2012-12-09 DIAGNOSIS — N189 Chronic kidney disease, unspecified: Secondary | ICD-10-CM

## 2012-12-09 DIAGNOSIS — Z79899 Other long term (current) drug therapy: Secondary | ICD-10-CM

## 2012-12-09 DIAGNOSIS — I129 Hypertensive chronic kidney disease with stage 1 through stage 4 chronic kidney disease, or unspecified chronic kidney disease: Secondary | ICD-10-CM

## 2012-12-09 DIAGNOSIS — Z833 Family history of diabetes mellitus: Secondary | ICD-10-CM

## 2012-12-09 DIAGNOSIS — E876 Hypokalemia: Secondary | ICD-10-CM

## 2012-12-09 DIAGNOSIS — I428 Other cardiomyopathies: Secondary | ICD-10-CM

## 2012-12-09 LAB — BASIC METABOLIC PANEL
BUN: 13 mg/dL (ref 6–23)
CO2: 24 mEq/L (ref 19–32)
Calcium: 9.4 mg/dL (ref 8.4–10.5)
Chloride: 101 mEq/L (ref 96–112)
Creatinine, Ser: 1.54 mg/dL — ABNORMAL HIGH (ref 0.50–1.35)

## 2012-12-09 MED ORDER — TRAZODONE HCL 50 MG PO TABS: 25.0000 mg | ORAL_TABLET | Freq: Every evening | ORAL | Status: DC | PRN

## 2012-12-09 MED ORDER — CARVEDILOL 25 MG PO TABS
25.0000 mg | ORAL_TABLET | Freq: Two times a day (BID) | ORAL | Status: DC
  Administered 2012-12-09: 25 mg via ORAL
  Filled 2012-12-09 (×3): qty 1

## 2012-12-09 MED ORDER — ENOXAPARIN SODIUM 40 MG/0.4ML ~~LOC~~ SOLN: 40.0000 mg | SUBCUTANEOUS | Status: DC

## 2012-12-09 MED ORDER — CARVEDILOL 25 MG PO TABS
25.0000 mg | ORAL_TABLET | Freq: Two times a day (BID) | ORAL | Status: DC
  Administered 2012-12-09: 25 mg via ORAL
  Filled 2012-12-09 (×4): qty 1

## 2012-12-09 MED ORDER — DILTIAZEM HCL ER COATED BEADS 240 MG PO CP24
240.0000 mg | ORAL_CAPSULE | Freq: Every day | ORAL | Status: DC
  Administered 2012-12-10 – 2012-12-18 (×9): 240 mg via ORAL
  Filled 2012-12-09 (×11): qty 1

## 2012-12-09 MED ORDER — ACETAMINOPHEN 325 MG PO TABS
325.0000 mg | ORAL_TABLET | ORAL | Status: DC | PRN
  Administered 2012-12-10 (×2): 650 mg via ORAL
  Filled 2012-12-09 (×2): qty 2

## 2012-12-09 MED ORDER — RIVAROXABAN 20 MG PO TABS
20.0000 mg | ORAL_TABLET | Freq: Every day | ORAL | Status: DC
  Administered 2012-12-09 – 2012-12-18 (×10): 20 mg via ORAL
  Filled 2012-12-09 (×10): qty 1

## 2012-12-09 MED ORDER — IRBESARTAN 300 MG PO TABS
300.0000 mg | ORAL_TABLET | Freq: Every day | ORAL | Status: DC
  Administered 2012-12-10 – 2012-12-17 (×8): 300 mg via ORAL
  Filled 2012-12-09 (×11): qty 1

## 2012-12-09 MED ORDER — ALUM & MAG HYDROXIDE-SIMETH 200-200-20 MG/5ML PO SUSP: 30.0000 mL | ORAL | Status: DC | PRN

## 2012-12-09 MED ORDER — FLEET ENEMA 7-19 GM/118ML RE ENEM: 1.0000 | ENEMA | Freq: Once | RECTAL | Status: AC | PRN

## 2012-12-09 MED ORDER — PROCHLORPERAZINE MALEATE 5 MG PO TABS
5.0000 mg | ORAL_TABLET | Freq: Four times a day (QID) | ORAL | Status: DC | PRN
  Filled 2012-12-09: qty 2

## 2012-12-09 MED ORDER — SENNOSIDES-DOCUSATE SODIUM 8.6-50 MG PO TABS: 2.0000 | ORAL_TABLET | Freq: Every evening | ORAL | Status: DC | PRN

## 2012-12-09 MED ORDER — GUAIFENESIN-DM 100-10 MG/5ML PO SYRP: 5.0000 mL | ORAL_SOLUTION | Freq: Four times a day (QID) | ORAL | Status: DC | PRN

## 2012-12-09 MED ORDER — PROCHLORPERAZINE 25 MG RE SUPP
12.5000 mg | Freq: Four times a day (QID) | RECTAL | Status: DC | PRN
  Filled 2012-12-09: qty 1

## 2012-12-09 MED ORDER — PROCHLORPERAZINE EDISYLATE 5 MG/ML IJ SOLN
5.0000 mg | Freq: Four times a day (QID) | INTRAMUSCULAR | Status: DC | PRN
  Filled 2012-12-09: qty 2

## 2012-12-09 MED ORDER — SPIRONOLACTONE 25 MG PO TABS
25.0000 mg | ORAL_TABLET | Freq: Every day | ORAL | Status: DC
  Administered 2012-12-10: 25 mg via ORAL
  Filled 2012-12-09 (×2): qty 1

## 2012-12-09 MED ORDER — CLONIDINE HCL 0.1 MG PO TABS
0.1000 mg | ORAL_TABLET | ORAL | Status: DC | PRN
  Administered 2012-12-10 – 2012-12-13 (×3): 0.1 mg via ORAL
  Filled 2012-12-09 (×4): qty 1

## 2012-12-09 MED ORDER — BISACODYL 10 MG RE SUPP: 10.0000 mg | Freq: Every day | RECTAL | Status: DC | PRN

## 2012-12-09 MED ORDER — HYDROCHLOROTHIAZIDE 25 MG PO TABS
25.0000 mg | ORAL_TABLET | Freq: Every day | ORAL | Status: DC
  Administered 2012-12-10 – 2012-12-15 (×6): 25 mg via ORAL
  Filled 2012-12-09 (×8): qty 1

## 2012-12-09 MED ORDER — DIPHENHYDRAMINE HCL 12.5 MG/5ML PO ELIX: 12.5000 mg | ORAL_SOLUTION | Freq: Four times a day (QID) | ORAL | Status: DC | PRN

## 2012-12-09 MED ORDER — POTASSIUM CHLORIDE CRYS ER 10 MEQ PO TBCR
10.0000 meq | EXTENDED_RELEASE_TABLET | Freq: Every day | ORAL | Status: DC
  Administered 2012-12-10 – 2012-12-17 (×8): 10 meq via ORAL
  Filled 2012-12-09 (×11): qty 1

## 2012-12-09 NOTE — Progress Notes (Signed)
Patient ID: Thomas [name], male   DOB: Feb 24, 1981, 32 y.o.   MRN: [id number] Mr. Overfelt is resting completely. Blood pressure still not adequately controlled. Echocardiogram shows severe concentric left ventricular hypertrophy consistent with uncontrolled hypertension. His left atrium is moderately dilated, no obvious clot. He remains in atrial fibrillation with a well-controlled rate.  Review of his medications demonstrate that we can increase his carvedilol to 25 mg twice a day. Orders written. Will follow. Long term anticoagulation as noted by cardiology consultation with Dr.Bensimhon.Marland Kitchen

## 2012-12-09 NOTE — Plan of Care (Signed)
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 Co-morbidities: cardiomyopathy, morbid obesity, atrial for ablation  Functional Problem List  Patient demonstrates impairments in the following areas: Medication Management and Nutrition  Basic ADL's: grooming, bathing, dressing and toileting Advanced ADL's: simple meal preparation, laundry and light housekeeping  Transfers:  toilet and tub/shower Locomotion:  ambulation and stairs  Additional Impairments:  Functional use of upper extremity  Anticipated Outcomes Item Anticipated Outcome  Eating/Swallowing    Basic self-care  Independent  Tolieting  Independent  Bowel/Bladder  Continent bowel/bladder, mod I  Transfers  Independent  Locomotion  Independent, 16 stairs with one rail  Communication    Cognition    Pain  No c/o pain  Safety/Judgment    Other  Skin: no breakdown   Therapy Plan: PT Intensity: Minimum of 1-2 x/day ,45 to 90 minutes PT Frequency: 5 out of 7 days PT Duration Estimated Length of Stay: 7-10 days OT Intensity: Minimum of 1-2 x/day, 45 to 90 minutes OT Frequency: 5 out of 7 days OT Duration/Estimated Length of Stay: 7-10 days      Team Interventions: Item RN PT OT SLP SW TR Other  Self Care/Advanced ADL Retraining   x      Neuromuscular Re-Education  x x      Therapeutic Activities  x x      UE/LE Strength Training/ROM  x x      UE/LE Coordination Activities  x x      Visual/Perceptual Remediation/Compensation         DME/Adaptive Equipment Instruction  x x      Therapeutic Exercise  x x      Balance/Vestibular Training  x x      Patient/Family Education x x x      Cognitive Remediation/Compensation         Functional Mobility Training  x x      Ambulation/Gait Training  x       Statistician Reintegration   x      Dysphagia/Aspiration Film/video editor         Bladder Management x        Bowel Management x        Disease Management/Prevention x        Pain Management x x x      Medication Management x        Skin Care/Wound Management x        Splinting/Orthotics         Discharge Planning  x x      Psychosocial Support  x x                             Team Discharge Planning: Destination: PT-Home (home with mom staying with him or he will go to [facility]) ,OT- Home , SLP-  Projected Follow-up: PT-Outpatient PT, OT-  Outpatient OT, SLP-  Projected Equipment Needs: PT-None recommended by PT, OT- None recommended by OT, SLP-  Patient/family involved in discharge planning: PT- Patient,  OT-Patient, SLP-Patient  MD ELOS: 10-14 days Medical Rehab Prognosis:  Excellent Assessment: 31 year old male admitted for right MCA distribution infarct causing left hemiparesis and now requires 24 7 rehabilitation are in M.D. PT OT and speech therapy at CIR level. Team will be working on ADLs mobility endurance bowel and bladder function safety patient education.    See Team Conference Notes for weekly updates to the plan of care

## 2012-12-09 NOTE — Progress Notes (Signed)
Patient evaluated for long-term disease management services for Link to Wellness Program with [facility] as a benefit of being a [employer] employee with [employer]. Met at bedside to discuss Link to Wellness program with patient. Appears the plan is to go to CIR soon. Patient resting during visit. States "my phone keeps ringing and I cannot rest". Asked if he would like to discuss program and it's benefits at later time. Patient agreeable to speak at later time. Left Link to Wellness brochure and contact information at bedside. Will visit patient again once he goes to CIR.   Raiford Noble, [facility] Liaison [phone]

## 2012-12-09 NOTE — Progress Notes (Signed)
Stroke Team Progress Note  HISTORY Thomas [name] is an 32 y.o. male with a history of hypertension and atrial fibrillation who was at home and went to the bathroom with no complaints. While in the bathroom developed slurred speech and left sided weakness. EMS was called at that time and the patient was brought in 12/07/2012 as a code stroke. While in the CT symptoms improved somewhat but patient did not return to baseline. BP was markedly elevated. Patient reported being noncompliant with his antihypertensive and ASA for 9 months. He received IV TPA. He was admitted to the neuro ICU for further evaluation and treatment.  SUBJECTIVE Patient up in chair at bedside. No new complaints.  OBJECTIVE Most recent Vital Signs: Filed Vitals:   12/09/12 0203 12/09/12 0230 12/09/12 0558 12/09/12 0706  BP: 159/108 160/103 192/123 162/106  Pulse: 64  77   Temp: 99.1 F (37.3 C)  98.8 F (37.1 C)   TempSrc: Oral  Oral   Resp: 22  20   Height:      Weight:      SpO2: 99%  100%    IV Fluid Intake:      . sodium chloride 100 mL/hr at 12/08/12 1912  . niCARDipine Stopped (12/08/12 1400)   MEDICATIONS     . carvedilol  25 mg Oral BID WC  . diltiazem  240 mg Oral Daily  . hydrochlorothiazide  25 mg Oral Daily  . irbesartan  300 mg Oral Daily  . potassium chloride  20 mEq Oral Daily  . rivaroxaban  20 mg Oral Q supper  . spironolactone  25 mg Oral Daily   PRN:  acetaminophen, acetaminophen, cloNIDine, hydrALAZINE, ondansetron (ZOFRAN) IV, senna-docusate  Diet:  Cardiac thin liquids Activity:  Bedrest DVT Prophylaxis:  SCDs  CLINICALLY SIGNIFICANT STUDIES Basic Metabolic Panel:   Lab 12/09/12 0615 12/07/12 1256 12/07/12 1250  NA 136 142 --  K 4.1 3.1* --  CL 101 104 --  CO2 24 -- 26  GLUCOSE 95 131* --  BUN 13 21 --  CREATININE 1.54* 1.60* --  CALCIUM 9.4 -- 8.9  MG -- -- --  PHOS -- -- --   Liver Function Tests:   Lab 12/07/12 1250  AST 42*  ALT 47  ALKPHOS 49  BILITOT 0.7    PROT 7.4  ALBUMIN 3.7   CBC:   Lab 12/07/12 1256 12/07/12 1250  WBC -- 6.2  NEUTROABS -- 2.8  HGB 16.7 14.9  HCT 49.0 43.8  MCV -- 86.1  PLT -- 169   Coagulation:   Lab 12/07/12 1250  LABPROT 13.3  INR 1.02   Cardiac Enzymes:   Lab 12/07/12 1250  CKTOTAL --  CKMB --  CKMBINDEX --  TROPONINI <0.30   Urinalysis: No results found for this basename: COLORURINE:2,APPERANCEUR:2,LABSPEC:2,PHURINE:2,GLUCOSEU:2,HGBUR:2,BILIRUBINUR:2,KETONESUR:2,PROTEINUR:2,UROBILINOGEN:2,NITRITE:2,LEUKOCYTESUR:2 in the last 168 hours Lipid Panel    Component Value Date/Time   CHOL 142 12/08/2012 0424   TRIG 98 12/08/2012 0424   HDL 44 12/08/2012 0424   CHOLHDL 3.2 12/08/2012 0424   VLDL 20 12/08/2012 0424   LDLCALC 78 12/08/2012 0424   HgbA1C  Lab Results  Component Value Date   HGBA1C 5.8* 12/08/2012    Urine Drug Screen:     Component Value Date/Time   LABOPIA NONE DETECTED 12/04/2008 2340   COCAINSCRNUR NONE DETECTED 12/04/2008 2340   LABBENZ NONE DETECTED 12/04/2008 2340   AMPHETMU NONE DETECTED 12/04/2008 2340   THCU NONE DETECTED 12/04/2008 2340   LABBARB  Value: NONE DETECTED  DRUG SCREEN FOR MEDICAL PURPOSES ONLY.  IF CONFIRMATION IS NEEDED FOR ANY PURPOSE, NOTIFY LAB WITHIN 5 DAYS.        LOWEST DETECTABLE LIMITS FOR URINE DRUG SCREEN Drug Class       Cutoff (ng/mL) Amphetamine      1000 Barbiturate      200 Benzodiazepine   200 Tricyclics       300 Opiates          300 Cocaine          300 THC              50 12/04/2008 2340    Alcohol Level: No results found for this basename: ETH:2 in the last 168 hours  CT of the brain  12/07/2012   No visible hemorrhage post t-PA.  Evolving cytotoxic edema suggests early right MCA territory infarct.    12/07/2012 Exam is slightly motion degraded.  No obvious intracranial hemorrhage.  Mild indistinctness in the right periopercular region possibly representing findings of an acute right middle cerebral artery distribution infarct.  The right middle cerebral  artery does not appear hyperdense.  Since prior examination, small focal hypodensity left lenticular nucleus suggestive of remote infarct having occurred in the interim.   MRI of the brain  12/08/2012 Acute infarct right posterior insula and right parietal lobe.  No associated hemorrhage or midline shift.   MRA of the brain  12/08/2012   Negative.  Apparent recanalization of the right middle cerebral artery branch causing the acute infarct.   2D Echocardiogram  EF 50-55%, in atrial fibrillation  with no embolus seen   Carotid Doppler  Technically difficult due to respiratory interference - Snoring - and high bifurcations. Bilateral: No obvious evidence of hemodynamically significant internal carotid artery stenosis. Vertebral artery flow is antegrade.   CXR 12/07/2012 No acute cardiopulmonary disease.  Decreased sensitivity and specificity exam due to technique related factors.    EKG  atrial fibrillation  Therapy Recommendations CIR  Physical Exam   Young african [place] male not in distress.Awake alert. Afebrile. Head is nontraumatic. Neck is supple without bruit. Hearing is normal. Cardiac exam no murmur or gallop. Lungs are clear to auscultation. Distal pulses are well felt.   Neurological Exam ; Awake alert oriented x 3 normal speech and language.Fundi not visualized. Vision acuity and fields appear normal. No lower face asymmetry. Tongue midline. Mild LUE drift. Mild diminished fine finger movements on left. Orbits right over left upper extremity. Mild left grip weak..Diminished left hemibody sensation . Normal coordination. Gait deferred.  NIHSS 2 ( improved from 6 on admission) ASSESSMENT Thomas [name] is a 32 y.o. male presenting with slurred speech and left sided weakness. Status post IV t-PA 12/07/2012 at 1425. 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 MRI pending. Infarct felt to be  embolic secondary to atrial fibrillation.  Work up underway. On no antiplatelets prior to admission. Now  on xarelto for secondary stroke prevention. Patient with resultant left hemiparesis.  atrial fibrillation, followed by IM clinic at [name]. Last seen in 2011 when they recommend card consult Hypertension, back on cardene drip this am SVT Ventricular hypertrophy Hypokalemia, resolved, 4.1 Renal insufficiency, improving, Cr 1.54 Morbid obesity, Body mass index is 44.86 kg/(m^2).  Hospital day # 2  TREATMENT/PLAN  Continue  xarelto for secondary stroke prevention  monitor K  Rehab when bed available  Annie Main, MSN, RN, ANVP-BC, ANP-BC, [facility] Pager: [phone] 12/09/2012 9:46 AM  This patient is  critically ill and at significant risk of neurological worsening, death and care requires constant monitoring of vital signs, hemodynamics,respiratory and cardiac monitoring,review of multiple databases, neurological assessment, discussion with family, other specialists and medical decision making of high complexity. I spent 30 minutes of neurocritical care time  in the care of  this patient.   I have personally obtained a history, examined the patient, evaluated imaging results, and formulated the assessment and plan of care. I agree with the above.   Delia Heady, MD Medical Director [facility] Pager: [phone] 12/09/2012 9:46 AM

## 2012-12-09 NOTE — Consult Note (Signed)
Stroke Discharge Summary  Patient ID: Thomas [name]   MRN: [id number]      DOB: Apr 10, 1981  Date of Admission: 12/07/2012 Date of Discharge: 12/09/2012  Attending Physician:  Darcella Cheshire, MD, Stroke MD  Consulting Physician(s):  Treatment Team:  [facility] Stroke, MD [facility], MD rehabilitation medicine  Patient's PCP:  No primary provider on file.  Discharge Diagnoses:  Principal Problem:  *Cerebral embolism with cerebral infarction Active Problems:  Hemiplegia, unspecified, affecting nondominant side  HTN (hypertension), malignant  Morbid obesity with body mass index of 40.0-44.9 in adult  VENTRICULAR HYPERTROPHY, LEFT  RENAL INSUFFICIENCY  Atrial fibrillation with RVR  Hypokalemia  Overweight  HYPERTENSION, BENIGN ESSENTIAL, UNCONTROLLED BMI  Body mass index is 44.86 kg/(m^2).  Past Medical History  Diagnosis Date  . Cardiomyopathy 11/28/2010  . Palpitation 11/28/2010  . Atrial fibrillation 10/29/2010  . Benign essential hypertension 12/15/2008  . Paroxysmal SVT (supraventricular tachycardia) 12/15/2008  . 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 Renal insufficiency 11/28/2010  . Overweight 08/23/2010  . Hematuria, unspecified 12/15/2008   History reviewed. No pertinent past surgical history.  Medications to be continued on Rehab    . carvedilol  25 mg Oral BID WC  . diltiazem  240 mg Oral Daily  . hydrochlorothiazide  25 mg Oral Daily  . irbesartan  300 mg Oral Daily  . potassium chloride  20 mEq Oral Daily  . rivaroxaban  20 mg Oral Q supper  . spironolactone  25 mg Oral Daily    LABORATORY STUDIES CBC    Component Value Date/Time   WBC 6.2 12/07/2012 1250   RBC 5.09 12/07/2012 1250   HGB 16.7 12/07/2012 1256   HCT 49.0 12/07/2012 1256   PLT 169 12/07/2012 1250   MCV 86.1 12/07/2012 1250   MCH 29.3 12/07/2012 1250   MCHC 34.0 12/07/2012  1250   RDW 13.7 12/07/2012 1250   LYMPHSABS 2.8 12/07/2012 1250   MONOABS 0.4 12/07/2012 1250   EOSABS 0.1 12/07/2012 1250   BASOSABS 0.0 12/07/2012 1250   CMP    Component Value Date/Time   NA 136 12/09/2012 0615   K 4.1 12/09/2012 0615   CL 101 12/09/2012 0615   CO2 24 12/09/2012 0615   GLUCOSE 95 12/09/2012 0615   BUN 13 12/09/2012 0615   CREATININE 1.54* 12/09/2012 0615   CALCIUM 9.4 12/09/2012 0615   PROT 7.4 12/07/2012 1250   ALBUMIN 3.7 12/07/2012 1250   AST 42* 12/07/2012 1250   ALT 47 12/07/2012 1250   ALKPHOS 49 12/07/2012 1250   BILITOT 0.7 12/07/2012 1250   GFRNONAA 59* 12/09/2012 0615   GFRAA 68* 12/09/2012 0615   COAGS Lab Results  Component Value Date   INR 1.02 12/07/2012   INR 0.9 12/04/2008   Lipid Panel    Component Value Date/Time   CHOL 142 12/08/2012 0424   TRIG 98 12/08/2012 0424   HDL 44 12/08/2012 0424   CHOLHDL 3.2 12/08/2012 0424   VLDL 20 12/08/2012 0424   LDLCALC 78 12/08/2012 0424   HgbA1C  Lab Results  Component Value Date   HGBA1C 5.8* 12/08/2012   Cardiac Panel (last 3 results)  Basename 12/07/12 1250  CKTOTAL --  CKMB --  TROPONINI <0.30  RELINDX --   Urinalysis    Component Value Date/Time   COLORURINE YELLOW 12/15/2008 2055   APPEARANCEUR CLEAR 12/15/2008 2055  LABSPEC 1.023 12/15/2008 2055   PHURINE 6.0 12/15/2008 2055   GLUCOSEU NEGATIVE 12/04/2008 0856   HGBUR LARGE* 12/04/2008 0856   BILIRUBINUR NEG 12/15/2008 2055   KETONESUR NEG mg/dL 1/61/0960 4540   PROTEINUR NEG mg/dL 9/81/1914 7829   UROBILINOGEN 0.2 12/15/2008 2055   NITRITE NEG 12/15/2008 2055   LEUKOCYTESUR SMALL* 12/15/2008 2055   Urine Drug Screen    Component Value Date/Time   LABOPIA NONE DETECTED 12/04/2008 2340   COCAINSCRNUR NONE DETECTED 12/04/2008 2340   LABBENZ NONE DETECTED 12/04/2008 2340   AMPHETMU NONE DETECTED 12/04/2008 2340   THCU NONE DETECTED 12/04/2008 2340   LABBARB  Value: NONE DETECTED        DRUG SCREEN FOR MEDICAL PURPOSES ONLY.  IF CONFIRMATION IS NEEDED FOR ANY PURPOSE, NOTIFY LAB WITHIN 5  DAYS.        LOWEST DETECTABLE LIMITS FOR URINE DRUG SCREEN Drug Class       Cutoff (ng/mL) Amphetamine      1000 Barbiturate      200 Benzodiazepine   200 Tricyclics       300 Opiates          300 Cocaine          300 THC              50 12/04/2008 2340    Alcohol Level    Component Value Date/Time   ETH  Value: <5        LOWEST DETECTABLE LIMIT FOR SERUM ALCOHOL IS 5 mg/dL FOR MEDICAL PURPOSES ONLY 12/04/2008 1400    SIGNIFICANT DIAGNOSTIC STUDIES  CT of the brain  12/07/2012 No visible hemorrhage post t-PA. Evolving cytotoxic edema suggests early right MCA territory infarct.  12/07/2012 Exam is slightly motion degraded. No obvious intracranial hemorrhage. Mild indistinctness in the right periopercular region possibly representing findings of an acute right middle cerebral artery distribution infarct. The right middle cerebral artery does not appear hyperdense. Since prior examination, small focal hypodensity left lenticular nucleus suggestive of remote infarct having occurred in the interim.   MRI of the brain 12/08/2012 Acute infarct right posterior insula and right parietal lobe. No associated hemorrhage or midline shift.   MRA of the brain 12/08/2012 Negative. Apparent recanalization of the right middle cerebral artery branch causing the acute infarct.   2D Echocardiogram EF 50-55%, in atrial fibrillation with no embolus seen   Carotid Doppler Technically difficult due to respiratory interference - Snoring - and high bifurcations. Bilateral: No obvious evidence of hemodynamically significant internal carotid artery stenosis. Vertebral artery flow is antegrade.   CXR 12/07/2012 No acute cardiopulmonary disease. Decreased sensitivity and specificity exam due to technique related factors.   EKG atrial fibrillation   History of Present Illness  Thomas [name] is an 32 y.o. male with a history of hypertension and atrial fibrillation who was at home and went to the bathroom with no complaints. While in  the bathroom developed slurred speech and left sided weakness. EMS was called at that time and the patient was brought in 12/07/2012 as a code stroke. While in the CT symptoms improved somewhat but patient did not return to baseline. BP was markedly elevated. Patient reported being noncompliant with his antihypertensive and ASA for 9 months. He received IV TPA. He was admitted to the neuro ICU for further evaluation and treatment  Hospital Course  [name] is a 32 y.o. male presenting with slurred speech and left sided weakness. Status post IV t-PA 12/07/2012 at 1425. Imaging  confirms a right MCA infarct, felt to be embolic secondary to atrial fibrillation. On no antiplatelets prior to admission. Now on xarelto for secondary stroke prevention. Patient with resultant left hemiparesis.  atrial fibrillation, followed by IM clinic at [facility]. Last seen in 2011 when they recommend card consult  Hypertension, medical management SVT on cardizem-coreg Ventricular hypertrophy, most likely due to long standing hypertension Hypokalemia, resolved, 4.1  Renal insufficiency, improving, Cr 1.54  Morbid obesity, Body mass index is 44.86 kg/(m^2). Hospital day # 2  TREATMENT/PLAN  Continue xarelto for secondary stroke prevention Risk factor management Establish primary MD once discharged for follow up of blood pressure and to establish long term care.   Physical therapy, occupational therapy and speech therapy evaluated patient. All agreed inpatient rehab is needed. Patient's family is/are supportive and can provide care at discharge. CIR bed is available today and patient will be transferred there.  Discharge Exam  Blood pressure 167/108, pulse 52, temperature 99.4 F (37.4 C), temperature source Oral, resp. rate 18, height 6\' 1"  (1.854 m), weight 154.223 kg (340 lb), SpO2 100.00%.   Physical Exam  Young african [place] male not in distress.Awake alert. Afebrile. Head is nontraumatic. Neck is supple  without bruit. Hearing is normal. Cardiac exam no murmur or gallop. Lungs are clear to auscultation. Distal pulses are well felt.  Neurological Exam ; Awake alert oriented x 3 normal speech and language.Fundi not visualized. Vision acuity and fields appear normal. No lower face asymmetry. Tongue midline. Mild LUE drift. Mild diminished fine finger movements on left. Orbits right over left upper extremity. Mild left grip weak..Diminished left hemibody sensation . Normal coordination. Gait deferred.  NIHSS 2 ( improved from 6 on admission)  Discharge Diet  Cardiac thin liquids  Discharge Plan  Disposition:  Transfer to [facility] for ongoing PT, OT and ST  Xarelto for secondary stroke prevention.  Ongoing risk factor control by Primary Care Physician. Risk factor recommendations:  Hypertension target range 130-140/70-80 Lipid range - LDL < 100 and checked every 6 months, fasting Diabetes - HgB A1C <7   Follow-up with primary care MD in 1 month. Patient needs to establish care.  Follow-up with Dr. Delia Heady in 2 months.  45 minutes were spent preparing discharge.  Signed Gwendolyn Lima. Manson Passey, [facility], MBA, MHA [facility] Pager: [phone] 12/09/2012 2:03 PM  I have personally examined this patient, reviewed pertinent data and developed the plan of care. I agree with above.  Delia Heady, MD Medical Director [facility] Pager: [phone] 12/09/2012 5:51 PM

## 2012-12-09 NOTE — Progress Notes (Signed)
Physical Therapy Treatment Patient Details Name: Thomas [name] MRN: [id number] DOB: 1981-08-09 Today's Date: 12/09/2012 Time: 1478-2956 PT Time Calculation (min): 38 min  PT Assessment / Plan / Recommendation Comments on Treatment Session  Pt progressing with PT goals & mobility at this date.  Cont's to require +2 (A) for OOB mobility but was able to ambulate ~15' at this date.  Continue to strongly recommend CIR.      Follow Up Recommendations  CIR     Does the patient have the potential to tolerate intense rehabilitation     Barriers to Discharge        Equipment Recommendations   (will cont to assess)    Recommendations for Other Services Rehab consult  Frequency Min 4X/week   Plan Discharge plan remains appropriate    Precautions / Restrictions Precautions Precautions: Fall Restrictions Weight Bearing Restrictions: No       Mobility  Bed Mobility Bed Mobility: Supine to Sit;Sitting - Scoot to Edge of Bed Supine to Sit: 5: Supervision Sitting - Scoot to Edge of Bed: 5: Supervision Details for Bed Mobility Assistance: Encouragement to increase use of LUE.   Transfers Transfers: Sit to Stand;Stand to Sit Sit to Stand: 1: +2 Total assist;With upper extremity assist;From bed Sit to Stand: Patient Percentage: 70% Stand to Sit: 1: +2 Total assist;With armrests;With upper extremity assist;To chair/3-in-1 Stand to Sit: Patient Percentage: 60% Details for Transfer Assistance: Cues for hand placement & technique.  Pt required physical (A) to position LUE on RW with stand>sit + armrest of recliner with sit>stand.   Ambulation/Gait Ambulation/Gait Assistance: 1: +2 Total assist Ambulation/Gait: Patient Percentage: 70% Ambulation Distance (Feet): 15 Feet Assistive device: Rolling walker Ambulation/Gait Assistance Details: Cues for RW advancement, LE sequencing, & technique.  (A) for balance, keep LUE positioned on RW due to pt with difficulty keeping grasp on grip.  Pt with  decreased stride & decreased floor clearance L>R.   Gait Pattern: Step-through pattern;Decreased stride length;Decreased step length - right;Decreased step length - left;Decreased weight shift to right;Decreased hip/knee flexion - left Stairs: No Wheelchair Mobility Wheelchair Mobility: No Modified Rankin (Stroke Patients Only) Pre-Morbid Rankin Score: No symptoms Modified Rankin: Moderately severe disability    Exercises:    LAQ's; Both; 10 reps Seated Marching; both; 10 reps       PT Goals Acute Rehab PT Goals Time For Goal Achievement: 12/22/12 Potential to Achieve Goals: Good Pt will go Supine/Side to Sit: with supervision PT Goal: Supine/Side to Sit - Progress: Met Pt will Sit at Edge of Bed: with supervision;1-2 min PT Goal: Sit at Edge Of Bed - Progress: Progressing toward goal Pt will go Sit to Supine/Side: with supervision Pt will go Sit to Stand: with mod assist PT Goal: Sit to Stand - Progress: Progressing toward goal Pt will go Stand to Sit: with mod assist PT Goal: Stand to Sit - Progress: Progressing toward goal Pt will Transfer Bed to Chair/Chair to Bed: with +2 total assist Pt will Stand: with mod assist;1 - 2 min PT Goal: Stand - Progress: Progressing toward goal Pt will Ambulate: 1 - 15 feet;with +2 total assist;with least restrictive assistive device PT Goal: Ambulate - Progress: Progressing toward goal  Visit Information  Last PT Received On: 12/09/12 Assistance Needed: +1    Subjective Data      Cognition  Overall Cognitive Status: Impaired Area of Impairment: Attention;Safety/judgement;Problem solving Arousal/Alertness: Awake/alert Orientation Level: Appears intact for tasks assessed Behavior During Session: [facility] for tasks performed Current Attention Level:  Sustained Safety/Judgement: Decreased awareness of safety precautions Problem Solving: Pt slow to process and needs extra time to process    Balance  Balance Balance Assessed: Yes Static  Sitting Balance Static Sitting - Balance Support: Feet supported Static Sitting - Level of Assistance: 5: Stand by assistance Static Standing Balance Static Standing - Balance Support: Left upper extremity supported Static Standing - Level of Assistance: 3: Mod assist Static Standing - Comment/# of Minutes: 2 minutes.  (A) to maintain standing midline.  Still with Lt lateral lean.    End of Session PT - End of Session Equipment Utilized During Treatment: Gait belt Activity Tolerance: Patient tolerated treatment well Patient left: in chair;with call bell/phone within reach Nurse Communication: Mobility status     Verdell Face, [place] [phone] 12/09/2012

## 2012-12-09 NOTE — Progress Notes (Deleted)
OT Cancellation Note  Patient Details Name: Thomas [name] MRN: [id number] DOB: 1981/05/14   Cancelled Treatment:    Reason Eval/Treat Not Completed: Pt adamantly declined OOB.  Will continue to follow.  Evern Bio 12/09/2012, 9:28 AM

## 2012-12-09 NOTE — Progress Notes (Signed)
Rehab admissions - I have approval from [facility] insurance.  Bed available and can admit to acute inpatient rehab today.  Authorization for acute stay is [phone] with [employer].  Authorization for inpatient rehab stay is [phone] with update due on 12/16/12.  Call me for questions.  #[phone]

## 2012-12-09 NOTE — Evaluation (Signed)
Occupational Therapy Evaluation Patient Details Name: Thomas [name] MRN: [id number] DOB: 06-09-1981 Today's Date: 12/09/2012 Time: 5621-3086 OT Time Calculation (min): 39 min  OT Assessment / Plan / Recommendation Clinical Impression  Pt admitted with L side weakness and slurred speech as a result of R MCA infarct.  Pt requires +2 assist for OOB mobility, but reports improvement in use of L side compared to yesterday.  Began education in hemi techniques for bathing and dressing and encouraged use of L UE and AROM outside of therapy visits.    OT Assessment  Patient needs continued OT Services    Follow Up Recommendations  CIR    Barriers to Discharge      Equipment Recommendations  None recommended by OT    Recommendations for Other Services Rehab consult  Frequency  Min 3X/week    Precautions / Restrictions Precautions Precautions: Fall Restrictions Weight Bearing Restrictions: No   Pertinent Vitals/Pain No pain   ADL  Eating/Feeding: Performed;Set up (with Right hand) Where Assessed - Eating/Feeding: Chair Grooming: Performed;Wash/dry hands;Wash/dry face;Set up Where Assessed - Grooming: Unsupported sitting Upper Body Bathing: Moderate assistance;Simulated Where Assessed - Upper Body Bathing: Unsupported sitting Lower Body Bathing: +1 Total assistance;Simulated Where Assessed - Lower Body Bathing: Supported sit to stand Upper Body Dressing: Performed;Maximal assistance Where Assessed - Upper Body Dressing: Unsupported sitting Lower Body Dressing: Simulated;+1 Total assistance Where Assessed - Lower Body Dressing: Unsupported sitting;Supported sit to stand Equipment Used: Gait belt;Rolling walker Transfers/Ambulation Related to ADLs: required assist to maintain R hand on walker, +2 total assist pt 70% to ambulate to door ADL Comments: Instructed pt in hemitechniques for dressing.    OT Diagnosis: Generalized weakness;Cognitive deficits;Hemiplegia dominant side  OT  Problem List: Decreased strength;Decreased activity tolerance;Impaired balance (sitting and/or standing);Decreased coordination;Decreased cognition;Decreased safety awareness;Decreased knowledge of use of DME or AE;Obesity;Impaired UE functional use OT Treatment Interventions: Self-care/ADL training;Neuromuscular education;DME and/or AE instruction;Therapeutic activities;Patient/family education;Balance training   OT Goals Acute Rehab OT Goals OT Goal Formulation: With patient Time For Goal Achievement: 12/23/12 Potential to Achieve Goals: Good ADL Goals Pt Will Perform Eating: with modified independence;Sitting, chair ADL Goal: Eating - Progress: Goal set today Pt Will Perform Grooming: Standing at sink;with set-up ADL Goal: Grooming - Progress: Goal set today Pt Will Perform Upper Body Bathing: with min assist;Sitting, edge of bed ADL Goal: Upper Body Bathing - Progress: Goal set today Pt Will Perform Upper Body Dressing: with min assist;Sitting, bed ADL Goal: Upper Body Dressing - Progress: Goal set today Pt Will Transfer to Toilet: with min assist;Ambulation;Comfort height toilet;Grab bars ADL Goal: Toilet Transfer - Progress: Goal set today Pt Will Perform Toileting - Clothing Manipulation: with min assist;Standing ADL Goal: Toileting - Clothing Manipulation - Progress: Goal set today Pt Will Perform Toileting - Hygiene: with set-up;Leaning right and/or left on 3-in-1/toilet ADL Goal: Toileting - Hygiene - Progress: Goal set today Miscellaneous OT Goals Miscellaneous OT Goal #1: Pt will be independent in exercise activities for L hand to perform between therapy sessions. OT Goal: Miscellaneous Goal #1 - Progress: Goal set today  Visit Information  Last OT Received On: 12/09/12 Assistance Needed: +2 PT/OT Co-Evaluation/Treatment: Yes Reason Eval/Treat Not Completed: Other (comment) (Pt adamantly declined OOB.  Will continue to follow.)    Subjective Data  Subjective: "I work at  [employer]." Patient Stated Goal: Return to PLOF.   Prior Functioning     Home Living Lives With: Alone Type of Home: Apartment Home Access: Stairs to enter Entrance Stairs-Number of Steps: 16  Entrance Stairs-Rails: Right;Left;Can reach both Home Layout: One level Bathroom Shower/Tub: Engineer, manufacturing systems: Standard Bathroom Accessibility: Yes How Accessible: Accessible via walker Home Adaptive Equipment: None Additional Comments: Need to further assess caregiver support.  Girlfriend from [place] and family currently on there way from [place], [place]. Prior Function Level of Independence: Independent Able to Take Stairs?: Yes Driving: Yes Vocation: Full time employment Communication Communication: No difficulties Dominant Hand: Left         Vision/Perception Vision - Assessment Eye Alignment: Within Functional Limits   Cognition  Overall Cognitive Status: Impaired Area of Impairment: Attention;Safety/judgement;Problem solving Arousal/Alertness: Awake/alert Orientation Level: Appears intact for tasks assessed Behavior During Session: [facility] for tasks performed Current Attention Level: Sustained Safety/Judgement: Decreased awareness of safety precautions Problem Solving: Pt slow to process and needs extra time to process    Extremity/Trunk Assessment Right Upper Extremity Assessment RUE ROM/Strength/Tone: Within functional levels RUE Coordination: WFL - gross/fine motor Left Upper Extremity Assessment LUE ROM/Strength/Tone: Deficits LUE ROM/Strength/Tone Deficits: 3/5 all areas LUE Sensation: Deficits LUE Sensation Deficits: impaired light touch/finger id, proprioception LUE Coordination: Deficits LUE Coordination Deficits: poor coordination with varying tone, no functional use, unable to grasp and maintain on walker without max assist.     Mobility Bed Mobility Bed Mobility: Supine to Sit;Sitting - Scoot to Edge of Bed Supine to Sit: 5:  Supervision Sitting - Scoot to Edge of Bed: 5: Supervision Transfers Transfers: Sit to Stand;Stand to Sit Sit to Stand: From bed;With upper extremity assist;From elevated surface;1: +2 Total assist Sit to Stand: Patient Percentage: 70% Stand to Sit: 1: +2 Total assist;With upper extremity assist;To chair/3-in-1 Stand to Sit: Patient Percentage: 60% Details for Transfer Assistance: verbal cues for technique and hand placement     Shoulder Instructions     Exercise     Balance Balance Balance Assessed: Yes Static Sitting Balance Static Sitting - Balance Support: Feet supported Static Sitting - Level of Assistance: 5: Stand by assistance Static Standing Balance Static Standing - Balance Support: Left upper extremity supported Static Standing - Level of Assistance: 3: Mod assist Static Standing - Comment/# of Minutes: 2   End of Session OT - End of Session Activity Tolerance: Patient limited by fatigue Patient left: in chair;with call bell/phone within reach  GO     Evern Bio 12/09/2012, 11:19 AM [phone]

## 2012-12-09 NOTE — Progress Notes (Signed)
Report given to rehab nurse Gracie. Pt being moved to rehab. Under no s/s distress.

## 2012-12-09 NOTE — H&P (Signed)
Physical Medicine and Rehabilitation Admission H&P  Chief Complaint   Patient presents with   .  left sided weakness and slurred speech.   :  HPI: Thomas [name] is a 32 y.o. LH-male with history of CM, atrial fibrillation, morbid obesity, who was admitted on 12/07/12 with left sided weakness and slurred speech. BP markedly elevated 212/132 and patient with history of non compliance with antihypertensives and ASA for past 9 months (did feel he needed them). CT head with indistinctness in right periopercular region and once BP stabilized, he was treated with tPA. Follow up CCT with evolving cytotoxic edema suggesting early R-MCA infarct and no visible hemorrhage past tPA. Dr. Gala Romney consulted for input on atrial fibrillation and BP. He recommended resuming home meds and addition of Cardizem for rate control. Will need anticoagulation as well as outpatient sleep study. 2D echo done revealing EF 50-55% with dilated left atrium and severe concentric hypertrophy. Carotid dopplers without ICA stenosis. Neurology recommended Xarelto for CVA prophylaxis and to help with compliance. Patient continues with left sided weakness with decreased balance. Therapy team recommending CIR and patient admitted today for intensive therapies.  Review of Systems  HENT: Negative for hearing loss.  Eyes: Negative for blurred vision, double vision and photophobia.  Respiratory: Positive for shortness of breath (due to allergies). Negative for cough and wheezing.  Cardiovascular: Negative for chest pain and palpitations.  Gastrointestinal: Negative for heartburn, nausea and abdominal pain.  Genitourinary: Negative for urgency and frequency.  Musculoskeletal: Negative for myalgias and back pain.  Neurological: Positive for focal weakness. Negative for headaches.  Psychiatric/Behavioral: Negative for depression. The patient is not nervous/anxious and does not have insomnia.   Past Medical History   Diagnosis  Date   .   Cardiomyopathy  11/28/2010   .  Palpitation  11/28/2010   .  Atrial fibrillation  10/29/2010   .  Benign essential hypertension  12/15/2008   .  Paroxysmal SVT (supraventricular tachycardia)  12/15/2008   .  Ventricular hypertrophy  12/15/2008     left sided, ECHO from 2010 - left ventricular size was normal, overall left ventricular systolic function was normal   .  Renal insufficiency  11/28/2010   .  Overweight  08/23/2010   .  Hematuria, unspecified  12/15/2008    History reviewed. No pertinent past surgical history.  Family History   Problem  Relation  Age of Onset   .  Hypertension  Mother    .  Hypertension  Father    .  Diabetes  Mother    .  Glaucoma  Father     Social History: Lives alone. 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 Girlfriend and family live OOT. He reports that he has never smoked. He does not have any smokeless tobacco history on file. He reports that he drinks alcohol--mixed drink once a month. He reports that he does not use illicit drugs.  Allergies: No Known Allergies  Scheduled Meds:  .  carvedilol  25 mg  Oral  BID WC   .  diltiazem  240 mg  Oral  Daily   .  hydrochlorothiazide  25 mg  Oral  Daily   .  irbesartan  300 mg  Oral  Daily   .  potassium chloride  20 mEq  Oral  Daily   .  rivaroxaban  20 mg  Oral  Q supper   .  spironolactone  25 mg  Oral  Daily   .  Medications Prior to Admission   Medication  Sig  Dispense  Refill   .  [DISCONTINUED] guaiFENesin (MUCINEX) 600 MG 12 hr tablet  Take 600 mg by mouth 2 (two) times daily as needed. For chest congestion     .  [DISCONTINUED] Multiple Vitamin (MULTIVITAMIN WITH MINERALS) TABS  Take 1 tablet by mouth daily.      Home:  Home Living  Lives With: Alone  Type of Home: Apartment  Home Access: Stairs to enter  Entrance Stairs-Number of Steps: 16  Entrance Stairs-Rails: Right;Left;Can reach both  Home Layout: One level  Bathroom Shower/Tub: Systems analyst: Standard  Bathroom Accessibility: Yes  How Accessible: Accessible via walker  Home Adaptive Equipment: None  Additional Comments: Need to further assess caregiver support. Girlfriend from [place] and family currently on there way from [place], [place].  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 Status:  Mobility:  Bed Mobility  Bed Mobility: Supine to Sit;Sitting - Scoot to Edge of Bed  Supine to Sit: 5: Supervision  Sitting - Scoot to Edge of Bed: 5: Supervision  Sit to Supine: 3: Mod assist  Transfers  Transfers: Sit to Stand;Stand to Sit  Sit to Stand: 1: +2 Total assist;With upper extremity assist;From bed  Sit to Stand: Patient Percentage: 70%  Stand to Sit: 1: +2 Total assist;With armrests;With upper extremity assist;To chair/3-in-1  Stand to Sit: Patient Percentage: 60%  Ambulation/Gait  Ambulation/Gait Assistance: 1: +2 Total assist  Ambulation/Gait: Patient Percentage: 70%  Ambulation Distance (Feet): 15 Feet  Assistive device: Rolling walker  Ambulation/Gait Assistance Details: Cues for RW advancement, LE sequencing, & technique. (A) for balance, keep LUE positioned on RW due to pt with difficulty keeping grasp on grip. Pt with decreased stride & decreased floor clearance L>R.  Gait Pattern: Step-through pattern;Decreased stride length;Decreased step length - right;Decreased step length - left;Decreased weight shift to right;Decreased hip/knee flexion - left  Stairs: No  Wheelchair Mobility  Wheelchair Mobility: No  ADL:  ADL  Eating/Feeding: Performed;Set up (with Right hand)  Where Assessed - Eating/Feeding: Chair  Grooming: Performed;Wash/dry hands;Wash/dry face;Set up  Where Assessed - Grooming: Unsupported sitting  Upper Body Bathing: Moderate assistance;Simulated  Where Assessed -  Upper Body Bathing: Unsupported sitting  Lower Body Bathing: +1 Total assistance;Simulated  Where Assessed - Lower Body Bathing: Supported sit to stand  Upper Body Dressing: Performed;Maximal assistance  Where Assessed - Upper Body Dressing: Unsupported sitting  Lower Body Dressing: Simulated;+1 Total assistance  Where Assessed - Lower Body Dressing: Unsupported sitting;Supported sit to stand  Equipment Used: Gait belt;Rolling walker  Transfers/Ambulation Related to ADLs: required assist to maintain R hand on walker, +2 total assist pt 70% to ambulate to door  ADL Comments: Instructed pt in hemitechniques for dressing.  Cognition:  Cognition  Overall Cognitive Status: Appears within functional limits for tasks assessed  Arousal/Alertness: Awake/alert  Orientation Level: Oriented X4  Cognition  Overall Cognitive Status: Impaired  Area of Impairment: Attention;Safety/judgement;Problem solving  Arousal/Alertness: Awake/alert  Orientation Level: Appears intact for tasks assessed  Behavior During Session: [facility] for tasks performed  Current Attention Level: Sustained  Attention - Other Comments: Lethargic decrease attention  Safety/Judgement: Decreased awareness of safety precautions  Problem Solving: Pt slow to process and needs extra time to process  Blood pressure 156/93, pulse  62, temperature 98.5 F (36.9 C), temperature source Oral, resp. rate 18, height 6\' 1"  (1.854 m), weight 154.223 kg (340 lb), SpO2 100.00%.  Physical Exam  Nursing note and vitals reviewed.  Constitutional: He is oriented to person, place, and time. He appears well-developed and well-nourished. obese HENT:  Head: Normocephalic and atraumatic.  Eyes: Pupils are equal, round, and reactive to light.  Neck: Normal range of motion.  Cardiovascular: Normal rate and regular rhythm.  Pulmonary/Chest: Effort normal and breath sounds normal.  Abdominal: Soft. Bowel sounds are normal.  Musculoskeletal: Normal range of  motion. He exhibits edema (left hand).  Neurological: He is alert and oriented to person, place, and time.  Speech clear. Follows 2 step commands without difficulty. Mild left inattention. Mild left facial droop, ptosis. Left UE 4- deltoid, biceps, triceps. Wrist 3. HI 3. LLE  4/5 grossly. Sensory 1/2 left face, arm. 1+ left leg. DTR's 1+.  Cognitively intact. Skin: Skin is warm and dry.  Psychiatric: He has a normal mood and affect. His speech is normal and behavior is normal. Thought content normal. Cognition and memory are normal.   Results for orders placed during the hospital encounter of 12/07/12 (from the past 48 hour(s))   HEMOGLOBIN A1C Status: Abnormal    Collection Time    12/08/12 4:24 AM   Component  Value  Range  Comment    Hemoglobin A1C  5.8 (*)  <5.7 %     Mean Plasma Glucose  120 (*)  <117 mg/dL    LIPID PANEL Status: Normal    Collection Time    12/08/12 4:24 AM   Component  Value  Range  Comment    Cholesterol  142  0 - 200 mg/dL     Triglycerides  98  <150 mg/dL     HDL  44  >16 mg/dL     Total CHOL/HDL Ratio  3.2      VLDL  20  0 - 40 mg/dL     LDL Cholesterol  78  0 - 99 mg/dL    BASIC METABOLIC PANEL Status: Abnormal    Collection Time    12/09/12 6:15 AM   Component  Value  Range  Comment    Sodium  136  135 - 145 mEq/L     Potassium  4.1  3.5 - 5.1 mEq/L     Chloride  101  96 - 112 mEq/L     CO2  24  19 - 32 mEq/L     Glucose, Bld  95  70 - 99 mg/dL     BUN  13  6 - 23 mg/dL     Creatinine, Ser  1.09 (*)  0.50 - 1.35 mg/dL     Calcium  9.4  8.4 - 10.5 mg/dL     GFR calc non Af Amer  59 (*)  >90 mL/min     GFR calc Af Amer  68 (*)  >90 mL/min     Dg Chest Port 1 View  12/07/2012 *RADIOLOGY REPORT* Clinical Data: Stroke PORTABLE CHEST - 1 VIEW Comparison: 12/04/2008 Findings: Mildly degraded exam due to AP portable technique and patient body habitus. Numerous leads and wires project over the chest. Midline trachea. Borderline cardiomegaly, accentuated by low  lung volumes. No right and no definite left pleural fluid. The left costophrenic angle is partially excluded. No pneumothorax. No congestive failure. Clear lungs. IMPRESSION: No acute cardiopulmonary disease. Decreased sensitivity and specificity exam due to technique related factors, as described  above. Original Report Authenticated By: Jeronimo Greaves, M.D.  Mr Mra Head/brain Wo Cm  12/08/2012 *RADIOLOGY REPORT* Clinical Data: Stroke. Post t-PA. MRI HEAD WITHOUT CONTRAST MRA HEAD WITHOUT CONTRAST Technique: Multiplanar, multiecho pulse sequences of the brain and surrounding structures were obtained without intravenous contrast. Angiographic images of the head were obtained using MRA technique without contrast. Comparison: CT 12/07/2012 MRI HEAD Findings: Acute infarct in the right posterior insula and right parietal lobe. This is in the right MCA territory and is approximately one third of the right MCA territory. No associated hemorrhage. No midline shift. No other acute infarct. Mild chronic microvascular ischemia in the deep white matter on the left. The patient was not able to complete the study however the study is diagnostic for acute infarction. IMPRESSION: Acute infarct right posterior insula and right parietal lobe. No associated hemorrhage or midline shift. MRA HEAD Findings: Both vertebral arteries are patent to the basilar. The basilar is widely patent. Fetal origin of the posterior cerebral artery bilaterally. Superior cerebellar and posterior cerebral arteries are patent bilaterally without stenosis. Internal carotid artery is patent bilaterally without stenosis. Anterior and middle cerebral arteries are patent. In particular, the posterior division of the right middle cerebral artery is patent without filling defect or occlusion. Negative for cerebral aneurysm. IMPRESSION: Negative. Apparent recanalization of the right middle cerebral artery branch causing the acute infarct. Original Report  Authenticated By: Janeece Riggers, M.D.  Post Admission Physician Evaluation:  1. 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 2. Patient is admitted to receive collaborative, interdisciplinary care between the physiatrist, rehab nursing staff, and therapy team. 3. Patient's level of medical complexity and substantial therapy needs in context of that medical necessity cannot be provided at a lesser intensity of care such as a SNF. 4. Patient has experienced substantial functional loss from his/her baseline which was documented above under the "Functional History" and "Functional Status" headings. Judging by the patient's diagnosis, physical exam, and functional history, the patient has potential for functional progress which will result in measurable gains while on inpatient rehab. These gains will be of substantial and practical use upon discharge in facilitating mobility and self-care at the household level. 5. Physiatrist will provide 24 hour management of medical needs as well as oversight of the therapy plan/treatment and provide guidance as appropriate regarding the interaction of the two. 6. 24 hour rehab nursing will assist with bladder management, bowel management, safety, skin/wound care, disease management, medication administration, pain management and patient education and help integrate therapy concepts, techniques,education, etc. 7. PT will assess and treat for: Lower extremity strength, range of motion, stamina, balance, functional mobility, safety, adaptive techniques and equipment, NMR, education. Goals are: mod I. 8. OT will assess and treat for: ADL's, functional mobility, safety, upper extremity strength, adaptive techniques and equipment, NMR, education. Goals are: mod I. 9. SLP will assess and treat for: n/a. Goals are: n/a. 10. Case Management and Social Worker will assess and treat for psychological issues and discharge planning. 11. Team conference will be held weekly  to assess progress toward goals and to determine barriers to discharge. 12. Patient will receive at least 3 hours of therapy per day at least 5 days per week. 13. ELOS: 2 weeks Prognosis: excellent Medical Problem List and Plan:  1. DVT Prophylaxis/Anticoagulation: Pharmaceutical:Xarelto, also for secondary stroke prevention  2. Pain Management: N/A  3. Mood: Seems to have a good outlook. 4. Neuropsych: This patient is capable of making decisions on his/her own behalf.  5  HTN: Monitor with bid checks. Continue coreg, Cardizem, Avapro and HCTZ. Continue K+ supplement as patient hypokalemic and is on diuretic. Recheck labs in am.  6. Atrial Fibrillation: monitor HR with bid check. Continue coreg and cardizem for HR control. Continue xarelto.  7. Morbid obesity: Will have dietician educate on heart healthy diet.  8. ?CKD: baseline Cr was 1.35 in 9/11 and 1.6 at admission. Will monitor along as now with ace and diuretic on board. Recheck labs in am.   Ivory Broad, MD 12/09/2012

## 2012-12-09 NOTE — Progress Notes (Addendum)
Rehab admissions - Evaluated for possible admission.  I spoke briefly with patient.  He is agreeable to inpatient rehab.  He is very sleepy this am.  I did call his mother with his permission and left a message. Patient has [employer] and we will need authorization.  I will need OT consult prior to getting authorization I suspect.  I will contact [facility] and seek precertification for inpatient rehab admission.  Call me for questions.  #[phone]

## 2012-12-09 NOTE — Evaluation (Signed)
Speech Language Pathology Evaluation Patient Details Name: Thomas [name] MRN: [id number] DOB: [birth year] Today's Date: 12/09/2012 Time: 4098-1191 SLP Time Calculation (min): 26 min  Problem List:  Patient Active Problem List  Diagnosis  . Overweight  . HYPERTENSION, BENIGN ESSENTIAL, UNCONTROLLED  . PAROXYSMAL SUPRAVENTRICULAR TACHYCARDIA  . Atrial fibrillation  . VENTRICULAR HYPERTROPHY, LEFT  . HEMATURIA UNSPECIFIED  . CARDIOMYOPATHY  . RENAL INSUFFICIENCY  . PALPITATIONS  . Cerebral embolism with cerebral infarction  . Hemiplegia, unspecified, affecting nondominant side  . Atrial fibrillation with RVR  . HTN (hypertension), malignant  . Hypokalemia  . Morbid obesity with body mass index of 40.0-44.9 in adult   Past Medical History:  Past Medical History  Diagnosis Date  . Cardiomyopathy 11/28/2010  . Palpitation 11/28/2010  . Atrial fibrillation 10/29/2010  . Benign essential hypertension 12/15/2008  . Paroxysmal SVT (supraventricular tachycardia) 12/15/2008  . 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 Renal insufficiency 11/28/2010  . Overweight 08/23/2010  . Hematuria, unspecified 12/15/2008   Past Surgical History: History reviewed. No pertinent past surgical history. HPI:  32 year old male admitted 12/07/12 due to sudden onset left sided weakness and slurred speech. tPA given. Pt passed RN stroke swallow screen, and is tolerating regular diet.   Assessment / Plan / Recommendation Clinical Impression  Montreal Cognitive Assessment administered (MoCA). Pt scored 26/30, which is [facility] per norm. Pt did have difficulty with delayed recall of 2/5 words, but recalled them accurately given category cue. Pt also had difficulty with serial 7 subtraction, but reports math is not a strength of his.  BSE not completed. Pt passed RN stroke swallow screen, and appears to  be tolerating current diet. Acute ST intervention not recommended at this time, however, please refer for outpatient or home health therapy following DC if needs arise.    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)   Cognition  Overall Cognitive Status: Appears within functional limits for tasks assessed Arousal/Alertness: Awake/alert    Comprehension  Auditory Comprehension Overall Auditory Comprehension: Appears within functional limits for tasks assessed Visual Recognition/Discrimination Discrimination: Within Function Limits Reading Comprehension Reading Status: Not tested    Expression Expression Primary Mode of Expression: Verbal Verbal Expression Overall Verbal Expression: Appears within functional limits for tasks assessed Written Expression Dominant Hand: Left Written Expression: Unable to assess (comment) (LUE weakness)   Oral / Motor Oral Motor/Sensory Function Overall Oral Motor/Sensory Function: Appears within functional limits for tasks assessed Motor Speech Overall Motor Speech: Appears within functional limits for tasks assessed   Celia B. Murvin Natal [facility], CCC-SLP [phone] [place] 12/09/2012, 1:59 PM

## 2012-12-09 NOTE — Progress Notes (Signed)
Patient admitted to 4029 at 1700. AO4, no c/o pain. Oriented to unit, call bell system, rehab schedule, and safety plan. Patient verbalized understanding. Thomas [name]

## 2012-12-09 NOTE — PMR Pre-admission (Signed)
PMR Admission Coordinator Pre-Admission Assessment  Patient: Thomas [name] is an 32 y.o., male MRN: [id number] DOB: [birth year] Height: 6\' 1"  (185.4 cm) Weight: 154.223 kg (340 lb)              Insurance Information HMO:      PPO: Yes     PCP:       IPA:       80/20:       OTHER:   PRIMARY: [employer]MR      Policy#: [id number]      Subscriber: Edwinna Areola CM Name: Kasandra Knudsen.      Phone#: [phone]     Fax#: [phone] Pre-Cert#: [id number]  Update due 12/16/12      Employer: [employer] Benefits:  Phone #: [phone]     Name: Davene Costain. Date: 12/02/12     Deduct: $750      Out of Pocket Max: $4000      Life Max: unlimited CIR: w/auth 80%      SNF: w/auth 80%  Max 120 days Outpatient: 80%  Max 24 visits/yr     Co-Pay: 20% Home Health: w/auth 70% no visit limit      Co-Pay: 30% DME: 60% with auth >$500 rental or purchase     Co-Pay: 40% Providers: in network with [facility]  Emergency Contact Information Contact Information    Name Relation Home Work [place] Other [phone]  [phone]     Current Medical History  Patient Admitting Diagnosis:  R MCA infarct, likely embolic    History of Present Illness:  A 32 y.o. LH-male with history of CM, atrial fibrillation, morbid obesity, who was admitted on 12/07/12 with left sided weakness and slurred speech. BP markedly elevated 212/132 and patient with history of non compliance with antihypertensives and ASA for past 9 months. CT head with indistinctness in right periopercular region and once BP stabilized, he was treated with tPA. Follow up CCT with evolving cytotoxic edema suggesting early R-MCA infarct and no visible hemorrhage past tPA. Neurology following and workup underway. MD recommending CIR.    Total: 2 =NIH  Past Medical History  Past Medical History  Diagnosis Date  . Cardiomyopathy 11/28/2010  . Palpitation 11/28/2010  . Atrial fibrillation 10/29/2010  . Benign essential hypertension  12/15/2008  . Paroxysmal SVT (supraventricular tachycardia) 12/15/2008  . 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 Renal insufficiency 11/28/2010  . Overweight 08/23/2010  . Hematuria, unspecified 12/15/2008    Family History  family history includes Diabetes in his mother; Glaucoma in his father; and Hypertension in his father and mother.  Prior Rehab/Hospitalizations: No previous rehab admissions.   Current Medications  Current facility-administered medications:0.9 %  sodium chloride infusion, , Intravenous, Continuous, Layne Benton, NP, Last Rate: 100 mL/hr at 12/08/12 1912;  acetaminophen (TYLENOL) suppository 650 mg, 650 mg, Rectal, Q4H PRN, Thana Farr, MD;  acetaminophen (TYLENOL) tablet 650 mg, 650 mg, Oral, Q4H PRN, Thana Farr, MD, 650 mg at 12/09/12 0558 carvedilol (COREG) tablet 25 mg, 25 mg, Oral, BID WC, Gaylord Shih, MD, 25 mg at 12/09/12 1044;  cloNIDine (CATAPRES) tablet 0.1 mg, 0.1 mg, Oral, Q2H PRN, Dolores Patty, MD;  diltiazem (CARDIZEM CD) 24 hr capsule 240 mg, 240 mg, Oral, Daily, Rhonda G Barrett, PA, 240 mg at 12/09/12 1044;  hydrALAZINE (APRESOLINE) injection 10 mg, 10 mg, Intravenous, Q4H  PRN, Joline Salt Barrett, PA, 10 mg at 12/09/12 4098 hydrochlorothiazide (HYDRODIURIL) tablet 25 mg, 25 mg, Oral, Daily, Layne Benton, NP, 25 mg at 12/09/12 1044;  irbesartan (AVAPRO) tablet 300 mg, 300 mg, Oral, Daily, Layne Benton, NP, 300 mg at 12/09/12 1044;  niCARdipine (CARDENE-IV) infusion (0.1 mg/ml), 3-15 mg/hr, Intravenous, Continuous, Layne Benton, NP, 3 mg/hr at 12/08/12 1300;  ondansetron (ZOFRAN) injection 4 mg, 4 mg, Intravenous, Q6H PRN, Thana Farr, MD potassium chloride SA (K-DUR,KLOR-CON) CR tablet 20 mEq, 20 mEq, Oral, Daily, Layne Benton, NP, 20 mEq at 12/09/12 1044;  Rivaroxaban (XARELTO) tablet 20 mg, 20 mg, Oral, Q supper, Layne Benton, NP, 20 mg at 12/08/12 1748;  senna-docusate (Senokot-S) tablet 1 tablet, 1 tablet, Oral, QHS PRN, Thana Farr, MD;  spironolactone (ALDACTONE) tablet 25 mg, 25 mg, Oral, Daily, Dolores Patty, MD, 25 mg at 12/09/12 1044  Patients Current Diet: Cardiac  Precautions / Restrictions Precautions Precautions: Fall Restrictions Weight Bearing Restrictions: No   Prior Activity Level Community (5-7x/wk): Worked FT.  Went out daily.  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: Pt works at [employer] as a Investment banker, operational  Arousal/Alertness: Awake/alert Overall Cognitive Status: Appears within functional limits for tasks assessed Overall Cognitive Status: Impaired Current Attention Level: Sustained Attention - Other Comments: Lethargic decrease attention Orientation Level: Oriented X4 Safety/Judgement: Decreased awareness of safety precautions    Extremity Assessment (includes Sensation/Coordination)  RUE ROM/Strength/Tone: Within functional levels RUE Coordination: WFL - gross/fine motor  RLE ROM/Strength/Tone: Within functional levels RLE Sensation: WFL - Light Touch    ADLs  Eating/Feeding: Performed;Set up (with Right hand) Where Assessed - Eating/Feeding: Chair Grooming: Performed;Wash/dry hands;Wash/dry face;Set up Where Assessed - Grooming: Unsupported sitting Upper Body Bathing: Moderate assistance;Simulated Where Assessed - Upper Body Bathing: Unsupported sitting Lower Body Bathing: +1 Total assistance;Simulated Where Assessed - Lower Body Bathing: Supported sit to stand Upper Body Dressing: Performed;Maximal assistance Where Assessed - Upper Body Dressing: Unsupported sitting Lower  Body Dressing: Simulated;+1 Total assistance Where Assessed - Lower Body Dressing: Unsupported sitting;Supported sit to stand Equipment Used: Gait belt;Rolling walker Transfers/Ambulation Related to ADLs: required assist to maintain R hand on walker, +2 total assist pt 70% to ambulate to door ADL Comments: Instructed pt in hemitechniques for dressing.    Mobility  Bed Mobility: Supine to Sit;Sitting - Scoot to Edge of Bed Supine to Sit: 5: Supervision Sitting - Scoot to Edge of Bed: 5: Supervision Sit to Supine: 3: Mod assist    Transfers  Transfers: Sit to Stand;Stand to Sit Sit to Stand: 1: +2 Total assist;With upper extremity assist;From bed Sit to Stand: Patient Percentage: 70% Stand to Sit: 1: +2 Total assist;With armrests;With upper extremity assist;To chair/3-in-1 Stand to Sit: Patient Percentage: 60%    Ambulation / Gait / Stairs / Wheelchair Mobility  Ambulation/Gait Ambulation/Gait Assistance: 1: +2 Total assist Ambulation/Gait: Patient Percentage: 70% Ambulation Distance (Feet): 15 Feet Assistive device: Rolling walker Ambulation/Gait Assistance Details: Cues for RW advancement, LE sequencing, & technique.  (A) for balance, keep LUE positioned on RW due to pt with difficulty keeping grasp on grip.  Pt with decreased stride & decreased floor clearance L>R.   Gait Pattern: Step-through pattern;Decreased stride length;Decreased step length - right;Decreased step length - left;Decreased weight shift to right;Decreased  hip/knee flexion - left Stairs: No Wheelchair Mobility Wheelchair Mobility: No    Posture / Balance Static Sitting Balance Static Sitting - Balance Support: Feet supported Static Sitting - Level of Assistance: 5: Stand by assistance Static Standing Balance Static Standing - Balance Support: Left upper extremity supported Static Standing - Level of Assistance: 3: Mod assist Static Standing - Comment/# of Minutes: 2 minutes.  (A) to maintain standing midline.   Still with Lt lateral lean.   Dynamic Standing Balance Dynamic Standing - Balance Support: Bilateral upper extremity supported Dynamic Standing - Level of Assistance: 1: +2 Total assist (50%) Dynamic Standing - Balance Activities: Lateral lean/weight shifting;Forward lean/weight shifting (attempted side step but unable to complete)    Special needs/care consideration BiPAP/CPAP No CPM NO Continuous Drip IV No Dialysis No       Life Vest No Oxygen No Special Bed No Trach Size No Wound Vac (area) No      Skin No                              Bowel mgmt: Last BM on 12/07/12 Bladder mgmt: Using urinal Diabetic mgmt No     Previous Home Environment Living Arrangements: Spouse/significant other Lives With: Alone Type of Home: Apartment Home Layout: One level Home Access: Stairs to enter Entrance Stairs-Rails: Right;Left;Can reach both Secretary/administrator of Steps: 16 Bathroom Shower/Tub: Engineer, manufacturing systems: Standard Bathroom Accessibility: Yes How Accessible: Accessible via walker Home Care Services: No Additional Comments: Need to further assess caregiver support.  Girlfriend from [place] and family currently on there way from [place], [place].  Discharge Living Setting Plans for Discharge Living Setting: Alone;Apartment (Mom plans to come stay with patient.) Type of Home at Discharge: Apartment Discharge Home Layout: One level Discharge Home Access: Stairs to enter Entrance Stairs-Number of Steps: 16 Do you have any problems obtaining your medications?: No  Social/Family/Support Systems Contact Information: Alf Doyle - mother (h) [phone] ([phone] Anticipated Caregiver: Mother Ability/Limitations of Caregiver: Mother works from home and plans to come stay with patient after rehab discharge Caregiver Availability: 24/7 Discharge Plan Discussed with Primary Caregiver: Yes Is Caregiver In Agreement with Plan?: Yes Does Caregiver/Family have  Issues with Lodging/Transportation while Pt is in Rehab?: No  Goals/Additional Needs Patient/Family Goal for Rehab: ? S PT/OT, no ST needs identified Expected length of stay: 1-2 weeks Cultural Considerations: None Dietary Needs: Heart diet Equipment Needs: TBD Pt/Family Agrees to Admission and willing to participate: Yes Program Orientation Provided & Reviewed with Pt/Caregiver Including Roles  & Responsibilities: Yes  Decrease burden of Care through IP rehab admission: Not applicable  Possible need for SNF placement upon discharge: Yes, but only if family is not able to provide supervision at discharge.   Patient Condition: This patient's condition remains as documented in the Consult dated 12/08/12, in which the Rehabilitation Physician determined and documented that the patient's condition is appropriate for intensive rehabilitative care in an inpatient rehabilitation facility.  Preadmission Screen Completed By:  Trish Mage, 12/09/2012 1:53 PM ______________________________________________________________________   Discussed status with Dr. Riley Kill on 12/09/12 at 1400 and received telephone approval for admission today.  Admission Coordinator:  Trish Mage, time1400/Date01/08/14

## 2012-12-10 ENCOUNTER — Inpatient Hospital Stay (HOSPITAL_COMMUNITY): Payer: 59 | Admitting: Occupational Therapy

## 2012-12-10 ENCOUNTER — Inpatient Hospital Stay (HOSPITAL_COMMUNITY): Payer: 59 | Admitting: Speech Pathology

## 2012-12-10 ENCOUNTER — Inpatient Hospital Stay (HOSPITAL_COMMUNITY): Payer: 59 | Admitting: *Deleted

## 2012-12-10 DIAGNOSIS — I6340 Cerebral infarction due to embolism of unspecified cerebral artery: Secondary | ICD-10-CM

## 2012-12-10 DIAGNOSIS — G8110 Spastic hemiplegia affecting unspecified side: Secondary | ICD-10-CM

## 2012-12-10 DIAGNOSIS — I4891 Unspecified atrial fibrillation: Secondary | ICD-10-CM

## 2012-12-10 LAB — CBC WITH DIFFERENTIAL/PLATELET
Basophils Relative: 0 % (ref 0–1)
Eosinophils Absolute: 0.1 10*3/uL (ref 0.0–0.7)
Hemoglobin: 14.5 g/dL (ref 13.0–17.0)
MCH: 28.9 pg (ref 26.0–34.0)
MCHC: 33.8 g/dL (ref 30.0–36.0)
Monocytes Relative: 8 % (ref 3–12)
Neutro Abs: 3.7 10*3/uL (ref 1.7–7.7)
Neutrophils Relative %: 54 % (ref 43–77)
Platelets: 172 10*3/uL (ref 150–400)
RBC: 5.01 MIL/uL (ref 4.22–5.81)

## 2012-12-10 LAB — COMPREHENSIVE METABOLIC PANEL
ALT: 26 U/L (ref 0–53)
AST: 28 U/L (ref 0–37)
Albumin: 3.5 g/dL (ref 3.5–5.2)
Alkaline Phosphatase: 43 U/L (ref 39–117)
Chloride: 101 mEq/L (ref 96–112)
Potassium: 3.9 mEq/L (ref 3.5–5.1)
Sodium: 138 mEq/L (ref 135–145)
Total Bilirubin: 0.7 mg/dL (ref 0.3–1.2)
Total Protein: 7.4 g/dL (ref 6.0–8.3)

## 2012-12-10 MED ORDER — CARVEDILOL 12.5 MG PO TABS
12.5000 mg | ORAL_TABLET | Freq: Two times a day (BID) | ORAL | Status: DC
  Administered 2012-12-10 – 2012-12-18 (×18): 12.5 mg via ORAL
  Filled 2012-12-10 (×20): qty 1

## 2012-12-10 MED ORDER — SPIRONOLACTONE 50 MG PO TABS
50.0000 mg | ORAL_TABLET | Freq: Every day | ORAL | Status: DC
  Administered 2012-12-10 – 2012-12-15 (×6): 50 mg via ORAL
  Filled 2012-12-10 (×9): qty 1

## 2012-12-10 NOTE — Care Management Note (Signed)
[facility]patient [facility] Individual Statement of Services  Patient Name:  Thomas [name]  Date:  12/10/2012  Welcome to the [facility].  Our goal is to provide you with an individualized program based on your diagnosis and situation, designed to meet your specific needs.  With this comprehensive rehabilitation program, you will be expected to participate in at least 3 hours of rehabilitation therapies Monday-Friday, with modified therapy programming on the weekends.  Your rehabilitation program will include the following services:  Physical Therapy (PT), Occupational Therapy (OT), 24 hour per day rehabilitation nursing, Neuropsychology, Case Management (RN and Social Worker), Rehabilitation Medicine, Nutrition Services and Pharmacy Services  Weekly team conferences will be held on Wednesday to discuss your progress.  Your RN Case Designer, television/film set will talk with you frequently to get your input and to update you on team discussions.  Team conferences with you and your family in attendance may also be held.  Expected length of stay: 7-10 days Overall anticipated outcome: supervision/mod/i level  Depending on your progress and recovery, your program may change.  Your RN Case Estate agent will coordinate services and will keep you informed of any changes.  Your RN Sports coach and SW names and contact numbers are listed  below.  The following services may also be recommended but are not provided by the Inpatient Rehabilitation Center:   Driving Evaluations  Home Health Rehabiltiation Services  Outpatient Rehabilitatation [facility]  Vocational Rehabilitation   Arrangements will be made to provide these services after discharge if needed.  Arrangements include referral to agencies that provide these services.  Your insurance has been verified to be:  [facility] Your primary doctor is:  None  Pertinent information will be shared with your doctor and your  insurance company.   Social Worker:  Dossie Der, [place] [phone]  Information discussed with and copy given to patient by: Lucy Chris, 12/10/2012, 1:14 PM

## 2012-12-10 NOTE — Progress Notes (Signed)
Patient ID: Thomas [name], male   DOB: 10-05-1981, 32 y.o.   MRN: [id number] Thomas [name] is an 32 y.o. male with a history of hypertension and atrial fibrillation who was at home and went to the bathroom with no complaints. While in the bathroom developed slurred speech and left sided weakness. EMS was called at that time and the patient was brought in 12/07/2012 as a code stroke. While in the CT symptoms improved somewhat but patient did not return to baseline. BP was markedly elevated. Patient reported being noncompliant with his antihypertensive and ASA for 9 months. He received IV TPA.   Subjective/Complaints: Slept well No dizziness Spoke with RN , needed prn clonidine due to elevated BP  Objective: Vital Signs: Blood pressure 170/80, pulse 66, temperature 98.1 F (36.7 C), temperature source Oral, resp. rate 19, height 6\' 1"  (1.854 m), weight 147.328 kg (324 lb 12.8 oz), SpO2 99.00%. Mr Brain Wo Contrast  12/08/2012  *RADIOLOGY REPORT*  Clinical Data:  Stroke.  Post t-PA.  MRI HEAD WITHOUT CONTRAST MRA HEAD WITHOUT CONTRAST  Technique:  Multiplanar, multiecho pulse sequences of the brain and surrounding structures were obtained without intravenous contrast. Angiographic images of the head were obtained using MRA technique without contrast.  Comparison:  CT 12/07/2012  MRI HEAD  Findings:  Acute infarct in the right posterior insula and right parietal lobe.  This is in the right MCA territory and  is approximately one third of the right MCA territory.  No associated hemorrhage.  No midline shift.  No other acute infarct.  Mild chronic microvascular ischemia in the deep white matter on the left.  The patient was not able to complete the study however the study is diagnostic for acute infarction.  IMPRESSION: Acute infarct right posterior insula and right parietal lobe.  No associated hemorrhage or midline shift.  MRA HEAD  Findings: Both vertebral arteries are patent to the basilar.  The basilar is  widely patent.  Fetal origin of the posterior cerebral artery bilaterally.  Superior cerebellar and posterior cerebral arteries are patent bilaterally without stenosis.  Internal carotid artery is patent bilaterally without stenosis. Anterior and middle cerebral arteries are patent.  In particular, the posterior division of the right middle cerebral artery is patent without filling defect or occlusion.  Negative for cerebral aneurysm.  IMPRESSION: Negative.  Apparent recanalization of the right middle cerebral artery branch causing the acute infarct.   Original Report Authenticated By: Janeece Riggers, M.D.    Mr Mra Head/brain Wo Cm  12/08/2012  *RADIOLOGY REPORT*  Clinical Data:  Stroke.  Post t-PA.  MRI HEAD WITHOUT CONTRAST MRA HEAD WITHOUT CONTRAST  Technique:  Multiplanar, multiecho pulse sequences of the brain and surrounding structures were obtained without intravenous contrast. Angiographic images of the head were obtained using MRA technique without contrast.  Comparison:  CT 12/07/2012  MRI HEAD  Findings:  Acute infarct in the right posterior insula and right parietal lobe.  This is in the right MCA territory and  is approximately one third of the right MCA territory.  No associated hemorrhage.  No midline shift.  No other acute infarct.  Mild chronic microvascular ischemia in the deep white matter on the left.  The patient was not able to complete the study however the study is diagnostic for acute infarction.  IMPRESSION: Acute infarct right posterior insula and right parietal lobe.  No associated hemorrhage or midline shift.  MRA HEAD  Findings: Both vertebral arteries are patent to the basilar.  The  basilar is widely patent.  Fetal origin of the posterior cerebral artery bilaterally.  Superior cerebellar and posterior cerebral arteries are patent bilaterally without stenosis.  Internal carotid artery is patent bilaterally without stenosis. Anterior and middle cerebral arteries are patent.  In  particular, the posterior division of the right middle cerebral artery is patent without filling defect or occlusion.  Negative for cerebral aneurysm.  IMPRESSION: Negative.  Apparent recanalization of the right middle cerebral artery branch causing the acute infarct.   Original Report Authenticated By: Janeece Riggers, M.D.    Results for orders placed during the hospital encounter of 12/09/12 (from the past 72 hour(s))  CBC WITH DIFFERENTIAL     Status: Normal   Collection Time   12/10/12  5:55 AM      Component Value Range Comment   WBC 6.9  4.0 - 10.5 K/uL    RBC 5.01  4.22 - 5.81 MIL/uL    Hemoglobin 14.5  13.0 - 17.0 g/dL    HCT 16.1  09.6 - 04.5 %    MCV 85.6  78.0 - 100.0 fL    MCH 28.9  26.0 - 34.0 pg    MCHC 33.8  30.0 - 36.0 g/dL    RDW 40.9  81.1 - 91.4 %    Platelets 172  150 - 400 K/uL    Neutrophils Relative 54  43 - 77 %    Neutro Abs 3.7  1.7 - 7.7 K/uL    Lymphocytes Relative 36  12 - 46 %    Lymphs Abs 2.5  0.7 - 4.0 K/uL    Monocytes Relative 8  3 - 12 %    Monocytes Absolute 0.6  0.1 - 1.0 K/uL    Eosinophils Relative 1  0 - 5 %    Eosinophils Absolute 0.1  0.0 - 0.7 K/uL    Basophils Relative 0  0 - 1 %    Basophils Absolute 0.0  0.0 - 0.1 K/uL   COMPREHENSIVE METABOLIC PANEL     Status: Abnormal   Collection Time   12/10/12  5:55 AM      Component Value Range Comment   Sodium 138  135 - 145 mEq/L    Potassium 3.9  3.5 - 5.1 mEq/L    Chloride 101  96 - 112 mEq/L    CO2 21  19 - 32 mEq/L    Glucose, Bld 91  70 - 99 mg/dL    BUN 20  6 - 23 mg/dL    Creatinine, Ser 7.82 (*) 0.50 - 1.35 mg/dL    Calcium 9.6  8.4 - 95.6 mg/dL    Total Protein 7.4  6.0 - 8.3 g/dL    Albumin 3.5  3.5 - 5.2 g/dL    AST 28  0 - 37 U/L    ALT 26  0 - 53 U/L    Alkaline Phosphatase 43  39 - 117 U/L    Total Bilirubin 0.7  0.3 - 1.2 mg/dL    GFR calc non Af Amer 56 (*) >90 mL/min    GFR calc Af Amer 65 (*) >90 mL/min      HEENT: normal. Physical Exam  Constitutional: He is oriented  to person, place, and time. He appears well-developed and well-nourished.  HENT:  Head: Normocephalic and atraumatic.  Right Ear: External ear normal.  Left Ear: External ear normal.  Nose: Nose normal.  Eyes: Conjunctivae normal and EOM are normal. Pupils are equal, round, and reactive to light.  Neck: Normal range of motion. Neck supple.  Cardiovascular: Exam reveals no gallop.   No murmur heard. Pulmonary/Chest: Effort normal and breath sounds normal.  Abdominal: Soft. Bowel sounds are normal. He exhibits no distension.  Musculoskeletal: He exhibits edema. He exhibits no tenderness.  Neurological: He is alert and oriented to person, place, and time. He has normal reflexes.  Skin: Skin is warm and dry.  Psychiatric: He has a normal mood and affect. His behavior is normal. Judgment and thought content normal.  3/5 Left deltoid biceps, triceps and 2/5 grip, 3/5 L HF, KE and ankle DF/PF  Assessment/Plan: 1. 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 Physiatrist is providing close team supervision and 24 hour management of active medical problems listed below. Physiatrist and rehab team continue to assess barriers to discharge/monitor patient progress toward functional and medical goals. FIM:                   Comprehension Comprehension Mode: Auditory Comprehension: 7-Follows complex conversation/direction: With no assist  Expression Expression Mode: Verbal Expression: 7-Expresses complex ideas: With no assist  Social Interaction Social Interaction: 7-Interacts appropriately with others - No medications needed.  Problem Solving Problem Solving: 7-Solves complex problems: Recognizes & self-corrects  Memory Memory: 7-Complete Independence: No helper  2. Anticoagulation/DVT prophylaxis with Pharmaceutical: Xarelto 3. Pain Management:  tylenol  Medical Problem List and Plan:  1. DVT Prophylaxis/Anticoagulation: Pharmaceutical:Xarelto, also for secondary stroke prevention  2. Pain Management: N/A  3. Mood: Seems to have a good outlook.  4. Neuropsych: This patient is capable of making decisions on his/her own behalf.  5 HTN: Monitor with bid checks. Continue coreg, Cardizem, Avapro and HCTZ. Continue K+ supplement as patient hypokalemic and is on diuretic. Recheck labs in am.  6. Atrial Fibrillation: monitor HR with bid check. Continue coreg and cardizem for HR control. Continue xarelto.  7. Morbid obesity: Will have dietician educate on heart healthy diet.  8. ?CKD: baseline Cr was 1.35 in 9/11 and 1.6 at admission. Will monitor along as now with ace and diuretic on board. Recheck labs in am  LOS (Days) 1 A FACE TO FACE EVALUATION WAS PERFORMED  Krislynn Gronau E 12/10/2012, 8:21 AM

## 2012-12-10 NOTE — Care Management Note (Signed)
    Page 1 of 1   12/10/2012     8:15:36 AM   CARE MANAGEMENT NOTE 12/10/2012  Patient:  Thomas [name], Thomas [name]   Account Number:  [id number]  Date Initiated:  12/09/2012  Documentation initiated by:  Jacquelynn Cree  Subjective/Objective Assessment:   Admitted with CVA, received tPA     Action/Plan:   PT/OT/ST-recommending inpatient rehab   Anticipated DC Date:  12/09/2012   Anticipated DC Plan:  IP REHAB FACILITY      DC Planning Services  CM consult      PAC Choice  IP REHAB   Choice offered to / List presented to:             Status of service:  Completed, signed off Medicare Important Message given?   (If response is "NO", the following Medicare IM given date fields will be blank) Date Medicare IM given:   Date Additional Medicare IM given:    Discharge Disposition:  IP REHAB FACILITY  Per UR Regulation:  Reviewed for med. necessity/level of care/duration of stay  If discussed at Long Length of Stay Meetings, dates discussed:    Comments:

## 2012-12-10 NOTE — Evaluation (Signed)
Physical Therapy Assessment and Plan  Patient Details  Name: Thomas [name] MRN: [id number] Date of Birth: [birth year]  PT Diagnosis: Abnormality of gait, Coordination disorder, Hemiplegia dominant, Hypertonia, Impaired sensation and Muscle weakness Rehab Potential: Excellent ELOS: 7-10 days   Today's Date: 12/10/2012 Time: 9528-4132 Time Calculation (min): 60 min  Problem List:  Patient Active Problem List  Diagnosis  . Overweight  . HYPERTENSION, BENIGN ESSENTIAL, UNCONTROLLED  . PAROXYSMAL SUPRAVENTRICULAR TACHYCARDIA  . Atrial fibrillation  . VENTRICULAR HYPERTROPHY, LEFT  . HEMATURIA UNSPECIFIED  . CARDIOMYOPATHY  . RENAL INSUFFICIENCY  . PALPITATIONS  . Cerebral embolism with cerebral infarction  . Hemiplegia, unspecified, affecting nondominant side  . Atrial fibrillation with RVR  . HTN (hypertension), malignant  . Hypokalemia  . Morbid obesity with body mass index of 40.0-44.9 in adult  . CVA (cerebral infarction)  . Hemiplegia affecting dominant side  . Noncompliance with medications    Past Medical History:  Past Medical History  Diagnosis Date  . Cardiomyopathy 11/28/2010  . Palpitation 11/28/2010  . Atrial fibrillation 10/29/2010  . Benign essential hypertension 12/15/2008  . Paroxysmal SVT (supraventricular tachycardia) 12/15/2008  . 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 Renal insufficiency 11/28/2010  . Overweight 08/23/2010  . Hematuria, unspecified 12/15/2008   Past Surgical History: No past surgical history on file.  Assessment & Plan Clinical Impression:  Thomas [name] is a 32 y.o. LH-male with history of CM, atrial fibrillation, morbid obesity, who was admitted on 12/07/12 with left sided weakness and slurred speech. BP markedly elevated 212/132 and patient with history of non compliance with antihypertensives and ASA for past 9  months (did feel he needed them). CT head with indistinctness in right periopercular region and once BP stabilized, he was treated with tPA. Follow up CCT with evolving cytotoxic edema suggesting early R-MCA infarct and no visible hemorrhage past tPA. Dr. Gala Romney consulted for input on atrial fibrillation and BP. He recommended resuming home meds and addition of Cardizem for rate control. Will need anticoagulation as well as outpatient sleep study. 2D echo done revealing EF 50-55% with dilated left atrium and severe concentric hypertrophy. Carotid dopplers without ICA stenosis. Neurology recommended Xarelto for CVA prophylaxis and to help with compliance. Patient continues with left sided weakness with decreased balance. Patient transferred to CIR on 12/09/2012 .   Patient currently requires min with mobility secondary to muscle weakness, decreased cardiorespiratoy endurance, impaired timing and sequencing, abnormal tone and decreased coordination, decreased visual perceptual skills and decreased attention to left.  Prior to hospitalization, patient was independent  with mobility and lived with Alone in a Apartment home.  Home access is 16Stairs to enter.  Patient will benefit from skilled PT intervention to maximize safe functional mobility, minimize fall risk and decrease caregiver burden for planned discharge home alone (Patient's parents will come to live with him upon d/c or he will go stay at his parents upon d/c).  Anticipate patient will benefit from follow up OP at discharge.  PT - End of Session Activity Tolerance: Endurance does not limit participation in activity Endurance Deficit: No PT Assessment Rehab Potential: Excellent Barriers to Discharge: None PT Plan PT Intensity: Minimum of 1-2 x/day ,45 to 90 minutes PT Frequency: 5 out of 7 days PT Duration Estimated Length of Stay: 7-10 days PT Treatment/Interventions: Ambulation/gait training;Balance/vestibular training;Community  reintegration;DME/adaptive equipment instruction;Discharge planning;Functional mobility training;Neuromuscular re-education;Pain management;Patient/family education;Psychosocial support;Therapeutic Exercise;Therapeutic  Activities;Stair training;UE/LE Strength taining/ROM;UE/LE Coordination activities;Wheelchair propulsion/positioning PT Recommendation Follow Up Recommendations: Outpatient PT Patient destination: Home (home with mom staying with him or he will go to mom's house) Equipment Recommended: None recommended by PT  PT Evaluation Precautions/Restrictions Precautions Precautions: Fall Restrictions Weight Bearing Restrictions: No General Chart Reviewed: Yes  Vital SignsTherapy Vitals Pulse Rate: 57  Patient Position, if appropriate: Sitting Oxygen Therapy SpO2: 97 % O2 Device: None (Room air) Pulse Oximetry Type: Intermittent Pain Pain Assessment Pain Assessment: No/denies pain Pain Score: 0-No pain Home Living/Prior Functioning Home Living Lives With: Alone Available Help at Discharge: Family;Friend(s) Type of Home: Apartment Home Access: Stairs to enter [employer] of Steps: 16 Entrance Stairs-Rails: Can reach both;Left;Right Home Layout: One level Bathroom Shower/Tub: Engineer, manufacturing systems: Standard Bathroom Accessibility: Yes How Accessible: Accessible via walker Home Adaptive Equipment: None Additional Comments: Pt's mother is coming from [place] and plans to stay to provide assistance as needed or he can go home with her for a period of time if needed Prior Function Level of Independence: Independent with basic ADLs;Independent with homemaking with ambulation;Independent with transfers;Independent with gait Able to Take Stairs?: Yes Driving: Yes 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 Leisure: Hobbies-yes  (Comment) Comments: Drawing with L hand Vision/Perception  Vision - History Baseline Vision: Wears glasses all the time (Near sighted.) Patient Visual Report: No change from baseline Vision - Assessment Eye Alignment: Within Functional Limits  Cognition Overall Cognitive Status: Appears within functional limits for tasks assessed Arousal/Alertness: Awake/alert Orientation Level: Oriented X4 Sensation Sensation Light Touch: Appears Intact Light Touch Impaired Details: Impaired LUE Proprioception: Appears Intact Additional Comments: Proprioception intact at B great toe and B ankles Coordination Gross Motor Movements are Fluid and Coordinated: No Fine Motor Movements are Fluid and Coordinated: No Coordination and Movement Description: Decreased speed and accuracy with rapid alternating movements of B UE and B LE. Finger Nose Finger Test: dysmetria and undershooting Heel Shin Test: Very slight dysmetria/undershooting with L LE Motor  Motor Motor: Abnormal tone Motor - Skilled Clinical Observations: Very mild hypertonia in L hamstrings  Mobility Bed Mobility Bed Mobility: Sit to Supine;Supine to Sit;Sitting - Scoot to Edge of Bed Supine to Sit: 5: Supervision Supine to Sit Details: Verbal cues for precautions/safety Sitting - Scoot to Edge of Bed: 5: Supervision Sitting - Scoot to [place] of Bed Details: Verbal cues for precautions/safety Sit to Supine: 5: Supervision Transfers Sit to Stand: 4: Min assist;From chair/3-in-1;From bed;With armrests;Without upper extremity assist Sit to Stand Details: Verbal cues for precautions/safety;Tactile cues for posture;Tactile cues for weight shifting;Verbal cues for sequencing;Verbal cues for technique Stand to Sit: 4: Min assist;With armrests;To bed;To chair/3-in-1;Without upper extremity assist Stand to Sit Details (indicate cue type and reason): Tactile cues for weight shifting;Tactile cues for posture;Verbal cues for sequencing;Verbal cues  for technique;Verbal cues for precautions/safety Car Transfer: 4: Min assist; Verbal cues for sequencing and technique Locomotion  Ambulation Ambulation: Yes Ambulation/Gait Assistance: 4: Min assist Ambulation Distance (Feet): 140 Feet Assistive device: None Ambulation/Gait Assistance Details: Tactile cues for posture;Verbal cues for gait pattern;Verbal cues for precautions/safety;Tactile cues for weight shifting Ambulation/Gait Assistance Details: Min assist required for balance and upright posture. Gait Gait: Yes Gait Pattern: Step-through pattern;Decreased stance time - left Stairs / Additional Locomotion Stairs: Yes Stairs Assistance: 4: Min assist Stairs Assistance Details: Verbal cues for gait pattern;Verbal cues for precautions/safety;Verbal cues for technique;Verbal cues for sequencing;Tactile cues for posture Stair Management Technique:  One rail Right;Alternating pattern;Step to pattern;Forwards Number of Stairs: 10  Height of Stairs: 6  Wheelchair Mobility Wheelchair Mobility: No Distance: 0  Trunk/Postural Assessment  Cervical Assessment Cervical Assessment: Within Functional Limits Thoracic Assessment Thoracic Assessment: Within Functional Limits Lumbar Assessment Lumbar Assessment: Within Functional Limits Postural Control Postural Control: Within Functional Limits  Balance Balance Balance Assessed: Yes Standardized Balance Assessment Standardized Balance Assessment: Berg Balance Test Berg Balance Test Sit to Stand: Able to stand without using hands and stabilize independently Standing Unsupported: Able to stand 2 minutes with supervision Sitting with Back Unsupported but Feet Supported on Floor or Stool: Able to sit safely and securely 2 minutes Stand to Sit: Sits safely with minimal use of hands Transfers: Able to transfer safely, minor use of hands Standing Unsupported with Eyes Closed: Able to stand 10 seconds with supervision Standing Ubsupported with  Feet Together: Able to place feet together independently but unable to hold for 30 seconds From Standing, Reach Forward with Outstretched Arm: Can reach forward >12 cm safely (5") From Standing Position, Pick up Object from Floor: Able to pick up shoe, needs supervision From Standing Position, Turn to Look Behind Over each Shoulder: Looks behind from both sides and weight shifts well Turn 360 Degrees: Able to turn 360 degrees safely in 4 seconds or less Standing Unsupported, Alternately Place Feet on Step/Stool: Able to stand independently and safely and complete 8 steps in 20 seconds Standing Unsupported, One Foot in Front: Able to plae foot ahead of the other independently and hold 30 seconds Standing on One Leg: Able to lift leg independently and hold 5-10 seconds Total Score: 48/56 indicating patient is at a moderate risk (>50%) for falls. Static Sitting Balance Static Sitting - Balance Support: Feet supported;No upper extremity supported Static Sitting - Level of Assistance: 5: Stand by assistance Static Standing Balance Static Standing - Balance Support: No upper extremity supported Static Standing - Level of Assistance: 4: Min assist Extremity Assessment  RLE Assessment RLE Assessment: Within Functional Limits LLE Assessment LLE Assessment: Exceptions to [facility] LLE Strength LLE Overall Strength: Within Functional Limits for tasks assessed;Deficits LLE Overall Strength Comments: Grossly 5/5 except ankle DF/PF 4/5 FIM:  FIM - Bed/Chair Transfer Bed/Chair Transfer Assistive Devices: Arm rests Bed/Chair Transfer: 5: Supine > Sit: Supervision (verbal cues/safety issues);5: Sit > Supine: Supervision (verbal cues/safety issues);4: Bed > Chair or W/C: Min A (steadying Pt. > 75%);4: Chair or W/C > Bed: Min A (steadying Pt. > 75%) FIM - Locomotion: Wheelchair Distance: 0 Locomotion: Wheelchair: 0: Activity did not occur FIM - Locomotion: Ambulation Ambulation/Gait Assistance: 4: Min  assist Locomotion: Ambulation: 2: Travels 50 - 149 ft with minimal assistance (Pt.>75%) FIM - Locomotion: Stairs Locomotion: Building control surveyor: Hand rail - 1 Locomotion: Stairs: 2: Up and Down 4 - 11 stairs with minimal assistance (Pt.>75%)   Refer to Care Plan for Long Term Goals  Recommendations for other services: None  Discharge Criteria: Patient will be discharged from PT if patient refuses treatment 3 consecutive times without medical reason, if treatment goals not met, if there is a change in medical status, if patient makes no progress towards goals or if patient is discharged from hospital.  The above assessment, treatment plan, treatment alternatives and goals were discussed and mutually agreed upon: by patient  Chipper Herb. Mahasin Riviere, PT, DPT  12/10/2012, 4:06 PM

## 2012-12-10 NOTE — Evaluation (Signed)
Occupational Therapy Assessment and Plan  Patient Details  Name: Thomas [name] MRN: [id number] Date of Birth: Nov 09, 1981  OT Diagnosis: hemiplegia affecting dominant side and swelling of limb Rehab Potential: Rehab Potential: Excellent ELOS: 7-10 days   Today's Date: 12/10/2012 Time: 1105-1200 Time Calculation (min): 55 min  Problem List:  Patient Active Problem List  Diagnosis  . Overweight  . HYPERTENSION, BENIGN ESSENTIAL, UNCONTROLLED  . PAROXYSMAL SUPRAVENTRICULAR TACHYCARDIA  . Atrial fibrillation  . VENTRICULAR HYPERTROPHY, LEFT  . HEMATURIA UNSPECIFIED  . CARDIOMYOPATHY  . RENAL INSUFFICIENCY  . PALPITATIONS  . Cerebral embolism with cerebral infarction  . Hemiplegia, unspecified, affecting nondominant side  . Atrial fibrillation with RVR  . HTN (hypertension), malignant  . Hypokalemia  . Morbid obesity with body mass index of 40.0-44.9 in adult  . CVA (cerebral infarction)  . Hemiplegia affecting dominant side  . Noncompliance with medications    Past Medical History:  Past Medical History  Diagnosis Date  . Cardiomyopathy 11/28/2010  . Palpitation 11/28/2010  . Atrial fibrillation 10/29/2010  . Benign essential hypertension 12/15/2008  . Paroxysmal SVT (supraventricular tachycardia) 12/15/2008  . 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 Renal insufficiency 11/28/2010  . Overweight 08/23/2010  . Hematuria, unspecified 12/15/2008   Past Surgical History: No past surgical history on file.  Assessment & Plan Clinical Impression: Patient is a 32 y.o. left hand male with recent admission to the hospital on 12/07/12 with left sided weakness and slurred speech. BP markedly elevated 212/132 and patient with history of non compliance with antihypertensives and ASA for past 9 months (did feel he needed them). CT head with indistinctness in right  periopercular region and once BP stabilized, he was treated with tPA. Follow up CCT with evolving cytotoxic edema suggesting early R-MCA infarct and no visible hemorrhage past tPA. Dr. Gala Romney consulted for input on atrial fibrillation and BP. He recommended resuming home meds and addition of Cardizem for rate control. Will need anticoagulation as well as outpatient sleep study. 2D echo done revealing EF 50-55% with dilated left atrium and severe concentric hypertrophy. Carotid dopplers without ICA stenosis. Neurology recommended Xarelto for CVA prophylaxis and to help with compliance. Patient continues with left sided weakness with decreased balance .  Patient transferred to CIR on 12/09/2012 .    Patient currently requires mod with basic self-care skills secondary to decreased cardiorespiratoy endurance, abnormal tone, unbalanced muscle activation and decreased coordination, decreased attention to left and decreased postural control and decreased balance strategies.  Prior to hospitalization, patient could complete ADLs with independent .  Patient will benefit from skilled intervention to increase independence with basic self-care skills and increase level of independence with iADL prior to discharge home independently, Mother plans to come in from out of town to provide assistance if needed.  Anticipate patient will require intermittent supervision and follow up outpatient.  OT - End of Session Activity Tolerance: Tolerates 30+ min activity without fatigue Endurance Deficit: No OT Assessment Rehab Potential: Excellent OT Plan OT Intensity: Minimum of 1-2 x/day, 45 to 90 minutes OT Frequency: 5 out of 7 days OT Duration/Estimated Length of Stay: 7-10 days OT Treatment/Interventions: Balance/vestibular training;Community reintegration;Discharge planning;DME/adaptive equipment instruction;Functional electrical stimulation;Functional mobility training;Neuromuscular re-education;Pain  management;Patient/family education;Psychosocial support;Self Care/advanced ADL retraining;Therapeutic Activities;Therapeutic Exercise;UE/LE Strength taining/ROM;UE/LE Coordination activities OT Recommendation Patient destination: Home Follow Up Recommendations: Outpatient OT Equipment Recommended: None recommended by OT  OT Evaluation  Precautions/Restrictions  Precautions Precautions: Fall Pain Pain Assessment Pain Assessment: No/denies pain Pain Score: 0-No pain Home Living/Prior Functioning Home Living Lives With: Alone Type of Home: Apartment Home Access: Stairs to enter Entrance Stairs-Number of Steps: 16 Entrance Stairs-Rails: Right;Left;Can reach both Home Layout: One level Bathroom Shower/Tub: Engineer, manufacturing systems: Standard Bathroom Accessibility: Yes How Accessible: Accessible via walker Home Adaptive Equipment: None Additional Comments: Pt's mother is coming from [place] and plans to stay to provide assistance as needed or he can go home with her for a period of time if needed. 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 ADL Grooming: Minimal assistance Where Assessed-Grooming: Standing at sink Upper Body Bathing: Minimal assistance Where Assessed-Upper Body Bathing: Shower Lower Body Bathing: Minimal assistance Where Assessed-Lower Body Bathing: Shower Upper Body Dressing: Moderate assistance Where Assessed-Upper Body Dressing: Chair Lower Body Dressing: Moderate assistance Where Assessed-Lower Body Dressing:  Chair Toileting: Minimal assistance Where Assessed-Toileting: Teacher, adult education: Curator Method: Proofreader: Acupuncturist: Insurance underwriter Method: Designer, industrial/product: Transfer tub bench;Grab bars Vision/Perception  Vision - History Baseline Vision: Wears glasses all the time Patient Visual Report: Blurring of vision Vision - Assessment Eye Alignment: Within Functional Limits  Cognition   WFL, alternating and selective attention intact Sensation Sensation Light Touch: Impaired Detail Light Touch Impaired Details: Impaired LUE Proprioception: Impaired by gross assessment Coordination Gross Motor Movements are Fluid and Coordinated: No Fine Motor Movements are Fluid and Coordinated: No Coordination and Movement Description: absent FMC coordination with tip to tip Finger Nose Finger Test: dysmetria and undershooting Mobility  Bed Mobility Supine to Sit: 5: Supervision Sitting - Scoot to Edge of Bed: 5: Supervision Transfers Sit to Stand: 4: Min assist;With upper extremity assist;From bed Stand to Sit: 4: Min assist;With upper extremity assist;To chair/3-in-1;To toilet  Extremity/Trunk Assessment RUE Assessment RUE Assessment: Within Functional Limits LUE Assessment LUE Assessment: Exceptions to WFL LUE AROM (degrees) LUE Overall AROM Comments: full ROM with gravity decreased, limited shoulder mobility; elbow, wrist, and fingers WNL however require increased time and effort Left Shoulder Flexion: 90 Degrees (full with gravity eliminated) LUE Strength Left Shoulder Flexion: 3-/5 Left Elbow Flexion: 3+/5 Left Elbow Extension: 3+/5 Gross Grasp: Impaired  See FIM for current functional status Refer to Care Plan for Long Term Goals  Recommendations for other services: None  Discharge Criteria: Patient will be discharged from OT if patient refuses  treatment 3 consecutive times without medical reason, if treatment goals not met, if there is a change in medical status, if patient makes no progress towards goals or if patient is discharged from hospital.  The above assessment, treatment plan, treatment alternatives and goals were discussed and mutually agreed upon: by patient  Leonette Monarch 12/10/2012, 1:23 PM

## 2012-12-10 NOTE — Progress Notes (Signed)
Patient information reviewed and entered into [facility] system by Shweta Aman, RN, CRRN, PPS Coordinator.  Information including medical coding and functional independence measure will be reviewed and updated through discharge.    

## 2012-12-10 NOTE — Evaluation (Signed)
Speech Language Pathology Assessment and Plan  Patient Details  Name: Thomas [name] MRN: [id number] Date of Birth: 1981/01/20  Rehab Potential:  (defer OT/PT)  Today's Date: 12/10/2012 Time: 1005-1055 Time Calculation (min): 50 min  Problem List:  Patient Active Problem List  Diagnosis  . Overweight  . HYPERTENSION, BENIGN ESSENTIAL, UNCONTROLLED  . PAROXYSMAL SUPRAVENTRICULAR TACHYCARDIA  . Atrial fibrillation  . VENTRICULAR HYPERTROPHY, LEFT  . HEMATURIA UNSPECIFIED  . CARDIOMYOPATHY  . RENAL INSUFFICIENCY  . PALPITATIONS  . Cerebral embolism with cerebral infarction  . Hemiplegia, unspecified, affecting nondominant side  . Atrial fibrillation with RVR  . HTN (hypertension), malignant  . Hypokalemia  . Morbid obesity with body mass index of 40.0-44.9 in adult  . CVA (cerebral infarction)  . Hemiplegia affecting dominant side  . Noncompliance with medications   Past Medical History:  Past Medical History  Diagnosis Date  . Cardiomyopathy 11/28/2010  . Palpitation 11/28/2010  . Atrial fibrillation 10/29/2010  . Benign essential hypertension 12/15/2008  . Paroxysmal SVT (supraventricular tachycardia) 12/15/2008  . 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 Renal insufficiency 11/28/2010  . Overweight 08/23/2010  . Hematuria, unspecified 12/15/2008   Past Surgical History: No past surgical history on file.  Assessment / Plan / Recommendation Clinical Impression  Thomas [name] is a 32 y.o. LH-male with history of CM, atrial fibrillation, morbid obesity, who was admitted on 12/07/12 with left sided weakness and slurred speech. BP markedly elevated 212/132 and patient with history of non-compliance with antihypertensives and ASA for past 9 months. CT of head with indistinctness in right periopercular region and once BP stabilized, he was treated with tPA. Follow  up CCT with evolving cytotoxic edema suggesting early R-MCA infarct and no visible hemorrhage past tPA. Dr. Gala Romney consulted for input on atrial fibrillation and BP. He recommended resuming home meds and addition of Cardizem for rate control. Will need anticoagulation as well as outpatient sleep study. 2D echo done revealing EF 50-55% with dilated left atrium and severe concentric hypertrophy. Carotid dopplers without ICA stenosis. Neurology recommended Xarelto for CVA prophylaxis and to help with compliance. Patient continues with left sided weakness with decreased balance. Patient admitted to [facility] 12/09/12 and upon evaluation today presents with baseline, [facility] cognitive-linguistic abilities that will not impact his ability to meet PT/OT goals during CR stay.  As a result, no skilled SLP services are warranted at this time. Of note, during oral motor exam patient expressed concern for anterior loss of thin liquids; however, during skilled observation it did not occur.  SLP educated patient on potential for slight sensory changes and recommended use of straw as a compensatory strategy until it resolves.     SLP Assessment  Patient does not need any further Speech Lanaguage Pathology Services              Pain Pain Assessment Pain Assessment: No/denies pain Pain Score: 0-No pain Prior Functioning Cognitive/Linguistic Baseline: Within functional limits Type of Home: Apartment Lives With: Alone Available Help at Discharge: Family;Friend(s) Vocation: Other (comment) (Behavioral Health Tech)  See FIM for current functional status  Recommendations for other services: None  Discharge Criteria: Patient will be discharged from SLP if patient refuses treatment 3 consecutive times without medical reason, if treatment goals not met, if there is a change in medical status, if patient makes no progress towards goals or if patient is discharged from  hospital.  The above  assessment, treatment plan, treatment alternatives and goals were discussed and mutually agreed upon: by patient  Fae Pippin, M.A., CCC-SLP [phone]  Electra Paladino 12/10/2012, 5:03 PM

## 2012-12-10 NOTE — Progress Notes (Signed)
Occupational Therapy Session Note  Patient Details  Name: Thomas [name] MRN: [id number] Date of Birth: Feb 15, 1981  Today's Date: 12/10/2012 Time: 1105-1200 and 1400-1430 Time Calculation (min): 55 min and 30 min  Short Term Goals: Week 1:  OT Short Term Goal 1 (Week 1): STG = LTGs due to short ELOS  Skilled Therapeutic Interventions/Progress Updates:  Biomedical scientist reintegration;Discharge planning;DME/adaptive equipment instruction;Functional electrical stimulation;Functional mobility training;Neuromuscular re-education;Pain management;Patient/family education;Psychosocial support;Self Care/advanced ADL retraining;Therapeutic Activities;Therapeutic Exercise;UE/LE Strength taining/ROM;UE/LE Coordination activities   1) OT eval completed, ADL assessment conducted at walk-in shower level.  Completed bathing 60% in standing at shower level with use of tub bench intermittently for energy conservation and safety. Min assist ambulation, grooming conducted in standing. Mod assist overall with dressing secondary to dominant LUE weakness, inattention, and sensation.  Educated pt on retrograde massage for Lt hand swelling and propping hand on pillow or tray table instead of leaving it hanging down.  Pt with decreased attention to LUE and LLE with functional tasks.  2) 1:1 OT with focus on standing balance, grooming, and LUE forced use.  Engaged in grooming task in standing at sink with use of Lt hand as stabilizer with obtaining items and applying toothpaste.  When not in active use, engaged in weight bearing on sink through LUE to increase awareness and proprioception.  Hand over hand assist with holding toothbrush and opening toothpaste.  Engaged in reaching task in standing with focus on grasp and release in functional task.  Pt donned shoes with min assist to completely don Lt shoe with pt with decreased attention and ?sensation in Lt foot when shoe not donned entirely.  Therapy  Documentation Precautions:  Precautions Precautions: Fall Restrictions Weight Bearing Restrictions: No Pain: Pain Assessment Pain Assessment: No/denies pain Pain Score: 0-No pain ADL: ADL Grooming: Minimal assistance Where Assessed-Grooming: Standing at sink Upper Body Bathing: Minimal assistance Where Assessed-Upper Body Bathing: Shower Lower Body Bathing: Minimal assistance Where Assessed-Lower Body Bathing: Shower Upper Body Dressing: Moderate assistance Where Assessed-Upper Body Dressing: Chair Lower Body Dressing: Moderate assistance Where Assessed-Lower Body Dressing: Chair Toileting: Minimal assistance Where Assessed-Toileting: Teacher, adult education: Curator Method: Proofreader: Acupuncturist: Insurance underwriter Method: Designer, industrial/product: Transfer tub bench;Grab bars  See FIM for current functional status  Therapy/Group: Individual Therapy  Leonette Monarch 12/10/2012, 1:22 PM

## 2012-12-10 NOTE — Progress Notes (Signed)
Social Work Assessment and Plan Social Work Assessment and Plan  Patient Details  Name: Thomas [name] MRN: [id number] Date of Birth: [birth year]  Today's Date: 12/10/2012  Problem List:  Patient Active Problem List  Diagnosis  . Overweight  . HYPERTENSION, BENIGN ESSENTIAL, UNCONTROLLED  . PAROXYSMAL SUPRAVENTRICULAR TACHYCARDIA  . Atrial fibrillation  . VENTRICULAR HYPERTROPHY, LEFT  . HEMATURIA UNSPECIFIED  . CARDIOMYOPATHY  . RENAL INSUFFICIENCY  . PALPITATIONS  . Cerebral embolism with cerebral infarction  . Hemiplegia, unspecified, affecting nondominant side  . Atrial fibrillation with RVR  . HTN (hypertension), malignant  . Hypokalemia  . Morbid obesity with body mass index of 40.0-44.9 in adult  . CVA (cerebral infarction)  . Hemiplegia affecting dominant side  . Noncompliance with medications   Past Medical History:  Past Medical History  Diagnosis Date  . Cardiomyopathy 11/28/2010  . Palpitation 11/28/2010  . Atrial fibrillation 10/29/2010  . Benign essential hypertension 12/15/2008  . Paroxysmal SVT (supraventricular tachycardia) 12/15/2008  . 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 Renal insufficiency 11/28/2010  . Overweight 08/23/2010  . Hematuria, unspecified 12/15/2008   Past Surgical History: No past surgical history on file. Social History:  reports that he has never smoked. He does not have any smokeless tobacco history on file. He reports that he drinks alcohol. He reports that he does not use illicit drugs.  Family / Support Systems Marital Status: Single Patient Roles: Other (Comment) (Employee & Son) Other Supports: Jim Lundin  [phone]  [phone] Anticipated Caregiver: Mom plans to come to stay with pt or take pt back home with her at discharge. Ability/Limitations of Caregiver: Mother works from home, so  is felxible.  Discussing with pt staying here or Mom's home Caregiver Availability: 24/7 Family Dynamics: Pt reports they are close knit family, he has worked at [employer] since 2009 and likes it.  He hopes to retrun.  He expressed his family is spread out and main support is friends here.  Social History Preferred language: English Religion:  Cultural Background: No issues Education: High School Read: Yes Write: Yes Employment Status: Employed Name of Employer: [employer] Length of Employment: 2009  Return to Work Plans: Would like to return to work Fish farm manager Issues: No issues Guardian/Conservator: None-according to MD pt is capable of making his own decisions   Abuse/Neglect Physical Abuse: Denies Verbal Abuse: Denies Sexual Abuse: Denies Exploitation of patient/patient's resources: Denies Self-Neglect: Denies  Emotional Status Pt's affect, behavior adn adjustment status: Pt is motivated to improve and regain his independence.  He does not like relying upon others and asking for help.  He is surprised this has happened to him, he thought he was doing well.  He acknowledges he was not tkaing any meds and this was a bad mistake. Recent Psychosocial Issues: Other medical issues-needs PCP prior to discharge Pyschiatric History: No history-deferred depresison screen due to tired from therapies and wanted to defer.  Will monitor his coping and possibly refer him to nuero-psych to be evaluated, due to his young age and health issues. Substance Abuse History: Socially drinks, but does it feel it is an issue.  No other drugs  Patient / Family Perceptions, Expectations & Goals Pt/Family understanding of illness & functional limitations: Pt is able to explain his stroke and deficits.  He is encouraged by the progress he has made thus far and hopeful it will  continue.  He is still processing all of this and learnig about his health issues and how to control  them. Premorbid pt/family roles/activities: Son, Human resources officer, Friend, etc Anticipated changes in roles/activities/participation: resume Pt/family expectations/goals: Pt states: " I want to do for myself when I leave here, my Mom will help but I don't want her to have too."  Mom is on her way from [place], will see when she arrives.  Community Resources [employer]: None Premorbid Home Care/DME Agencies: None Transportation available at discharge: Mom and friends Resource referrals recommended: Support group (specify) ([facility] Support group)  Discharge Planning Living Arrangements: Alone Support Systems: Parent;Other relatives;Friends/neighbors Type of Residence: Private residence Insurance Resources: Media planner (specify) Horticulturist, commercial) Financial Resources: Employment Financial Screen Referred: Yes Living Expenses: Rent Money Management: Patient Do you have any problems obtaining your medications?: Yes (Describe) (Does not have a PCP and not tkaing emds PTA) Home Management: Self Patient/Family Preliminary Plans: Either return to his apartment or go to Mom's home in [place].  Depends upon how well he does stairs, he has many at his apartment.  Both discussing the plan and awaiting pt's progress. Social Work Anticipated Follow Up Needs: HH/OP;Support Group DC Planning Additional Notes/Comments: Need to set up with PCP before discharge  Clinical Impression Pleasant quiet gentleman who is motivated and wants to do well here.  Mom is on her way from [place] to assist with his care at discharge. Unsure if plan to return to his apartment or go to Mom's home. Work on a safe discharge plan, may benefit form Neuro-psych evaluation.  Lucy Chris 12/10/2012, 10:32 AM

## 2012-12-11 ENCOUNTER — Inpatient Hospital Stay (HOSPITAL_COMMUNITY): Payer: 59 | Admitting: Occupational Therapy

## 2012-12-11 ENCOUNTER — Inpatient Hospital Stay (HOSPITAL_COMMUNITY): Payer: 59 | Admitting: *Deleted

## 2012-12-11 ENCOUNTER — Inpatient Hospital Stay (HOSPITAL_COMMUNITY): Payer: 59

## 2012-12-11 NOTE — Progress Notes (Signed)
Physical Therapy Session Note  Patient Details  Name: Thomas [name] MRN: [id number] Date of Birth: 1981/02/15  Today's Date: 12/11/2012 Time: 14:06-15:00 ( )   Short Term Goals: Week 1:  PT Short Term Goal 1 (Week 1): STGs=LTGs secondary to short LOS  Skilled Therapeutic Interventions/Progress Updates:  Pt participated in group tx for gait training, balance, and therex for strengthening.  Gait training in controlled environment with min-guard/S assist in busy setting 4x140' with no device.   NMR with dynamic balance training including each of the following 2x25' with min-guard A:  - retro walking, side-stepping, grapevines, high-knee marching, tandem walking, gait with lateral and up/down head turns, and carrying objects. Pt has most difficulty with tandem walking, tending to have LOB to L, but able to correct with good reactions. Pt needed 3 seated rest breaks throughout these tasks.   Pt ascended/descended 10 stairs with 1 rail and close S, needing cues for sequence.   Seated and standing therex including: AP, LAQ, marching, heel slides bil x20 Standing marching, hip ABD, heel raises and squats bil 2x10  Supine knee to chest due to report of tight L hip "socket."      Therapy Documentation Precautions:  Precautions Precautions: Fall Restrictions Weight Bearing Restrictions: No    Pain: No complaints    See FIM for current functional status  Therapy/Group: Group Therapy  Clydene Laming, PT, DPT   Eulogio Ditch, Kerina Simoneau M 12/11/2012, 2:32 PM

## 2012-12-11 NOTE — Progress Notes (Signed)
Patient ID: Thomas [name], male   DOB: [birth year], 32 y.o.   MRN: [id number] Thomas [name] is an 32 y.o. male with a history of hypertension and atrial fibrillation who was at home and went to the bathroom with no complaints. While in the bathroom developed slurred speech and left sided weakness. EMS was called at that time and the patient was brought in 12/07/2012 as a code stroke. While in the CT symptoms improved somewhat but patient did not return to baseline. BP was markedly elevated. Patient reported being noncompliant with his antihypertensive and ASA for 9 months. He received IV TPA.   Subjective/Complaints: Slept well No dizziness Review of Systems  Constitutional: Negative.   HENT: Positive for congestion.   Eyes: Negative.   Respiratory: Positive for cough.   Cardiovascular: Negative.   Gastrointestinal: Negative.   Genitourinary: Negative.   Musculoskeletal: Negative.   Skin: Negative.   Neurological: Positive for sensory change and focal weakness.  Endo/Heme/Allergies: Negative.   Psychiatric/Behavioral: Negative.    Objective: Vital Signs: Blood pressure 140/98, pulse 64, temperature 97.9 F (36.6 C), temperature source Oral, resp. rate 20, height 6\' 1"  (1.854 m), weight 147.328 kg (324 lb 12.8 oz), SpO2 98.00%. No results found. Results for orders placed during the hospital encounter of 12/09/12 (from the past 72 hour(s))  CBC WITH DIFFERENTIAL     Status: Normal   Collection Time   12/10/12  5:55 AM      Component Value Range Comment   WBC 6.9  4.0 - 10.5 K/uL    RBC 5.01  4.22 - 5.81 MIL/uL    Hemoglobin 14.5  13.0 - 17.0 g/dL    HCT 40.9  81.1 - 91.4 %    MCV 85.6  78.0 - 100.0 fL    MCH 28.9  26.0 - 34.0 pg    MCHC 33.8  30.0 - 36.0 g/dL    RDW 78.2  95.6 - 21.3 %    Platelets 172  150 - 400 K/uL    Neutrophils Relative 54  43 - 77 %    Neutro Abs 3.7  1.7 - 7.7 K/uL    Lymphocytes Relative 36  12 - 46 %    Lymphs Abs 2.5  0.7 - 4.0 K/uL    Monocytes Relative 8   3 - 12 %    Monocytes Absolute 0.6  0.1 - 1.0 K/uL    Eosinophils Relative 1  0 - 5 %    Eosinophils Absolute 0.1  0.0 - 0.7 K/uL    Basophils Relative 0  0 - 1 %    Basophils Absolute 0.0  0.0 - 0.1 K/uL   COMPREHENSIVE METABOLIC PANEL     Status: Abnormal   Collection Time   12/10/12  5:55 AM      Component Value Range Comment   Sodium 138  135 - 145 mEq/L    Potassium 3.9  3.5 - 5.1 mEq/L    Chloride 101  96 - 112 mEq/L    CO2 21  19 - 32 mEq/L    Glucose, Bld 91  70 - 99 mg/dL    BUN 20  6 - 23 mg/dL    Creatinine, Ser 0.86 (*) 0.50 - 1.35 mg/dL    Calcium 9.6  8.4 - 57.8 mg/dL    Total Protein 7.4  6.0 - 8.3 g/dL    Albumin 3.5  3.5 - 5.2 g/dL    AST 28  0 - 37 U/L    ALT 26  0 - 53 U/L    Alkaline Phosphatase 43  39 - 117 U/L    Total Bilirubin 0.7  0.3 - 1.2 mg/dL    GFR calc non Af Amer 56 (*) >90 mL/min    GFR calc Af Amer 65 (*) >90 mL/min      HEENT: normal. Physical Exam  Constitutional: He is oriented to person, place, and time. He appears well-developed and well-nourished.  HENT:  Head: Normocephalic and atraumatic.  Right Ear: External ear normal.  Left Ear: External ear normal.  Nose: Nose normal.  Eyes: Conjunctivae normal and EOM are normal. Pupils are equal, round, and reactive to light.  Neck: Normal range of motion. Neck supple.  Cardiovascular: Exam reveals no gallop.   No murmur heard. Pulmonary/Chest: Effort normal and breath sounds normal.  Abdominal: Soft. Bowel sounds are normal. He exhibits no distension.  Musculoskeletal: He exhibits edema. He exhibits no tenderness.  Neurological: He is alert and oriented to person, place, and time. He has normal reflexes.  Skin: Skin is warm and dry.  Psychiatric: He has a normal mood and affect. His behavior is normal. Judgment and thought content normal.  3/5 Left deltoid biceps, triceps and 2/5 grip, 3/5 L HF, KE and ankle DF/PF  Assessment/Plan: 1. Functional deficits secondary to R MCA distribution  infarct which require 3+ hours per day of interdisciplinary therapy in a comprehensive inpatient rehab setting. Physiatrist is providing close team supervision and 24 hour management of active medical problems listed below. Physiatrist and rehab team continue to assess barriers to discharge/monitor patient progress toward functional and medical goals. FIM: FIM - Bathing Bathing Steps Patient Completed: Chest;Right Arm;Left Arm;Abdomen;Front perineal area;Buttocks;Right upper leg;Left upper leg;Right lower leg (including foot) Bathing: 4: Min-Patient completes 8-9 28f 10 parts or 75+ percent  FIM - Upper Body Dressing/Undressing Upper body dressing/undressing steps patient completed: Thread/unthread right sleeve of pullover shirt/dresss;Thread/unthread left sleeve of pullover shirt/dress Upper body dressing/undressing: 3: Mod-Patient completed 50-74% of tasks FIM - Lower Body Dressing/Undressing Lower body dressing/undressing steps patient completed: Thread/unthread right underwear leg;Thread/unthread left pants leg;Thread/unthread right pants leg;Don/Doff right shoe Lower body dressing/undressing: 3: Mod-Patient completed 50-74% of tasks  FIM - Toileting Toileting steps completed by patient: Adjust clothing prior to toileting;Performs perineal hygiene Toileting: 3: Mod-Patient completed 2 of 3 steps  FIM - Diplomatic Services operational officer Devices: Grab bars Toilet Transfers: 4-To toilet/BSC: Min A (steadying Pt. > 75%);4-From toilet/BSC: Min A (steadying Pt. > 75%)  FIM - Bed/Chair Transfer Bed/Chair Transfer Assistive Devices: Arm rests Bed/Chair Transfer: 5: Supine > Sit: Supervision (verbal cues/safety issues);5: Sit > Supine: Supervision (verbal cues/safety issues);4: Bed > Chair or W/C: Min A (steadying Pt. > 75%);4: Chair or W/C > Bed: Min A (steadying Pt. > 75%)  FIM - Locomotion: Wheelchair Distance: 0 Locomotion: Wheelchair: 0: Activity did not occur FIM - Locomotion:  Ambulation Ambulation/Gait Assistance: 4: Min assist Locomotion: Ambulation: 2: Travels 50 - 149 ft with minimal assistance (Pt.>75%)  Comprehension Comprehension Mode: Auditory Comprehension: 7-Follows complex conversation/direction: With no assist  Expression Expression Mode: Verbal Expression: 7-Expresses complex ideas: With no assist  Social Interaction Social Interaction: 7-Interacts appropriately with others - No medications needed.  Problem Solving Problem Solving: 7-Solves complex problems: Recognizes & self-corrects  Memory Memory: 7-Complete Independence: No helper  2. Anticoagulation/DVT prophylaxis with Pharmaceutical: Xarelto 3. Pain Management: tylenol  Medical Problem List and Plan:  1. DVT Prophylaxis/Anticoagulation: Pharmaceutical:Xarelto, also for secondary stroke prevention  2. Pain Management: N/A  3. Mood: Seems  to have a good outlook.  4. Neuropsych: This patient is capable of making decisions on his/her own behalf.  5 HTN: Monitor with bid checks. Continue coreg, Cardizem, Avapro and HCTZ. Continue K+ supplement as patient hypokalemic and is on diuretic. Recheck labs in am.  6. Atrial Fibrillation: monitor HR with bid check. Continue coreg and cardizem for HR control. Continue xarelto.  7. Morbid obesity: Will have dietician educate on heart healthy diet.  8. ?CKD: baseline Cr was 1.35 in 9/11 and 1.6 at admission. Will monitor along as now with ace and diuretic on board. Recheck labs in am  LOS (Days) 2 A FACE TO FACE EVALUATION WAS PERFORMED  Aby Gessel E 12/11/2012, 7:13 AM

## 2012-12-11 NOTE — Progress Notes (Signed)
Physical Therapy Session Note  Patient Details  Name: Thomas [name] MRN: [id number] Date of Birth: [birth year]  Today's Date: 12/11/2012 Time: 9:35-10:30 ( )  Short Term Goals: Week 1:  PT Short Term Goal 1 (Week 1): STGs=LTGs secondary to short LOS  Skilled Therapeutic Interventions/Progress Updates:  Tx focused on gait training, NMR for LLE/UE activation and coordination, and therex for activity tolerance.  Pt up in chair upon arrival, somewhat frustrated with inability to use dominant UE grip. Educated pt on importance of and techniques for incorporating LUE in all mobility tasks throughout the day.Pt navigated in room environment with no device to gather clothing and socks with Min A for steadying.   Gait training in controlled environment and on carpet 3x140' with min-guard A and no device. Pt with increased lateral sway and guarded gait pattern.   NMR with following tasks, min-guard for safety and verbal cues for technique and tactile cues for facilitation of L hip:  - step taps to 6' step with no UE support R/L x20 each with focus on control and speed as able - forward and lateral step-ups with LLE lifting/lowering x15 each with R UE support - step-to numbered discs with cogniitve task of addition in 2-3 step sequences with bil LE in all directions   Nustep x100min with bil UE (grip on LUE) and LEs, level 5x61min, level 6x75min. Pt needed 2 rest breaks due to fatigue.       Therapy Documentation Precautions:  Precautions Precautions: Fall Restrictions Weight Bearing Restrictions: No    Pain: None     Locomotion : Ambulation Ambulation/Gait Assistance: 4: Min assist   See FIM for current functional status  Therapy/Group: Individual Therapy  Clydene Laming, PT, DPT   12/11/2012, 10:01 AM

## 2012-12-11 NOTE — Progress Notes (Signed)
Occupational Therapy Session Note  Patient Details  Name: Thomas [name] MRN: [id number] Date of Birth: 1981/11/18  Today's Date: 12/11/2012 Time: 1105-1205 Time Calculation (min): 60 min  Short Term Goals: Week 1:  OT Short Term Goal 1 (Week 1): STG = LTGs due to short ELOS  Skilled Therapeutic Interventions/Progress Updates:  Pt seen for ADL retraining at walk-in shower level with overall supervision.  Pt alternated between standing and use of tub bench for safety and energy conservation with bathing.  Pt attempted to don pants in standing, however realized fall risk secondary to decreased balance and safely sat on chair to don pants.  Pt with carryover of hemi-technique educated in prior session and increase independence with donning shirt and pants.  Provided pt with moderate resistance therapy putty for LUE strengthening and FMC.  Pt return demonstrated [facility] activities, educated on modifying activities if too difficult.    Therapy Documentation Precautions:  Precautions Precautions: Fall Restrictions Weight Bearing Restrictions: No General:   Vital Signs: Therapy Vitals Pulse Rate: 74  BP: 151/99 mmHg (during exercise) Patient Position, if appropriate: Sitting Oxygen Therapy SpO2: 94 % O2 Device: None (Room air) Pain: Pain Assessment Pain Assessment: No/denies pain  See FIM for current functional status  Therapy/Group: Individual Therapy  Leonette Monarch 12/11/2012, 1:00 PM

## 2012-12-11 NOTE — Progress Notes (Addendum)
Occupational Therapy Session Note  Patient Details  Name: Thomas [name] MRN: [id number] Date of Birth: 03-16-1981  Today's Date: 12/11/2012 Time: 7829-5621 Time Calculation (min): 30 min  Short Term Goals: Week 1:  OT Short Term Goal 1 (Week 1): STG = LTGs due to short ELOS  Skilled Therapeutic Interventions/Progress Updates:   Pt supine in bed upon arrival. Nursing came in to give meds once pt sitting EOB. Pt used bathroom first with distant supervision, then stood at sink to complete hand washing and oral care. Placed L hand on edge of sink for weight bearing. Pt able to stabilize toothpaste in L hand with set up to place top on. With pt in sitting performed AROM (with therapist holding end range stretch) including: sh flexion, ABD, elbow flexion/extension, wrist flexion/extension x 10 each. Wrist flexion/extension performed with min resistance also x 10 each.   Therapy Documentation Precautions:  Precautions Precautions: Fall Restrictions Weight Bearing Restrictions: No General:   Vital Signs:   Pain: No pain reported.   ADL: ADL Grooming: Minimal assistance Where Assessed-Grooming: Standing at sink Upper Body Bathing: Minimal assistance Where Assessed-Upper Body Bathing: Shower Lower Body Bathing: Minimal assistance Where Assessed-Lower Body Bathing: Shower Upper Body Dressing: Moderate assistance Where Assessed-Upper Body Dressing: Chair Lower Body Dressing: Moderate assistance Where Assessed-Lower Body Dressing: Chair Toileting: Minimal assistance Where Assessed-Toileting: Teacher, adult education: Curator Method: Proofreader: Acupuncturist: Insurance underwriter Method: Designer, industrial/product: Transfer tub bench;Grab bars Exercises:   Other Treatments:    See FIM for current functional status  Therapy/Group: Individual Therapy  [name] [name] 12/11/2012,  10:01 AM

## 2012-12-12 ENCOUNTER — Inpatient Hospital Stay (HOSPITAL_COMMUNITY): Payer: 59 | Admitting: *Deleted

## 2012-12-12 ENCOUNTER — Inpatient Hospital Stay (HOSPITAL_COMMUNITY): Payer: 59 | Admitting: Physical Therapy

## 2012-12-12 ENCOUNTER — Inpatient Hospital Stay (HOSPITAL_COMMUNITY): Payer: 59 | Admitting: Speech Pathology

## 2012-12-12 NOTE — Progress Notes (Signed)
Physical Therapy Note  Patient Details  Name: Teoman Giraud MRN: [id number] Date of Birth: 04/13/1981 Today's Date: 12/12/2012  Time:  1030-1115  ( )  1st session     Pain:  None        Individual session Time:  1415-1500  (45 min)  2nd session.  Pain:  None       Individual session    1ST SESSION:Engaged in LUE therapeutice activities with emphasis on wrist, finger extension.  Pt. Cued to look at his hand as he   moves it on the mat and up in forward flexion.  Gave pt instructions to practice gesturing stop with his hand and patting on the mat.    2ND SESSION:  Engaged in LUE neuromuscular reeducation with forearm, wrist, and shoulder mobilization.  Engaged in weight bearing on the mat with arms in external rotation, and extension.  Stretched out the forearm and holding a tray with the hand.  Pt. Needed manual facilitation to get PIP joints straight.  Asked him to practice holding LUE against wall 3 x a day for 10 seconds.      Humberto Seals 12/12/2012, 11:19 AM

## 2012-12-12 NOTE — Progress Notes (Signed)
Patient ID: Thomas [name], male   DOB: 23-Sep-1981, 32 y.o.   MRN: [id number] Thomas [name] is an 32 y.o. male with a history of hypertension and atrial fibrillation who was at home and went to the bathroom with no complaints. While in the bathroom developed slurred speech and left sided weakness. EMS was called at that time and the patient was brought in 12/07/2012 as a code stroke. While in the CT symptoms improved somewhat but patient did not return to baseline. BP was markedly elevated. Patient reported being noncompliant with his antihypertensive and ASA for 9 months. He received IV TPA.   Subjective/Complaints: Felt confused and weak all over yesterday eveniong after therapy . Feels ok today Review of Systems  Constitutional: Negative.   HENT: Positive for congestion.   Eyes: Negative.   Respiratory: Positive for cough.   Cardiovascular: Negative.   Gastrointestinal: Negative.   Genitourinary: Negative.   Musculoskeletal: Negative.   Skin: Negative.   Neurological: Positive for sensory change and focal weakness.  Endo/Heme/Allergies: Negative.   Psychiatric/Behavioral: Negative.    Objective: Vital Signs: Blood pressure 167/110, pulse 64, temperature 99.3 F (37.4 C), temperature source Oral, resp. rate 17, height 6\' 1"  (1.854 m), weight 147.328 kg (324 lb 12.8 oz), SpO2 98.00%. No results found. Results for orders placed during the hospital encounter of 12/09/12 (from the past 72 hour(s))  CBC WITH DIFFERENTIAL     Status: Normal   Collection Time   12/10/12  5:55 AM      Component Value Range Comment   WBC 6.9  4.0 - 10.5 K/uL    RBC 5.01  4.22 - 5.81 MIL/uL    Hemoglobin 14.5  13.0 - 17.0 g/dL    HCT 11.9  14.7 - 82.9 %    MCV 85.6  78.0 - 100.0 fL    MCH 28.9  26.0 - 34.0 pg    MCHC 33.8  30.0 - 36.0 g/dL    RDW 56.2  13.0 - 86.5 %    Platelets 172  150 - 400 K/uL    Neutrophils Relative 54  43 - 77 %    Neutro Abs 3.7  1.7 - 7.7 K/uL    Lymphocytes Relative 36  12 - 46 %      Lymphs Abs 2.5  0.7 - 4.0 K/uL    Monocytes Relative 8  3 - 12 %    Monocytes Absolute 0.6  0.1 - 1.0 K/uL    Eosinophils Relative 1  0 - 5 %    Eosinophils Absolute 0.1  0.0 - 0.7 K/uL    Basophils Relative 0  0 - 1 %    Basophils Absolute 0.0  0.0 - 0.1 K/uL   COMPREHENSIVE METABOLIC PANEL     Status: Abnormal   Collection Time   12/10/12  5:55 AM      Component Value Range Comment   Sodium 138  135 - 145 mEq/L    Potassium 3.9  3.5 - 5.1 mEq/L    Chloride 101  96 - 112 mEq/L    CO2 21  19 - 32 mEq/L    Glucose, Bld 91  70 - 99 mg/dL    BUN 20  6 - 23 mg/dL    Creatinine, Ser 7.84 (*) 0.50 - 1.35 mg/dL    Calcium 9.6  8.4 - 69.6 mg/dL    Total Protein 7.4  6.0 - 8.3 g/dL    Albumin 3.5  3.5 - 5.2 g/dL    AST  28  0 - 37 U/L    ALT 26  0 - 53 U/L    Alkaline Phosphatase 43  39 - 117 U/L    Total Bilirubin 0.7  0.3 - 1.2 mg/dL    GFR calc non Af Amer 56 (*) >90 mL/min    GFR calc Af Amer 65 (*) >90 mL/min      HEENT: normal. Physical Exam  Constitutional: He is oriented to person, place, and time. He appears well-developed and well-nourished.  HENT:  Head: Normocephalic and atraumatic.  Right Ear: External ear normal.  Left Ear: External ear normal.  Nose: Nose normal.  Eyes: Conjunctivae normal and EOM are normal. Pupils are equal, round, and reactive to light.  Neck: Normal range of motion. Neck supple.  Cardiovascular: Exam reveals no gallop.   No murmur heard. Pulmonary/Chest: Effort normal and breath sounds normal.  Abdominal: Soft. Bowel sounds are normal. He exhibits no distension.  Musculoskeletal: He exhibits edema. He exhibits no tenderness.  Neurological: He is alert and oriented to person, place, and time. He has normal reflexes.  Skin: Skin is warm and dry.  Psychiatric: He has a normal mood and affect. His behavior is normal. Judgment and thought content normal.  3/5 Left deltoid biceps, triceps and 3/5 grip, 3/5 L HF, KE and ankle DF/PF 5/5 on R  side Assessment/Plan: 1. 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 Physiatrist is providing close team supervision and 24 hour management of active medical problems listed below. Physiatrist and rehab team continue to assess barriers to discharge/monitor patient progress toward functional and medical goals. FIM: FIM - Bathing Bathing Steps Patient Completed: Chest;Right Arm;Left Arm;Abdomen;Front perineal area;Buttocks;Right upper leg;Left upper leg;Right lower leg (including foot) Bathing: 4: Min-Patient completes 8-9 37f 10 parts or 75+ percent  FIM - Upper Body Dressing/Undressing Upper body dressing/undressing steps patient completed: Thread/unthread right sleeve of pullover shirt/dresss;Thread/unthread left sleeve of pullover shirt/dress;Put head through opening of pull over shirt/dress;Pull shirt over trunk Upper body dressing/undressing: 5: Set-up assist to: Obtain clothing/put away FIM - Lower Body Dressing/Undressing Lower body dressing/undressing steps patient completed: Thread/unthread right underwear leg;Thread/unthread left underwear leg;Pull underwear up/down;Thread/unthread right pants leg;Thread/unthread left pants leg;Pull pants up/down;Don/Doff left sock;Don/Doff right sock;Don/Doff right shoe;Don/Doff left shoe Lower body dressing/undressing: 5: Set-up assist to: Obtain clothing  FIM - Toileting Toileting steps completed by patient: Adjust clothing prior to toileting;Performs perineal hygiene;Adjust clothing after toileting Toileting: 7: Independent: No helper, no device  FIM - Diplomatic Services operational officer Devices: Grab bars Toilet Transfers: 5-To toilet/BSC: Supervision (verbal cues/safety issues);5-From toilet/BSC: Supervision (verbal cues/safety issues)  FIM - Press photographer Assistive Devices: Arm  rests Bed/Chair Transfer: 4: Bed > Chair or W/C: Min A (steadying Pt. > 75%);5: Chair or W/C > Bed: Supervision (verbal cues/safety issues)  FIM - Locomotion: Wheelchair Distance: 0 Locomotion: Wheelchair: 0: Activity did not occur FIM - Locomotion: Ambulation Ambulation/Gait Assistance: 4: Min guard Locomotion: Ambulation: 2: Travels 50 - 149 ft with minimal assistance (Pt.>75%)  Comprehension Comprehension Mode: Auditory Comprehension: 5-Understands complex 90% of the time/Cues < 10% of the time  Expression Expression Mode: Verbal Expression: 5-Expresses basic needs/ideas: With no assist  Social Interaction Social Interaction: 6-Interacts appropriately with others with medication or extra time (anti-anxiety, antidepressant).  Problem Solving Problem Solving: 7-Solves complex problems: Recognizes & self-corrects  Memory Memory: 7-Complete Independence: No helper  2. Anticoagulation/DVT prophylaxis with Pharmaceutical: Xarelto  3. Pain Management: tylenol  Medical Problem List and Plan:  1. DVT Prophylaxis/Anticoagulation: Pharmaceutical:Xarelto, also for secondary stroke prevention  2. Pain Management: N/A  3. Mood: Seems to have a good outlook.  4. Neuropsych: This patient is capable of making decisions on his/her own behalf.  5 HTN: Monitor with bid checks. Continue coreg, Cardizem, Avapro and HCTZ. Continue K+ supplement as patient hypokalemic and is on diuretic. Recheck labs in am.  6. Atrial Fibrillation: monitor HR with bid check. Continue coreg and cardizem for HR control. Continue xarelto.  7. Morbid obesity: Will have dietician educate on heart healthy diet.  8. ?CKD: baseline Cr was 1.35 in 9/11 and 1.6 at admission. Will monitor along as now with ace and diuretic on board.  LOS (Days) 3 A FACE TO FACE EVALUATION WAS PERFORMED  KIRSTEINS,ANDREW E 12/12/2012, 8:15 AM

## 2012-12-12 NOTE — Progress Notes (Signed)
Physical Therapy Session Note  Patient Details  Name: Thomas [name] MRN: [id number] Date of Birth: 26-Jan-1981  Today's Date: 12/12/2012 Time: 1530-1600 Time Calculation (min): 30 min  Short Term Goals: Week 1:  PT Short Term Goal 1 (Week 1): STGs=LTGs secondary to short LOS   Therapy Documentation Precautions:  Precautions Precautions: Fall Restrictions Weight Bearing Restrictions: No Pain: no c/o pain  Therapeutic Activity:(15')  Transfer Training and dynamic functional balance activities with S/Independent assist including picking objects off of floor and mobility around bedroom/obstacles. Therapeutic Exercise:(15') Review of L hand/wrist and finger stretching into AAROM extension with supination as well as AROM into flexion.  B LE exercises in sitting.   Therapy/Group: Individual Therapy  Rex Kras 12/12/2012, 3:40 PM

## 2012-12-12 NOTE — Progress Notes (Signed)
Physical Therapy Note  Patient Details  Name: Thomas [name] MRN: [id number] Date of Birth: August 13, 1981 Today's Date: 12/12/2012  1300-1355 (55 minutes) group Pain: no reported pain Pt participated in PT group session focused on gait training/safety/endurance. Pt performed 3 X 25 reps using Kinetron in standing without UE support to challenge standing balance; gait- 150 feet SBA without AD.    Zaara Sprowl,JIM 12/12/2012, 7:53 AM

## 2012-12-13 ENCOUNTER — Inpatient Hospital Stay (HOSPITAL_COMMUNITY): Payer: 59 | Admitting: Physical Therapy

## 2012-12-13 MED ORDER — WHITE PETROLATUM GEL
Status: AC
  Filled 2012-12-13: qty 5

## 2012-12-13 NOTE — Progress Notes (Signed)
Patient ID: Thomas [name], male   DOB: 03-13-1981, 32 y.o.   MRN: [id number] Thomas [name] is an 32 y.o. male with a history of hypertension and atrial fibrillation who was at home and went to the bathroom with no complaints. While in the bathroom developed slurred speech and left sided weakness. EMS was called at that time and the patient was brought in 12/07/2012 as a code stroke. While in the CT symptoms improved somewhat but patient did not return to baseline. BP was markedly elevated. Patient reported being noncompliant with his antihypertensive and ASA for 9 months. He received IV TPA.   Subjective/Complaints: Cough, clear sputum no SOB or CP no sweats or chills Feels ok today Review of Systems  Constitutional: Negative.   HENT: Positive for congestion.   Eyes: Negative.   Respiratory: Positive for cough.   Cardiovascular: Negative.   Gastrointestinal: Negative.   Genitourinary: Negative.   Musculoskeletal: Negative.   Skin: Negative.   Neurological: Positive for sensory change and focal weakness.  Endo/Heme/Allergies: Negative.   Psychiatric/Behavioral: Negative.    Objective: Vital Signs: Blood pressure 146/84, pulse 69, temperature 98.3 F (36.8 C), temperature source Oral, resp. rate 20, height 6\' 1"  (1.854 m), weight 147.328 kg (324 lb 12.8 oz), SpO2 96.00%. No results found. No results found for this or any previous visit (from the past 72 hour(s)).   HEENT: normal. Physical Exam  Constitutional: He is oriented to person, place, and time. He appears well-developed and well-nourished.  HENT:  Head: Normocephalic and atraumatic.  Right Ear: External ear normal.  Left Ear: External ear normal.  Nose: Nose normal.  Eyes: Conjunctivae normal and EOM are normal. Pupils are equal, round, and reactive to light.  Neck: Normal range of motion. Neck supple.  Cardiovascular: An irregularly irregular rhythm present. Exam reveals no gallop.   No murmur heard. Pulmonary/Chest: Effort  normal and breath sounds normal.  Abdominal: Soft. Bowel sounds are normal. He exhibits no distension.  Musculoskeletal: He exhibits edema. He exhibits no tenderness.  Neurological: He is alert and oriented to person, place, and time. He has normal reflexes.  Skin: Skin is warm and dry.  Psychiatric: He has a normal mood and affect. His behavior is normal. Judgment and thought content normal.  3/5 Left deltoid biceps, triceps and 3/5 grip, 3/5 L HF, KE and ankle DF/PF 5/5 on R side Assessment/Plan: 1. 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 Physiatrist is providing close team supervision and 24 hour management of active medical problems listed below. Physiatrist and rehab team continue to assess barriers to discharge/monitor patient progress toward functional and medical goals. FIM: FIM - Bathing Bathing Steps Patient Completed: Front perineal area;Buttocks Bathing: 3: Mod-Patient completes 5-7 68f 10 parts or 50-74%  FIM - Upper Body Dressing/Undressing Upper body dressing/undressing steps patient completed: Thread/unthread right sleeve of pullover shirt/dresss;Thread/unthread left sleeve of pullover shirt/dress;Put head through opening of pull over shirt/dress;Pull shirt over trunk Upper body dressing/undressing: 1: Total-Patient completed less than 25% of tasks FIM - Lower Body Dressing/Undressing Lower body dressing/undressing steps patient completed: Don/Doff right shoe Lower body dressing/undressing: 1: Total-Patient completed less than 25% of tasks  FIM - Toileting Toileting steps completed by patient: Adjust clothing prior to toileting;Performs perineal hygiene;Adjust clothing after toileting Toileting: 4: Steadying assist  FIM - Diplomatic Services operational officer Devices: Grab bars Toilet Transfers: 4-To toilet/BSC: Min A (steadying Pt. >  75%)  FIM - Banker Devices: Bed rails Bed/Chair Transfer: 5: Supine > Sit: Supervision (verbal cues/safety issues)  FIM - Locomotion: Wheelchair Distance: 0 Locomotion: Wheelchair: 0: Activity did not occur FIM - Locomotion: Ambulation Ambulation/Gait Assistance: 4: Min guard Locomotion: Ambulation: 2: Travels 50 - 149 ft with moderate assistance (Pt: 50 - 74%)  Comprehension Comprehension Mode: Auditory Comprehension: 5-Understands complex 90% of the time/Cues < 10% of the time  Expression Expression Mode: Verbal Expression: 5-Expresses basic needs/ideas: With no assist  Social Interaction Social Interaction: 6-Interacts appropriately with others with medication or extra time (anti-anxiety, antidepressant).  Problem Solving Problem Solving: 5-Solves basic 90% of the time/requires cueing < 10% of the time  Memory Memory: 5-Recognizes or recalls 90% of the time/requires cueing < 10% of the time  2. Anticoagulation/DVT prophylaxis with Pharmaceutical: Xarelto 3. Pain Management: tylenol  Medical Problem List and Plan:  1. DVT Prophylaxis/Anticoagulation: Pharmaceutical:Xarelto, also for secondary stroke prevention  2. Pain Management: N/A  3. Mood: Seems to have a good outlook.  4. Neuropsych: This patient is capable of making decisions on his/her own behalf.  5 HTN: Monitor with bid checks. Continue coreg, Cardizem, Avapro and HCTZ. Continue K+ supplement as patient hypokalemic and is on diuretic. Recheck labs in am.  6. Atrial Fibrillation: monitor HR with bid check. Continue coreg and cardizem for HR control. Continue xarelto.  7. Morbid obesity: Will have dietician educate on heart healthy diet.  8. ?CKD: baseline Cr was 1.35 in 9/11 and 1.6 at admission. Will monitor along as now with ace and diuretic on board.  LOS (Days) 4 A FACE TO FACE EVALUATION WAS PERFORMED  Thomas [name] E 12/13/2012, 8:00 AM

## 2012-12-13 NOTE — Progress Notes (Signed)
Dr. Wynn Banker notified of patient's BP taken by NT earlier was 103/54 and now was 171/108; was unable to hear BP manually.  No new orders received.  MD states he will check prior BP's before having patient get Clonidine.

## 2012-12-13 NOTE — Progress Notes (Signed)
Physical Therapy Note  Patient Details  Name: Thomas [name] MRN: [id number] Date of Birth: [birth year] Today's Date: 12/13/2012  1330-1425 (55 minutes) group Pain: no complaint of pain Pt participated in PT group session focused on gait training/safety/endurance/ LE strengthening. Pt ambulates on unit > 200 feet SBA in controlled environment. Nustep Level 5 X 15 minutes for general LE strengthening/ LT UE/LE control ; up/down 12 steps X 2 with one rail SBA step over step up/step to step down.; kicking soccer ball SBA with no balance issues.     Zoraya Fiorenza,JIM 12/13/2012, 7:36 AM

## 2012-12-14 ENCOUNTER — Encounter (HOSPITAL_COMMUNITY): Payer: 59

## 2012-12-14 ENCOUNTER — Inpatient Hospital Stay (HOSPITAL_COMMUNITY): Payer: 59 | Admitting: Physical Therapy

## 2012-12-14 ENCOUNTER — Inpatient Hospital Stay (HOSPITAL_COMMUNITY): Payer: 59 | Admitting: Occupational Therapy

## 2012-12-14 DIAGNOSIS — I6340 Cerebral infarction due to embolism of unspecified cerebral artery: Secondary | ICD-10-CM

## 2012-12-14 DIAGNOSIS — G8110 Spastic hemiplegia affecting unspecified side: Secondary | ICD-10-CM

## 2012-12-14 MED ORDER — DOXAZOSIN MESYLATE 1 MG PO TABS
1.0000 mg | ORAL_TABLET | Freq: Every day | ORAL | Status: DC
  Administered 2012-12-14 – 2012-12-18 (×5): 1 mg via ORAL
  Filled 2012-12-14 (×6): qty 1

## 2012-12-14 NOTE — Progress Notes (Signed)
Occupational Therapy Session Note  Patient Details  Name: Thomas [name] MRN: [id number] Date of Birth: 1981/10/25  Today's Date: 12/14/2012 Time: 1100-1130 Time Calculation (min): 30 min  Short Term Goals: Week 1:  OT Short Term Goal 1 (Week 1): STG = LTGs due to short ELOS  Skilled Therapeutic Interventions/Progress Updates:    Pt seen to address LUE motor control and hand coordination with a variety of activities.  Pt worked on wall slides by pushing washcloth up/down wall.  Pt was able to achieve full rom in shoulder with support from wall but frequently lost his grip on the washcloth. Pt has limited controlled finger extension, worked on towel scrunches on table. Encouraged pt to complete these in his room. Also worked on shoulder control and hand grip by rotating hula hoop and lifting overhead. He has quite a bit of motor impersistence in left hand. He worked on bimanual task of placing pillowcase on pillow with great difficulty but was eventually able to do it.    Therapy Documentation Precautions:  Precautions Precautions: Fall Restrictions Weight Bearing Restrictions: No     Pain: Pain Assessment Pain Assessment: No/denies pain  Therapy/Group: Individual Therapy  SAGUIER,JULIA 12/14/2012, 12:08 PM

## 2012-12-14 NOTE — Progress Notes (Signed)
Patient ID: Thomas [name], male   DOB: [birth year], 32 y.o.   MRN: [id number] Thomas [name] is an 32 y.o. male with a history of hypertension and atrial fibrillation who was at home and went to the bathroom with no complaints. While in the bathroom developed slurred speech and left sided weakness. EMS was called at that time and the patient was brought in 12/07/2012 as a code stroke. While in the CT symptoms improved somewhat but patient did not return to baseline. BP was markedly elevated. Patient reported being noncompliant with his antihypertensive and ASA for 9 months. He received IV TPA.   Subjective/Complaints: Cough, clear sputum no SOB or CP no sweats or chills Feels ok today Review of Systems  Constitutional: Negative.   HENT: Positive for congestion.   Eyes: Negative.   Respiratory: Positive for cough.   Cardiovascular: Negative.   Gastrointestinal: Negative.   Genitourinary: Negative.   Musculoskeletal: Negative.   Skin: Negative.   Neurological: Positive for sensory change and focal weakness.  Endo/Heme/Allergies: Negative.   Psychiatric/Behavioral: Negative.    Objective: Vital Signs: Blood pressure 144/97, pulse 65, temperature 98.2 F (36.8 C), temperature source Oral, resp. rate 20, height 6\' 1"  (1.854 m), weight 147.328 kg (324 lb 12.8 oz), SpO2 97.00%. No results found. No results found for this or any previous visit (from the past 72 hour(s)).   HEENT: normal. Physical Exam  Constitutional: He is oriented to person, place, and time. He appears well-developed and well-nourished.  HENT:  Head: Normocephalic and atraumatic.  Right Ear: External ear normal.  Left Ear: External ear normal.  Nose: Nose normal.  Eyes: Conjunctivae normal and EOM are normal. Pupils are equal, round, and reactive to light.  Neck: Normal range of motion. Neck supple.  Cardiovascular: An irregularly irregular rhythm present. Exam reveals no gallop.   No murmur heard. Pulmonary/Chest: Effort  normal and breath sounds normal.  Abdominal: Soft. Bowel sounds are normal. He exhibits no distension.  Musculoskeletal: He exhibits edema. He exhibits no tenderness.  Neurological: He is alert and oriented to person, place, and time. He has normal reflexes.  Skin: Skin is warm and dry.  Psychiatric: He has a normal mood and affect. His behavior is normal. Judgment and thought content normal.  3/5 Left deltoid biceps, triceps and 3/5 grip, 3/5 L HF, KE and ankle DF/PF 5/5 on R side Assessment/Plan: 1. 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 Physiatrist is providing close team supervision and 24 hour management of active medical problems listed below. Physiatrist and rehab team continue to assess barriers to discharge/monitor patient progress toward functional and medical goals. FIM: FIM - Bathing Bathing Steps Patient Completed: Front perineal area;Buttocks Bathing: 3: Mod-Patient completes 5-7 80f 10 parts or 50-74%  FIM - Upper Body Dressing/Undressing Upper body dressing/undressing steps patient completed: Thread/unthread right sleeve of pullover shirt/dresss;Thread/unthread left sleeve of pullover shirt/dress;Put head through opening of pull over shirt/dress;Pull shirt over trunk Upper body dressing/undressing: 1: Total-Patient completed less than 25% of tasks FIM - Lower Body Dressing/Undressing Lower body dressing/undressing steps patient completed: Don/Doff right shoe Lower body dressing/undressing: 1: Total-Patient completed less than 25% of tasks  FIM - Toileting Toileting steps completed by patient: Adjust clothing prior to toileting;Performs perineal hygiene;Adjust clothing after toileting Toileting: 4: Steadying assist  FIM - Diplomatic Services operational officer Devices: Grab bars Toilet Transfers: 4-To toilet/BSC: Min A (steadying Pt. >  75%)  FIM - Banker Devices: Bed rails Bed/Chair Transfer: 5: Supine > Sit: Supervision (verbal cues/safety issues)  FIM - Locomotion: Wheelchair Distance: 0 Locomotion: Wheelchair: 0: Activity did not occur FIM - Locomotion: Ambulation Ambulation/Gait Assistance: 4: Min guard Locomotion: Ambulation: 2: Travels 50 - 149 ft with moderate assistance (Pt: 50 - 74%)  Comprehension Comprehension Mode: Auditory Comprehension: 5-Understands complex 90% of the time/Cues < 10% of the time  Expression Expression Mode: Verbal Expression: 5-Expresses basic needs/ideas: With no assist  Social Interaction Social Interaction: 6-Interacts appropriately with others with medication or extra time (anti-anxiety, antidepressant).  Problem Solving Problem Solving: 5-Solves basic 90% of the time/requires cueing < 10% of the time  Memory Memory: 5-Recognizes or recalls 90% of the time/requires cueing < 10% of the time  2. Anticoagulation/DVT prophylaxis with Pharmaceutical: Xarelto 3. Pain Management: tylenol  Medical Problem List and Plan:  1. DVT Prophylaxis/Anticoagulation: Pharmaceutical:Xarelto, also for secondary stroke prevention  2. Pain Management: N/A  3. Mood: Seems to have a good outlook.  4. Neuropsych: This patient is capable of making decisions on his/her own behalf.  5 HTN: uncontrolled Monitor with bid checks. Continue coreg, Cardizem, Avapro and HCTZ. Add cardura Continue K+ supplement as patient hypokalemic and is on diuretic. Recheck labs in am.  6. Atrial Fibrillation: monitor HR with bid check. Continue coreg and cardizem for HR control. Continue xarelto.  7. Morbid obesity: Will have dietician educate on heart healthy diet.  8. ?CKD: baseline Cr was 1.35 in 9/11 and 1.6 at admission. Will monitor along as now with ace and diuretic on board.  LOS (Days) 5 A FACE TO FACE EVALUATION WAS PERFORMED  Thomas [name] E 12/14/2012, 7:33 AM

## 2012-12-14 NOTE — Progress Notes (Signed)
Physical Therapy Session Note  Patient Details  Name: Thomas [name] MRN: [id number] Date of Birth: June 04, 1981  Today's Date: 12/14/2012 Time: 4098-1191 and 4782-9562 Time Calculation (min): 60 min and 43 min  Short Term Goals: Week 1:  PT Short Term Goal 1 (Week 1): STGs=LTGs secondary to short LOS  Skilled Therapeutic Interventions/Progress Updates:   Patient performed community stair negotiation training with up and down 12 stairs x 2 reps with one UE support on R rail with alternating sequence to ascend and descend with supervision.  Also performed LUE functional training with door knob turning and opening with min A for wrist positioning and bilat UE holding basketball in midline and maintaining contact with basketball during 10 reps chest presses, and overhead presses with min A to maintain pressure through LUE; performed dynamic standing balance, gait, dual task and LUE training with bilat UE basketball bouncing x 75' forwards, retro stepping, R and L lateral stepping with supervision/min A with cues for use of bilat UE to reach to floor to pick up ball when dropped; patient presents with slowed gait forwards, retro and laterally secondary to increased mental attention to LUE placement, motor control, timing, sequencing.  Discussed with patient the possibility of participating in community outing this week with therapy for problem solving community barriers.    PM session:  Patient's family present; answered questions about possible LOS, D/C date, goals for D/C and home set up.  Mother is concerned that patient may have sleep apnea and this is a reason for his infarct.  Will pass message and family's concern on to MD.  Patient performed functional community gait training >1,000 with supervision on/off elevators with use of LUE to push correct buttons, over tile, carpet, multiple direction changes within hospital and outdoors over uneven pavement and brick, compliant grass, uphill, downhill, up and  down 5 stairs with LUE support on rail, crossing street quickly, up and down curb with one rest break secondary to L hip tightness.  Per patient's description of LE fatigue and location tightness provided patient with seated L piriformis stretch; patient reports strong stretch in sitting; also provided patient with one time use hot pack to L hip/buttocks for pain and discomfort relief.  Also provided patient with handout of OTAGO HEP for LE strength and balance.  Will review exercises tomorrow with patient.    Therapy Documentation Precautions:  Precautions Precautions: Fall Restrictions Weight Bearing Restrictions: No Vital Signs: Therapy Vitals Pulse Rate: 55  Pain: Pain Assessment Pain Assessment: No/denies pain Locomotion : Ambulation Ambulation/Gait Assistance: 5: Supervision  Other Treatments: Treatments Neuromuscular Facilitation: Left;Upper Extremity;Forced use;Activity to increase coordination;Activity to increase motor control;Activity to increase timing and sequencing;Activity to increase grading;Activity to increase sustained activation;Activity to increase lateral weight shifting;Activity to increase anterior-posterior weight shifting;Lower Extremity  See FIM for current functional status  Therapy/Group: Individual Therapy  [name] 12/14/2012, 11:00 AM

## 2012-12-14 NOTE — Progress Notes (Signed)
Occupational Therapy Session Note  Patient Details  Name: Thomas [name] MRN: [id number] Date of Birth: 05/25/1981  Today's Date: 12/14/2012 Time: 4098-1191 Time Calculation (min): 57 min  Short Term Goals: Week 1:  OT Short Term Goal 1 (Week 1): STG = LTGs due to short ELOS  Skilled Therapeutic Interventions/Progress Updates:    Pt seen for ADL retraining with focus on increased attention and functional use of LUE with self-care tasks of bathing and dressing.  Pt finishing breakfast upon arrival, verbal cues for attention to Lt hand placement and attempts to incorporate in opening containers.  Pt distant supervision with ambulation around room to gather clothing prior to bathing at shower level with distant supervision.  Pt required mod verbal cues with dressing for sequencing of hemi-technique and to compensate for ?perceptual difficulties.  Verbal cues for attention and hand over hand for sustained grasp with Lt hand with pulling up pants.  Pt required increased time with dressing tasks this session secondary to increased perceptual deficits.  Therapy Documentation Precautions:  Precautions Precautions: Fall Restrictions Weight Bearing Restrictions: No Pain:  Pt with no c/o pain this session.  See FIM for current functional status  Therapy/Group: Individual Therapy  Leonette Monarch 12/14/2012, 9:13 AM

## 2012-12-15 ENCOUNTER — Inpatient Hospital Stay (HOSPITAL_COMMUNITY): Payer: 59 | Admitting: *Deleted

## 2012-12-15 ENCOUNTER — Inpatient Hospital Stay (HOSPITAL_COMMUNITY): Payer: 59 | Admitting: Occupational Therapy

## 2012-12-15 ENCOUNTER — Inpatient Hospital Stay (HOSPITAL_COMMUNITY): Payer: 59 | Admitting: Physical Therapy

## 2012-12-15 NOTE — Progress Notes (Signed)
Occupational Therapy Session Note  Patient Details  Name: Zaccheaus Storlie MRN: [id number] Date of Birth: 1981/01/19  Today's Date: 12/15/2012 Time: 0732-0815 Time Calculation (min): 43 min  Short Term Goals: Week 1:  OT Short Term Goal 1 (Week 1): STG = LTGs due to short ELOS  Skilled Therapeutic Interventions/Progress Updates:    Pt seen for ADL retraining with focus on increased attention and functional use of LUE with self-care tasks of bathing and dressing. Pt asleep upon arrival and willing to get OOB to participate in treatment session. Pt required verbal cues for attention to Lt hand placement with bathing and dressing and attempts to incorporate in opening containers. Pt distant supervision with ambulation around room to gather clothing prior to bathing at shower level with distant supervision. Re-educated pt on hemi-technique with dressing tasks with pt with increased carryover with UB dressing.  Hand over hand assist with grasp and release with LUE to pull up pants.  Use of mirror for visual cues and feedback with LB dressing.     Therapy Documentation Precautions:  Precautions Precautions: Fall Restrictions Weight Bearing Restrictions: No General:   Vital Signs: Therapy Vitals Temp: 98.7 F (37.1 C) Temp src: Oral Pulse Rate: 60  Resp: 20  BP: 140/87 mmHg Oxygen Therapy SpO2: 98 % Pain:  Pt with no c/o pain this session.  See FIM for current functional status  Therapy/Group: Individual Therapy  Leonette Monarch 12/15/2012, 8:59 AM

## 2012-12-15 NOTE — Progress Notes (Signed)
Occupational Therapy Session Note  Patient Details  Name: Pradeep Beaubrun MRN: [id number] Date of Birth: 1981-10-19  Today's Date: 12/15/2012 Time: 2440-1027 Time Calculation (min): 45 min  Short Term Goals: Week 1:  OT Short Term Goal 1 (Week 1): STG = LTGs due to short ELOS  Skilled Therapeutic Interventions/Progress Updates:     Pt engaged in therapeutic activities with focus on standing balance and forced use of Lt UE. Pt completed wall pushups to facilitate WB to improve muscle strength and motor control of Lt UE. Pt required hand over hand assist to maintain position of Lt hand on wall due to muscle weakness. Pt completed modified pushups with weight bearing on Lt UE while seated on mat table. Pt engaged in Wii activity with focus on standing balance and B UE ROM and strengthening. Educated pt on finger lacing technique to increase use of Lt UE during functional tasks. Pt completed seated gross grasp and release activity using Lt hand. Pt requires VC to use vision and direct attention to Lt UE to compensate for lack of sensation and to improve motor control.   Therapy Documentation Precautions:  Precautions Precautions: Fall Restrictions Weight Bearing Restrictions: No    Pain: Pain Assessment Pain Assessment: No pain reported  See FIM for current functional status  Therapy/Group: Individual Therapy  [name] 12/15/2012, 3:18 PM

## 2012-12-15 NOTE — Consult Note (Signed)
NEUROCOGNITIVE TESTING - [facility]   [name] is a 32 year old, left-handed, African American man, who was seen for brief neuropsychological assessment to evaluate cognitive and emotional functioning post-stroke.  According to his medical record, he was admitted on 12/07/2012 with left-sided weakness and slurred speech.  A CT of his head revealed indistinctness in right periopercular region.  Follow up cranial CT suggested early right MCA infarct without visible hemorrhage.  Notably, his medical history also includes atrial fibrillation and CM.    PROCEDURES: [3 units of 09811 on 12/14/2012]  The following tests were performed during today's visit: Repeatable Battery for the Assessment of Neuropsychological Status (RBANS, form A), Beck Depression Inventory (Fast Screen for medical patients), as well as performance validity measures.  Test results are as follows:   RBANS Indices Scaled Score Percentile Description  Immediate Memory  87 19 Below Average  Visuospatial/Constructional 58 < 1 Profoundly Impaired  Language 85 16 Below Average  Attention n/a n/a n/a  Delayed Memory 74 4 Impaired  Total Score n/a n/a n/a   RBANS Subtests Raw Score Percentile Description  List Learning 30 42 Average  Story Memory 17 25 Average  Figure Copy 12 < 1 Profoundly Impaired  Line Orientation 9 < 1 Profoundly Impaired  Picture Naming 10 70 Average  Semantic Fluency 16 6 Impaired  Digit Span 10 25 Average  Coding n/a n/a n/a  List Recall 5 8 Impaired  List Recognition 20 61 Average  Story Recall 7 7 Impaired  Figure recall 1 < 1 Profoundly Impaired   BDI-fast Raw Score = 0/20 Description = WNL   [name] scores on objective and embedded measures of performance validity were variable, with certain indicators falling outside the normal range.  However, the ones that were abnormal were likely impacted by poor visuospatial skills; a likely genuine deficit  secondary to the area damaged in his stroke.  As such, test results likely represent an accurate portrayal of his current level of cognitive functioning.  Notably, [name] is left-handed, but has very limited use of his left hand.  As such, certain tests requiring writing were not administered.    Test results revealed significant impairment, particularly in visuospatial abilities and processing speed, with additional impairment noted in aspects of delayed memory.  Of importance, his ability to encode and recognize learned information was intact.  He also remains intact in abilities for basic confrontation naming and simple attention.  His areas of most obvious impairment are expected and consistent with the location of his stroke.  As such, recovery over time is expected, though it remains unclear whether he will fully return to baseline.    [name] responses to a self-report measure of mood symptoms were not suggestive of the presence of significant depression at this time.    In light of these findings, the following recommendations are provided.    RECOMMENDATIONS:  Recommendations for treatment team:     [name] demonstrated most difficulty with visuospatial skills, indicating that he may bump into things when walking or may drop items that he is trying to set down.  Staff should be aware that his visual judgment may be off and should monitor accordingly for safety purposes.     When interacting with [name], directions and information should be provided in a simple, straight forward manner, and the treatment team should avoid giving multiple instructions simultaneously.    [name] may benefit from being provided with reminder cues when  he is struggling to recall information, as his free recall was reduced, but he significantly benefitted from provision of recognition cues.     To the extent possible, multitasking should be avoided.   [name] requires more time than typical  to process information. The treatment team may benefit from waiting for a verbal response to information before presenting additional information.    Performance will generally be best in a structured, routine, and familiar environment, as opposed to situations involving complex problems.   Recommendations for discharge planning:     Complete a comprehensive neuropsychological evaluation as an outpatient in 6-12 months to assess for interval change.  To schedule this with Dr. Wylene Simmer, he could contact her at: [phone].  This contact information should be provided to him upon discharge.     Maintain engagement in mentally, physically and cognitively stimulating activities.    Strive to maintain a healthy lifestyle (e.g., proper diet and exercise) in order to promote physical, cognitive and emotional health.    Due to the nature and severity of the symptoms noted during this evaluation, particularly impairment in visuospatial abilities, it is recommended that [name] refrain from driving, unless he is able to pass a formal on-the-road evaluation from the [facility].    Leavy Cella, Psy.D.  Clinical Neuropsychologist

## 2012-12-15 NOTE — Evaluation (Signed)
Recreational Therapy Assessment and Plan  Patient Details  Name: Thomas [name] MRN: [id number] Date of Birth: March 19, 1981 Today's Date: 12/15/2012  Rehab Potential: Good ELOS: 10 days   Assessment Clinical Impression: Problem List:  Patient Active Problem List   Diagnosis   .  Overweight   .  HYPERTENSION, BENIGN ESSENTIAL, UNCONTROLLED   .  PAROXYSMAL SUPRAVENTRICULAR TACHYCARDIA   .  Atrial fibrillation   .  VENTRICULAR HYPERTROPHY, LEFT   .  HEMATURIA UNSPECIFIED   .  CARDIOMYOPATHY   .  RENAL INSUFFICIENCY   .  PALPITATIONS   .  Cerebral embolism with cerebral infarction   .  Hemiplegia, unspecified, affecting nondominant side   .  Atrial fibrillation with RVR   .  HTN (hypertension), malignant   .  Hypokalemia   .  Morbid obesity with body mass index of 40.0-44.9 in adult   .  CVA (cerebral infarction)   .  Hemiplegia affecting dominant side   .  Noncompliance with medications    Past Medical History:  Past Medical History   Diagnosis  Date   .  Cardiomyopathy  11/28/2010   .  Palpitation  11/28/2010   .  Atrial fibrillation  10/29/2010   .  Benign essential hypertension  12/15/2008   .  Paroxysmal SVT (supraventricular tachycardia)  12/15/2008   .  Ventricular hypertrophy  12/15/2008     left sided, ECHO from 2010 - left ventricular size was normal, overall left ventricular systolic function was normal   .  Renal insufficiency  11/28/2010   .  Overweight  08/23/2010   .  Hematuria, unspecified  12/15/2008    Past Surgical History: No past surgical history on file.  Assessment & Plan  Clinical Impression: Thomas [name] is a 32 y.o. LH-male with history of CM, atrial fibrillation, morbid obesity, who was admitted on 12/07/12 with left sided weakness and slurred speech. BP markedly elevated 212/132 and patient with history of non compliance with antihypertensives and ASA for past 9 months (did feel he needed them). CT head with indistinctness in right periopercular  region and once BP stabilized, he was treated with tPA. Follow up CCT with evolving cytotoxic edema suggesting early R-MCA infarct and no visible hemorrhage past tPA. Dr. Gala Romney consulted for input on atrial fibrillation and BP. He recommended resuming home meds and addition of Cardizem for rate control. Will need anticoagulation as well as outpatient sleep study. 2D echo done revealing EF 50-55% with dilated left atrium and severe concentric hypertrophy. Carotid dopplers without ICA stenosis. Neurology recommended Xarelto for CVA prophylaxis and to help with compliance. Patient continues with left sided weakness with decreased balance. Patient transferred to CIR on 12/09/2012 .  Patient presents with activity tolerance, decreased functional mobility, left sided weakness, left inattention limiting pt's independence with leisure/community pursuits. Leisure History/Participation Premorbid leisure interest/current participation: Garment/textile technologist - Travel (Comment);Community - Journalist, newspaper - Engineer, civil (consulting) Expression Interests: Music (Comment) Other Leisure Interests: Television;Movies Leisure Participation Style: With Family/Friends;Alone Awareness of Community Resources: Good-identify 3 post discharge leisure resources Psychosocial / Spiritual Stress Management: Good Methods of Stress Management: step away, regain my cool, analyze & make changes Social interaction - Mood/Behavior: Cooperative Firefighter Appropriate for Education?: Yes Recreational Therapy Orientation Orientation -Reviewed with patient: Available activity resources Strengths/Weaknesses Patient Strengths/Abilities: Willingness to participate;Active premorbidly Patient weaknesses: Physical limitations  Plan Rec Therapy Plan Is patient appropriate for Therapeutic Recreation?: Yes Rehab Potential: Good Treatment times per week: Min 2 times per week >20 minutes Estimated  Length of Stay: 10 days TR  Treatment/Interventions: Adaptive equipment instruction;1:1 session;Balance/vestibular training;Community reintegration;Cognitive remediation/compensation;Functional mobility training;Patient/family education;Recreation/leisure participation;Therapeutic activities;UE/LE Coordination activities Recommendations for other services: Neuropsych  Recommendations for other services: Neuropsych  Discharge Criteria: Patient will be discharged from TR if patient refuses treatment 3 consecutive times without medical reason.  If treatment goals not met, if there is a change in medical status, if patient makes no progress towards goals or if patient is discharged from hospital.  The above assessment, treatment plan, treatment alternatives and goals were discussed and mutually agreed upon: by patient  Hydee Fleece 12/15/2012, 3:24 PM

## 2012-12-15 NOTE — Progress Notes (Signed)
Patient ID: Thomas [name], male   DOB: Nov 29, 1981, 32 y.o.   MRN: [id number] Thomas [name] is an 32 y.o. male with a history of hypertension and atrial fibrillation who was at home and went to the bathroom with no complaints. While in the bathroom developed slurred speech and left sided weakness. EMS was called at that time and the patient was brought in 12/07/2012 as a code stroke. While in the CT symptoms improved somewhat but patient did not return to baseline. BP was markedly elevated. Patient reported being noncompliant with his antihypertensive and ASA for 9 months. He received IV TPA.   Subjective/Complaints: Cough, clear sputum no SOB or CP no sweats or chills Feels ok today Review of Systems  Constitutional: Negative.   HENT: Positive for congestion.   Eyes: Negative.   Respiratory: Positive for cough.   Cardiovascular: Negative.   Gastrointestinal: Negative.   Genitourinary: Negative.   Musculoskeletal: Negative.   Skin: Negative.   Neurological: Positive for sensory change and focal weakness.  Endo/Heme/Allergies: Negative.   Psychiatric/Behavioral: Negative.    Objective: Vital Signs: Blood pressure 140/87, pulse 60, temperature 98.7 F (37.1 C), temperature source Oral, resp. rate 20, height 6\' 1"  (1.854 m), weight 147.328 kg (324 lb 12.8 oz), SpO2 98.00%. No results found. No results found for this or any previous visit (from the past 72 hour(s)).   HEENT: normal. Physical Exam  Constitutional: He is oriented to person, place, and time. He appears well-developed and well-nourished.  HENT:  Head: Normocephalic and atraumatic.  Right Ear: External ear normal.  Left Ear: External ear normal.  Nose: Nose normal.  Eyes: Conjunctivae normal and EOM are normal. Pupils are equal, round, and reactive to light.  Neck: Normal range of motion. Neck supple.  Cardiovascular: An irregularly irregular rhythm present. Exam reveals no gallop.   No murmur heard. Pulmonary/Chest: Effort  normal and breath sounds normal.  Abdominal: Soft. Bowel sounds are normal. He exhibits no distension.  Musculoskeletal: He exhibits edema. He exhibits no tenderness.  Neurological: He is alert and oriented to person, place, and time. He has normal reflexes.  Skin: Skin is warm and dry.  Psychiatric: He has a normal mood and affect. His behavior is normal. Judgment and thought content normal.  3/5 Left deltoid biceps, triceps and 3/5 grip, 4/5 L HF, KE and ankle DF/PF 5/5 on R side Sensation mildly reduced L Hand Assessment/Plan: 1. 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 Physiatrist is providing close team supervision and 24 hour management of active medical problems listed below. Physiatrist and rehab team continue to assess barriers to discharge/monitor patient progress toward functional and medical goals. FIM: FIM - Bathing Bathing Steps Patient Completed: Chest;Right Arm;Left Arm;Abdomen;Front perineal area;Buttocks;Right upper leg;Left upper leg;Right lower leg (including foot);Left lower leg (including foot) Bathing: 5: Supervision: Safety issues/verbal cues  FIM - Upper Body Dressing/Undressing Upper body dressing/undressing steps patient completed: Thread/unthread right sleeve of pullover shirt/dresss;Thread/unthread left sleeve of pullover shirt/dress;Put head through opening of pull over shirt/dress;Pull shirt over trunk Upper body dressing/undressing: 4: Min-Patient completed 75 plus % of tasks FIM - Lower Body Dressing/Undressing Lower body dressing/undressing steps patient completed: Thread/unthread right underwear leg;Thread/unthread left underwear leg;Pull underwear up/down;Thread/unthread right pants leg;Thread/unthread left pants leg;Pull pants up/down Lower body dressing/undressing: 5: Supervision: Safety issues/verbal cues  FIM - Toileting Toileting  steps completed by patient: Adjust clothing prior to toileting;Performs perineal hygiene;Adjust clothing after toileting  Toileting: 5: Supervision: Safety issues/verbal cues  FIM - Diplomatic Services operational officer Devices: Grab bars Toilet Transfers: 5-To toilet/BSC: Supervision (verbal cues/safety issues);5-From toilet/BSC: Supervision (verbal cues/safety issues)  FIM - Banker Devices: Bed rails Bed/Chair Transfer: 5: Bed > Chair or W/C: Supervision (verbal cues/safety issues);5: Chair or W/C > Bed: Supervision (verbal cues/safety issues)  FIM - Locomotion: Wheelchair Distance: 0 Locomotion: Wheelchair: 0: Activity did not occur FIM - Locomotion: Ambulation Ambulation/Gait Assistance: 5: Supervision Locomotion: Ambulation: 5: Travels 150 ft or more with supervision/safety issues  Comprehension Comprehension Mode: Auditory Comprehension: 5-Understands basic 90% of the time/requires cueing < 10% of the time  Expression Expression Mode: Verbal Expression: 5-Expresses basic 90% of the time/requires cueing < 10% of the time.  Social Interaction Social Interaction: 6-Interacts appropriately with others with medication or extra time (anti-anxiety, antidepressant).  Problem Solving Problem Solving: 5-Solves basic 90% of the time/requires cueing < 10% of the time  Memory Memory: 5-Recognizes or recalls 90% of the time/requires cueing < 10% of the time  2. Anticoagulation/DVT prophylaxis with Pharmaceutical: Xarelto 3. Pain Management: tylenol  Medical Problem List and Plan:  1. DVT Prophylaxis/Anticoagulation: Pharmaceutical:Xarelto, also for secondary stroke prevention  2. Pain Management: N/A  3. Mood: Seems to have a good outlook.  4. Neuropsych: This patient is capable of making decisions on his/her own behalf.  5 HTN: uncontrolled Monitor with bid checks. Continue coreg, Cardizem, Avapro and HCTZ. Added cardura 1/13, BP  better this am Continue K+ supplement as patient hypokalemic and is on diuretic. Recheck labs in am.  6. Atrial Fibrillation: monitor HR with bid check. Continue coreg and cardizem for HR control. Continue xarelto.  7. Morbid obesity: Will have dietician educate on heart healthy diet.  8. ?CKD: baseline Cr was 1.35 in 9/11 and 1.6 at admission. Will monitor along as now with ace and diuretic on board.  LOS (Days) 6 A FACE TO FACE EVALUATION WAS PERFORMED  Essie Lagunes E 12/15/2012, 8:04 AM

## 2012-12-15 NOTE — Progress Notes (Signed)
Physical Therapy Session Note  Patient Details  Name: Thomas [name] MRN: [id number] Date of Birth: Oct 09, 1981  Today's Date: 12/15/2012 Time: 1117-1200 Time Calculation (min): 43 min  Short Term Goals: Week 1:  PT Short Term Goal 1 (Week 1): STGs=LTGs secondary to short LOS  Skilled Therapeutic Interventions/Progress Updates:   Patient reporting increased fatigued today and feeling cold secondary to blood thinners.  Patient is safe with transfers and gait within room.  Patient upgraded to mod I in the room for bed <> chair and bed <> toilet transfers.  Patient performed gait in controlled environment x 150' x 3 with supervision with intermittent verbal cues for guidance and obstacle negotiation.  Reviewed with patient the handout on the OTAG[employer] HEP for LE strength and balance and patient gave repeat demonstration of each LE exercise and balance exercise with one UE support for balance and intermittent rest break secondary to LLE fatigue.  Discussed with patient falls risk after CVA and discussed how to perform floor > furniture transfer and indications for calling EMS.  Patient gave repeat demonstration mod I.  Patient performed functional task with LUE with reaching into washer and transferring clothes from washer to dryer with supervision.  Returned to room and patient donned sweat shirt mod I but did require assistance for zipper.    Therapy Documentation Precautions:  Precautions Precautions: Fall Restrictions Weight Bearing Restrictions: No Pain: Pain Assessment Pain Assessment: No/denies pain (reports significant fatigue) Pain Score: 0-No pain  See FIM for current functional status  Therapy/Group: Individual Therapy  Edman Circle [facility] 12/15/2012, 12:38 PM

## 2012-12-15 NOTE — Progress Notes (Signed)
Reviewed and agree with the attached treatment note.  Tyreek Clabo, OTR/L 

## 2012-12-15 NOTE — Progress Notes (Signed)
Physical Therapy Session Note  Patient Details  Name: Thomas [name] MRN: [id number] Date of Birth: 1981/05/23  Today's Date: 12/15/2012 Time: 0830-0930 Time Calculation (min): 60 min  Short Term Goals: Week 1:  PT Short Term Goal 1 (Week 1): STGs=LTGs secondary to short LOS  Skilled Therapeutic Interventions/Progress Updates:    Today's session focused on NMR of L LE and L UE, community ambulation and stair negotiation, and occupation-related tasks. Patient performs all mobility throughout session with supervision and did not experience any overt LOB with any functional mobility task. Patient performs ambulation in community setting >1,000' with supervision. Patient able to negotiate obstacle and people in crowded hallways with only minimal verbal cues for safety. Patient negotiated 24 community stairs with R handrail and supervision. Patient able to adjust stair negotiation appropriately when stairwell needed to be shared with other people.  Patient ambulated 200' with supervision with two 12-pack soda cartons to simulate work-related duties of carrying heavy objects. Patient uses B UEs to cradle and demonstrates good strength with L biceps to hold. 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 Wheelchair propulsion of person emphasized maintaining L grip on wheelchair handle. L grip fatigues quickly, but patient able to recognize when L hand is off of handle and readjust.  Patient performed R UE reaching x10 in quadruped to facilitate L UE weight bearing. Patient requires verbal cues to maintain weight anteriorly and over L UE. Patient performs contralateral extension of L LE and R UE (bird dog) x10 to facilitate increased L UE weight bearing.   Patient returned to room secondary to requests to clean up room in preparation for visitors this afternoon. Patient ambulates around room, picking up two pairs of shoes,  picking up dirty laundry from floor and placing it in laundry bag, stripping bed of sheets. Patient ambulates with laundry bag in L UE to put clothes in wash. Patient uses L hand to put clothes in washing machine and turn on machine. Patient ambulates to retrieve cup of water and carries it back to room. All functional mobility tasks performed with supervision and without any overt LOB.  Patient left seated in recliner with all needs within reach.  Therapy Documentation Precautions:  Precautions Precautions: Fall Restrictions Weight Bearing Restrictions: No Pain: Pain Assessment Pain Assessment: No/denies pain Pain Score: 0-No pain Mobility: Bed Mobility Bed Mobility: Supine to Sit;Sit to Supine Supine to Sit: 7: Independent Sit to Supine: 7: Independent Transfers Sit to Stand: 5: Supervision;From bed;From chair/3-in-1;Without upper extremity assist Sit to Stand Details: Verbal cues for precautions/safety Stand to Sit: 5: Supervision;With armrests;To bed;To chair/3-in-1;Without upper extremity assist Stand to Sit Details (indicate cue type and reason): Verbal cues for precautions/safety Locomotion : Ambulation Ambulation: Yes Ambulation/Gait Assistance: 5: Supervision Ambulation Distance (Feet): >1000 Feet Assistive device: None Ambulation/Gait Assistance Details: Verbal cues for precautions/safety Gait Gait: Yes Gait Pattern: Step-through pattern Stairs / Additional Locomotion Stairs: Yes Stairs Assistance: 5: Supervision Stairs Assistance Details: Verbal cues for precautions/safety Stair Management Technique: One rail Right;Forwards;Alternating pattern Number of Stairs: 24  Height of Stairs: 6  Wheelchair Mobility Wheelchair Mobility: No Distance: 0   See FIM for current functional status  Therapy/Group: Individual Therapy  Chipper Herb. Russie Gulledge, PT, DPT  12/15/2012, 10:29 AM

## 2012-12-16 ENCOUNTER — Inpatient Hospital Stay (HOSPITAL_COMMUNITY): Payer: 59 | Admitting: Physical Therapy

## 2012-12-16 ENCOUNTER — Inpatient Hospital Stay (HOSPITAL_COMMUNITY): Payer: 59 | Admitting: Occupational Therapy

## 2012-12-16 ENCOUNTER — Encounter (HOSPITAL_COMMUNITY): Payer: 59

## 2012-12-16 LAB — CREATININE, SERUM
Creatinine, Ser: 2.56 mg/dL — ABNORMAL HIGH (ref 0.50–1.35)
GFR calc Af Amer: 37 mL/min — ABNORMAL LOW (ref >90)
GFR calc non Af Amer: 32 mL/min — ABNORMAL LOW (ref >90)

## 2012-12-16 MED ORDER — HYDROCHLOROTHIAZIDE 12.5 MG PO CAPS
12.5000 mg | ORAL_CAPSULE | Freq: Every day | ORAL | Status: DC
  Administered 2012-12-16 – 2012-12-18 (×3): 12.5 mg via ORAL
  Filled 2012-12-16 (×4): qty 1

## 2012-12-16 MED ORDER — HYDROCHLOROTHIAZIDE 25 MG PO TABS
12.5000 mg | ORAL_TABLET | Freq: Every day | ORAL | Status: DC
  Filled 2012-12-16 (×2): qty 0.5

## 2012-12-16 MED ORDER — SODIUM CHLORIDE 0.45 % IV SOLN: INTRAVENOUS | Status: DC

## 2012-12-16 MED ORDER — SPIRONOLACTONE 25 MG PO TABS
25.0000 mg | ORAL_TABLET | Freq: Every day | ORAL | Status: DC
  Administered 2012-12-16 – 2012-12-18 (×3): 25 mg via ORAL
  Filled 2012-12-16 (×4): qty 1

## 2012-12-16 NOTE — Progress Notes (Signed)
Occupational Therapy Session Note  Patient Details  Name: Thomas [name] MRN: [id number] Date of Birth: Apr 10, 1981  Today's Date: 12/16/2012 Time: 6295-2841 Time Calculation (min): 45 min  Short Term Goals: Week 1:  OT Short Term Goal 1 (Week 1): STG = LTGs due to short ELOS     Skilled Therapeutic Interventions/Progress Updates:  Pt seen for home management/simple meal prep retraining with focus on safety and functional use of LUE during home management tasks. Pt completed tub shower transfer with VC to place LLE into tub first before stepping in. Recommend non slip mat for safety in tub shower at home. Pt educated on using Rt hand to check water temps for safety due to decreased sensation in Lt hand. Pt completed simple meal prep in ADL kitchen with VC and hand over hand assist to use Lt hand to stabilize items while using Rt hand to open packages and containers. After WB exercise to improve motor control in LUE, pt washed dishes in sink using BUE with VC to check water temp with Rt hand. Pt educated on safety technique of using vision to compensate for loss of sensation in Lt hand when handling sharp objects.     Therapy Documentation Precautions:  Precautions Precautions: Fall Restrictions Weight Bearing Restrictions: No General: General Amount of Missed OT Time (min): 5 Minutes Vital Signs: Therapy Vitals Temp: 97.4 F (36.3 C) Pulse Rate: 61  Resp: 18  BP: 114/64 mmHg Patient Position, if appropriate: Lying Oxygen Therapy SpO2: 97 % Pain:   No pain reported  See FIM for current functional status  Therapy/Group: Individual Therapy  [name] 12/16/2012, 3:10 PM

## 2012-12-16 NOTE — Progress Notes (Signed)
Physical Therapy Session Note  Patient Details  Name: Thomas [name] MRN: [id number] Date of Birth: 05/19/1981  Today's Date: 12/16/2012 Time: 0930-1025 Time Calculation (min): 55 min  Short Term Goals: Week 1:  PT Short Term Goal 1 (Week 1): STGs=LTGs secondary to short LOS  Skilled Therapeutic Interventions/Progress Updates:   Dynamic balance training, attempted using Bosu, but pt was unable to maintain balance on Bosu, changed to foam balance board.  Standing on balance board while throwing and catching a ball to address LUE and hand use.  Pt with several LOB off the board but able to catch himself and not fall.  Sitting on therapy ball for core strengthening and LE stabilization strengthening while playing checkers using only LUE/hand.  Pt struggled to be able to pick up large size checkers and move across the board, but determined to work on his hand.   Therapy Documentation Precautions:  Precautions Precautions: Fall Restrictions Weight Bearing Restrictions: No Pain:  No pain  See FIM for current functional status  Therapy/Group: Individual Therapy  Georges Mouse 12/16/2012, 12:43 PM

## 2012-12-16 NOTE — Progress Notes (Signed)
Nutrition Brief Note  Patient seen due to LOS.  Body mass index is 42.85 kg/(m^2). Patient meets criteria for extreme obesity based on current BMI.   See education note.  Current diet order is Heart Healthy, patient is consuming approximately 80-100% of meals at this time. Labs and medications reviewed.   Patient reported no issues at this time.  Eating well.   No nutrition interventions warranted at this time. If nutrition issues arise, please consult RD.   [name], RD, LDN Clinical Inpatient Dietitian Pager:  [phone] Weekend and after hours pager:  [phone]

## 2012-12-16 NOTE — Progress Notes (Signed)
Physical Therapy Session Note  Patient Details  Name: Thomas [name] MRN: [id number] Date of Birth: November 25, 1981  Today's Date: 12/16/2012 Time: 1400-1430 Time Calculation (min): 30 min  Short Term Goals: Week 1:  PT Short Term Goal 1 (Week 1): STGs=LTGs secondary to short LOS  Therapy Documentation Precautions:  Precautions Precautions: Fall Restrictions Weight Bearing Restrictions: No Vital Signs: Therapy Vitals Temp: 97.4 F (36.3 C) Pulse Rate: 61  Resp: 18  BP: 114/64 mmHg Patient Position, if appropriate: Lying Oxygen Therapy SpO2: 97 % Pain:  No c/o pain Mobility:  Reviewed with patient simulated car transfer; patient able to manage doors and enter and exit car without verbal cues for safety or sequence independently. Locomotion : Ambulation Ambulation/Gait Assistance: 7: Independent  Balance: Standardized Balance Assessment Standardized Balance Assessment: Berg Balance Test Berg Balance Test Sit to Stand: Able to stand without using hands and stabilize independently Standing Unsupported: Able to stand safely 2 minutes Sitting with Back Unsupported but Feet Supported on Floor or Stool: Able to sit safely and securely 2 minutes Stand to Sit: Sits safely with minimal use of hands Transfers: Able to transfer safely, minor use of hands Standing Unsupported with Eyes Closed: Able to stand 10 seconds safely Standing Ubsupported with Feet Together: Able to place feet together independently and stand 1 minute safely From Standing, Reach Forward with Outstretched Arm: Can reach confidently >25 cm (10") From Standing Position, Pick up Object from Floor: Able to pick up shoe safely and easily From Standing Position, Turn to Look Behind Over each Shoulder: Looks behind from both sides and weight shifts well Turn 360 Degrees: Able to turn 360 degrees safely in 4 seconds or less Standing Unsupported, Alternately Place Feet on Step/Stool: Able to stand independently and safely  and complete 8 steps in 20 seconds Standing Unsupported, One Foot in Front: Able to place foot tandem independently and hold 30 seconds Standing on One Leg: Able to lift leg independently and hold > 10 seconds Total Score: 56  Patient demonstrates increased fall risk as noted by score of 56/56 on Berg Balance Scale.  (<36= high risk for falls, close to 100%; 37-45 significant >80%; 46-51 moderate >50%; 52-55 lower >25%); also performed re-assessment of LLE strength.  See FIM for current functional status  Therapy/Group: Individual Therapy  Edman Circle [facility] 12/16/2012, 4:01 PM

## 2012-12-16 NOTE — Progress Notes (Signed)
Physical Therapy Discharge Summary  Patient Details  Name: Thomas [name] MRN: [id number] Date of Birth: [birth year]  Today's Date: 12/16/2012 and 12/17/2012 Time: 1400-1430 and 1310-1330 (12/17/12) Time Calculation (min): 30 min and 20 min  Patient has met 12 of 12 long term goals due to improved activity tolerance, improved balance, improved postural control, increased strength, ability to compensate for deficits, functional use of  left upper extremity and left lower extremity, improved attention and improved coordination.  Patient to discharge at an ambulatory level Independent.   Patient's care partner (mother, sister) is independent to provide the necessary household tasks and transportation assistance at discharge.  Reasons goals not met: N/A, all LTGs met  Recommendation:  Patient will benefit from ongoing skilled PT services in outpatient setting to continue to advance safe functional mobility, address ongoing impairments in impaired motor control and coordination of LUE, dynamic standing balance, gait and functional muscular endurance, and minimize fall risk to return to work and previous level of function.  Equipment: No equipment provided  Reasons for discharge: treatment goals met and discharge from hospital  Patient/family agrees with progress made and goals achieved: Yes  Skilled Interventions: Patient performed ambulation pushing someone in wheelchair and carrying 2 12-pack soda cases with independence. Patient performed car transfer with independence.  PT Discharge Precautions/Restrictions Precautions Precautions: None Restrictions Weight Bearing Restrictions: No Pain Pain Assessment Pain Assessment: No/denies pain Pain Score: 0-No pain Vision/Perception  Vision - Assessment Eye Alignment: Within Functional Limits  Cognition Overall Cognitive Status: Appears within functional limits for tasks assessed Arousal/Alertness: Awake/alert Orientation Level: Oriented  X4 Sensation Sensation Light Touch: Appears Intact (L LE) Light Touch Impaired Details: Absent LUE Stereognosis: Impaired Detail Stereognosis Impaired Details: Absent LUE Hot/Cold: Impaired Detail Hot/Cold Impaired Details: Impaired LUE Proprioception: Appears Intact Proprioception Impaired Details: Impaired LUE Coordination Gross Motor Movements are Fluid and Coordinated: No Fine Motor Movements are Fluid and Coordinated: No Coordination and Movement Description: Decreased speed and accuracy with rapid alternating movements of B UE and B LE Finger Nose Finger Test: dysmetria and undershooting with LUE Heel Shin Test: Very slight dysmetria/undershooting with L LE Motor  Motor Motor: Abnormal tone Motor - Discharge Observations: Very mild hypertonia in L hamstrings   Mobility Bed Mobility Bed Mobility: Supine to Sit;Sit to Supine Supine to Sit: 7: Independent Sit to Supine: 7: Independent Transfers Sit to Stand: 7: Independent Stand to Sit: 7: Independent Locomotion  Ambulation Ambulation: Yes Ambulation/Gait Assistance: 7: Independent Ambulation Distance (Feet): >1000 Feet Assistive device: None Gait Gait: Yes Gait Pattern: Within Functional Limits Gait Pattern: Step-through pattern Stairs / Additional Locomotion Stairs: Yes Stairs Assistance: 6: Modified independent (Device/Increase time) Stair Management Technique: One rail Right;Forwards;Alternating pattern Number of Stairs: 20  Height of Stairs: 6  Wheelchair Mobility Wheelchair Mobility: No Distance: 0  Balance Standardized Balance Assessment Standardized Balance Assessment: Hospital doctor Berg Balance Test Sit to Stand: Able to stand without using hands and stabilize independently Standing Unsupported: Able to stand safely 2 minutes Sitting with Back Unsupported but Feet Supported on Floor or Stool: Able to sit safely and securely 2 minutes Stand to Sit: Sits safely with minimal use of hands Transfers:  Able to transfer safely, minor use of hands Standing Unsupported with Eyes Closed: Able to stand 10 seconds safely Standing Ubsupported with Feet Together: Able to place feet together independently and stand 1 minute safely From Standing, Reach Forward with Outstretched Arm: Can reach confidently >25 cm (10") From Standing Position, Pick up Object from Floor: Able  to pick up shoe safely and easily From Standing Position, Turn to Look Behind Over each Shoulder: Looks behind from both sides and weight shifts well Turn 360 Degrees: Able to turn 360 degrees safely in 4 seconds or less Standing Unsupported, Alternately Place Feet on Step/Stool: Able to stand independently and safely and complete 8 steps in 20 seconds Standing Unsupported, One Foot in Front: Able to place foot tandem independently and hold 30 seconds Standing on One Leg: Able to lift leg independently and hold > 10 seconds Total Score: 56/56 Extremity Assessment  RLE Assessment RLE Assessment: Within Functional Limits LLE Strength LLE Overall Strength: Within Functional Limits for tasks assessed LLE Overall Strength Comments: 5/5 MMT; still presents with impaired muscular endurance in WB  See FIM for current functional status  Zella Richer Ripa 12/17/2012, 3:49 PM

## 2012-12-16 NOTE — Progress Notes (Signed)
Recreational Therapy Session Note  Patient Details  Name: Thomas [name] MRN: [id number] Date of Birth: 1981-01-29 Today's Date: 12/16/2012 Time: 930-10 Pain: no c/o  Skilled Therapeutic Interventions/Progress Updates: Dynamic balance training, attempted using Bosu, but pt was unable to maintain balance on Bosu, changed to foam balance board. Standing on balance board while throwing and catching a ball to address LUE and hand use. Pt with several LOB off the board but able to catch himself and not fall. Sitting on therapy ball for core strengthening and LE stabilization strengthening while playing checkers using only LUE/hand. Pt struggled to be able to pick up large size checkers and move across the board, but determined to work on his hand.    Therapy/Group: Co-Treatment  Kendrix Orman 12/16/2012, 5:14 PM

## 2012-12-16 NOTE — Plan of Care (Signed)
Problem: Food- and Nutrition-Related Knowledge Deficit (NB-1.1) Goal: Nutrition education Formal process to instruct or train a patient/client in a skill or to impart knowledge to help patients/clients voluntarily manage or modify food choices and eating behavior to maintain or improve health.  Outcome: Completed/Met Date Met:  12/16/12 Nutrition Education Note  RD saw patient for nutrition education regarding a Heart Healthy, weight loss diet.  Lipid Panel     Component Value Date/Time    CHOL 142 12/08/2012 0424    TRIG 98 12/08/2012 0424    HDL 44 12/08/2012 0424    CHOLHDL 3.2 12/08/2012 0424    VLDL 20 12/08/2012 0424    LDLCALC 78 12/08/2012 0424    RD provided "Heart Healthy Nutrition Therapy" and weight loss tips handouts from the [employer]. Reviewed patient's dietary recall. Provided examples on ways to decrease sodium and fat intake in diet. Discouraged intake of processed foods and use of salt shaker. Encouraged fresh fruits and vegetables as well as whole grain sources of carbohydrates to maximize fiber intake. Teach back method used.  Patient states he has eaten healthy in the past and was able to control HTN much better at that time.  Patient stated some of the changes he has made.  Encouraged patient. Expect good compliance.  Body mass index is 42.85 kg/(m^2). Pt meets criteria for extreme obesity  based on current BMI.  Current diet order is Heart Healthy, patient is consuming approximately 100% of meals at this time. Labs and medications reviewed. No further nutrition interventions warranted at this time. RD contact information provided. If additional nutrition issues arise, please consult RD.  [name], RD, LDN Clinical Inpatient Dietitian Pager:  [phone] Weekend and after hours pager:  [phone]

## 2012-12-16 NOTE — Progress Notes (Signed)
Patient ID: Jorian Willhoite, male   DOB: 06/21/1981, 32 y.o.   MRN: [id number] Nikholas Geffre is an 32 y.o. male with a history of hypertension and atrial fibrillation who was at home and went to the bathroom with no complaints. While in the bathroom developed slurred speech and left sided weakness. EMS was called at that time and the patient was brought in 12/07/2012 as a code stroke. While in the CT symptoms improved somewhat but patient did not return to baseline. BP was markedly elevated. Patient reported being noncompliant with his antihypertensive and ASA for 9 months. He received IV TPA.   Subjective/Complaints: Cough, clear sputum no SOB or CP no sweats or chills Pt feels he is not drinking enough Review of Systems  Constitutional: Negative.   HENT: Positive for congestion.   Eyes: Negative.   Respiratory: Positive for cough.   Cardiovascular: Negative.   Gastrointestinal: Negative.   Genitourinary: Negative.   Musculoskeletal: Negative.   Skin: Negative.   Neurological: Positive for sensory change and focal weakness.  Endo/Heme/Allergies: Negative.   Psychiatric/Behavioral: Negative.    Objective: Vital Signs: Blood pressure 138/93, pulse 65, temperature 98.2 F (36.8 C), temperature source Oral, resp. rate 18, height 6\' 1"  (1.854 m), weight 147.328 kg (324 lb 12.8 oz), SpO2 99.00%. No results found. Results for orders placed during the hospital encounter of 12/09/12 (from the past 72 hour(s))  CREATININE, SERUM     Status: Abnormal   Collection Time   12/16/12  5:42 AM      Component Value Range Comment   Creatinine, Ser 2.56 (*) 0.50 - 1.35 mg/dL    GFR calc non Af Amer 32 (*) >90 mL/min    GFR calc Af Amer 37 (*) >90 mL/min      HEENT: normal. Physical Exam  Constitutional: He is oriented to person, place, and time. He appears well-developed and well-nourished.  HENT:  Head: Normocephalic and atraumatic.  Right Ear: External ear normal.  Left Ear: External ear normal.    Nose: Nose normal.  Eyes: Conjunctivae normal and EOM are normal. Pupils are equal, round, and reactive to light.  Neck: Normal range of motion. Neck supple.  Cardiovascular: An irregularly irregular rhythm present. Exam reveals no gallop.   No murmur heard. Pulmonary/Chest: Effort normal and breath sounds normal.  Abdominal: Soft. Bowel sounds are normal. He exhibits no distension.  Musculoskeletal: He exhibits edema. He exhibits no tenderness.  Neurological: He is alert and oriented to person, place, and time. He has normal reflexes.  Skin: Skin is warm and dry.  Psychiatric: He has a normal mood and affect. His behavior is normal. Judgment and thought content normal.  3/5 Left deltoid biceps, triceps and 3/5 grip, 4/5 L HF, KE and ankle DF/PF 5/5 on R side Sensation mildly reduced L Hand Assessment/Plan: 1. 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 Physiatrist is providing close team supervision and 24 hour management of active medical problems listed below. Physiatrist and rehab team continue to assess barriers to discharge/monitor patient progress toward functional and medical goals. FIM: FIM - Bathing Bathing Steps Patient Completed: Chest;Right Arm;Left Arm;Abdomen;Buttocks;Front perineal area;Left lower leg (including foot);Right upper leg;Left upper leg;Right lower leg (including foot) Bathing: 5: Supervision: Safety issues/verbal cues  FIM - Upper Body Dressing/Undressing Upper body dressing/undressing steps patient completed: Thread/unthread left sleeve of pullover shirt/dress;Put head through opening of pull over shirt/dress;Pull shirt over trunk;Thread/unthread right sleeve  of pullover shirt/dresss Upper body dressing/undressing: 4: Min-Patient completed 75 plus % of tasks FIM - Lower Body Dressing/Undressing Lower body dressing/undressing steps patient  completed: Thread/unthread right underwear leg;Thread/unthread left underwear leg;Pull underwear up/down;Thread/unthread left pants leg;Thread/unthread right pants leg;Don/Doff left sock;Don/Doff right sock;Pull pants up/down Lower body dressing/undressing: 4: Min-Patient completed 75 plus % of tasks  FIM - Toileting Toileting steps completed by patient: Adjust clothing prior to toileting;Performs perineal hygiene;Adjust clothing after toileting Toileting: 5: Supervision: Safety issues/verbal cues  FIM - Archivist Transfers Assistive Devices: Grab bars Toilet Transfers: 5-To toilet/BSC: Supervision (verbal cues/safety issues);5-From toilet/BSC: Supervision (verbal cues/safety issues)  FIM - Press photographer Assistive Devices: Arm rests Bed/Chair Transfer: 6: Supine > Sit: No assist;6: Sit > Supine: No assist;6: Bed > Chair or W/C: No assist;6: Chair or W/C > Bed: No assist  FIM - Locomotion: Wheelchair Distance: 0 Locomotion: Wheelchair: 0: Activity did not occur FIM - Locomotion: Ambulation Ambulation/Gait Assistance: 5: Supervision Locomotion: Ambulation: 5: Travels 150 ft or more with supervision/safety issues  Comprehension Comprehension Mode: Auditory Comprehension: 5-Understands complex 90% of the time/Cues < 10% of the time  Expression Expression Mode: Verbal Expression: 5-Expresses complex 90% of the time/cues < 10% of the time  Social Interaction Social Interaction: 6-Interacts appropriately with others with medication or extra time (anti-anxiety, antidepressant).  Problem Solving Problem Solving: 5-Solves complex 90% of the time/cues < 10% of the time  Memory Memory: 5-Recognizes or recalls 90% of the time/requires cueing < 10% of the time  2. Anticoagulation/DVT prophylaxis with Pharmaceutical: Xarelto 3. Pain Management: tylenol  Medical Problem List and Plan:  1. DVT Prophylaxis/Anticoagulation: Pharmaceutical:Xarelto, also  for secondary stroke prevention  2. Pain Management: N/A  3. Mood: Seems to have a good outlook.  4. Neuropsych: This patient is capable of making decisions on his/her own behalf.  5 HTN: uncontrolled Monitor with bid checks. Continue coreg, Cardizem, Avapro and HCTZ. Added cardura 1/13, BP better this am Continue K+ supplement as patient hypokalemic and is on diuretic. Recheck labs in am.  6. Atrial Fibrillation: monitor HR with bid check. Continue coreg and cardizem for HR control. Continue xarelto.  7. Morbid obesity: Will have dietician educate on heart healthy diet.  8. Acute on chronic renal failure: baseline Cr was 1.35 in 9/11 and 1.6 at admission. Reduce HCTZ dose and aldactone, IVF tonite recheck BMET in am LOS (Days) 7 A FACE TO FACE EVALUATION WAS PERFORMED  KIRSTEINS,ANDREW E 12/16/2012, 7:52 AM

## 2012-12-16 NOTE — Progress Notes (Signed)
Reviewed and agree with the attached treatment note.  Hilari Wethington, OTR/L 

## 2012-12-16 NOTE — Progress Notes (Signed)
Occupational Therapy Session Note  Patient Details  Name: Thomas [name] MRN: [id number] Date of Birth: 14-May-1981  Today's Date: 12/16/2012 Time: 0865-7846 Time Calculation (min): 55 min  Short Term Goals: Week 1:  OT Short Term Goal 1 (Week 1): STG = LTGs due to short ELOS  Skilled Therapeutic Interventions/Progress Updates:   Pt on commode upon arrival. Pt bathed with distant supervision, standing for most. Pt dressed LB in bathroom, UB on EOB. Pt stood at [employer] for visual cues and feedback with clothing management. L side of clothing not pulled up all the way/twisted, with pt fixing once looking in mirror. Increased time for donning socks and shoes at EOB. Standing dynamic balance activity with pt reaching for different objects with LUE and placing in basin. Verbal cues to extend wrist before grasping onto objects. Gross grasp has increased since last week with pt able to hold onto toothpaste, deodorant, lotion bottles.    Therapy Documentation Precautions:  Precautions Precautions: Fall Restrictions Weight Bearing Restrictions: No General: General Amount of Missed OT Time (min): 5 Minutes Vital Signs:   Pain: Pt reports no pain   ADL: ADL Grooming: Minimal assistance Where Assessed-Grooming: Standing at sink Upper Body Bathing: Minimal assistance Where Assessed-Upper Body Bathing: Shower Lower Body Bathing: Minimal assistance Where Assessed-Lower Body Bathing: Shower Upper Body Dressing: Moderate assistance Where Assessed-Upper Body Dressing: Chair Lower Body Dressing: Moderate assistance Where Assessed-Lower Body Dressing: Chair Toileting: Minimal assistance Where Assessed-Toileting: Teacher, adult education: Curator Method: Proofreader: Acupuncturist: Insurance underwriter Method: Designer, industrial/product: Transfer tub bench;Grab bars      See FIM for current  functional status  Therapy/Group: Individual Therapy  [name] [name] 12/16/2012, 12:29 PM

## 2012-12-16 NOTE — Progress Notes (Signed)
Social Work Patient ID: Thomas [name], male   DOB: October 21, 1981, 32 y.o.   MRN: [id number] Met with pt to inform of team conference goals-mod/i level and discharge tomorrow after therapies.  He is pleased with this and will contact Mom to come back tomorrow. Discussed follow up therapies and he has no DME needs.  Will work toward discharge tomorrow.

## 2012-12-16 NOTE — Patient Care Conference (Signed)
Inpatient RehabilitationTeam Conference and Plan of Care Update Date: 12/16/2012   Time: 10:30 AM    Patient Name: Thomas [name]      Medical Record Number: [id number]  Date of Birth: 1981/10/26 Sex: Male         Room/Bed: 4029/4029-01 Payor Info: Payor: [employer]  Plan: [employer]  Product Type: *No Product type*     Admitting Diagnosis: RT MCA infarct   Admit Date/Time:  12/09/2012  3:57 PM Admission Comments: No comment available   Primary Diagnosis:  CVA (cerebral infarction) Principal Problem: CVA (cerebral infarction)  Patient Active Problem List   Diagnosis Date Noted  . Morbid obesity with body mass index of 40.0-44.9 in adult 12/09/2012  . CVA (cerebral infarction) 12/09/2012  . Hemiplegia affecting dominant side 12/09/2012  . Noncompliance with medications 12/09/2012  . Atrial fibrillation with RVR 12/08/2012  . 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 Hypokalemia 12/08/2012  . Cerebral embolism with cerebral infarction 12/07/2012  . Hemiplegia, unspecified, affecting nondominant side 12/07/2012  . CARDIOMYOPATHY 11/28/2010  . RENAL INSUFFICIENCY 11/28/2010  . PALPITATIONS 11/28/2010  . Atrial fibrillation 10/29/2010  . Overweight 08/23/2010  . HYPERTENSION, BENIGN ESSENTIAL, UNCONTROLLED 12/15/2008  . 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 VENTRICULAR HYPERTROPHY, LEFT 12/15/2008  . HEMATURIA UNSPECIFIED 12/15/2008    Expected Discharge Date: Expected Discharge Date: 12/17/12  Team Members Present: Physician leading conference: Dr. Claudette Laws Social Worker Present: Dossie Der, LCSW Nurse Present: Carlean Purl, RN PT Present: Edman Circle, PT;Becky Henrene Dodge, PT;Other (comment) Clarisse Gouge Ripa-PT) OT Present: Bretta Bang, Verlene Mayer, OT SLP Present: Fae Pippin, SLP Other (Discipline and Name): Vernona Rieger Jobe-Dietician     Current Status/Progress Goal Weekly Team Focus  Medical   dehydrated   normal hydration  adjust  meds, increase po   Bowel/Bladder   cont of bowel and bladder  remain cont of bowel/ bladder  remain cont of bowel/bladder   Swallow/Nutrition/ Hydration             ADL's   Supervision bathing and functional transfers with AE; min assist - verbal cues with dressing for hemi-technique secondary to absent UE sensation and ?perceptual deficits  Independent - Mod I  LUE use in functional tasks (grasp/release, sustained grasp), IADL tasks, family education   Mobility   supervision-mod I  Independent  higher level gait, balance training, LUE function, community outing   Communication             Safety/Cognition/ Behavioral Observations            Pain   denied pain/ acetaminophen 325-650 mg  less or equal to 2  less or equal to 2   Skin   N/A  free of skin breakdown  free of skin breakdown      *See Interdisciplinary Assessment and Plan and progress notes for long and short-term goals  Barriers to Discharge: Acute renal failure    Possible Resolutions to Barriers:  IVF    Discharge Planning/Teaching Needs:  Home with Mom coming to stay with him for a short time.  PCP arranged and appt made for follow up.      Team Discussion:  Reaching goals and preparing for discharge.  Outing tomorrow-to work on higher level balance issues. IV fluids tonight-still requires cues to left side due to decreased sensation in his left arm.  Revisions to Treatment Plan:  None   Continued Need for Acute Rehabilitation Level of Care: The patient requires daily medical management by a physician with  specialized training in physical medicine and rehabilitation for the following conditions: Daily direction of a multidisciplinary physical rehabilitation program to ensure safe treatment while eliciting the highest outcome that is of practical value to the patient.: Yes Daily medical management of patient stability for increased activity during participation in an intensive rehabilitation regime.:  Yes Daily analysis of laboratory values and/or radiology reports with any subsequent need for medication adjustment of medical intervention for : Neurological problems  Coutney Wildermuth, Lemar Livings 12/16/2012, 2:24 PM

## 2012-12-17 ENCOUNTER — Encounter (HOSPITAL_COMMUNITY): Payer: 59 | Admitting: *Deleted

## 2012-12-17 ENCOUNTER — Inpatient Hospital Stay (HOSPITAL_COMMUNITY): Payer: 59 | Admitting: Occupational Therapy

## 2012-12-17 ENCOUNTER — Inpatient Hospital Stay (HOSPITAL_COMMUNITY): Payer: 59

## 2012-12-17 ENCOUNTER — Inpatient Hospital Stay (HOSPITAL_COMMUNITY): Payer: 59 | Admitting: *Deleted

## 2012-12-17 DIAGNOSIS — I6340 Cerebral infarction due to embolism of unspecified cerebral artery: Secondary | ICD-10-CM

## 2012-12-17 DIAGNOSIS — G8110 Spastic hemiplegia affecting unspecified side: Secondary | ICD-10-CM

## 2012-12-17 DIAGNOSIS — I4891 Unspecified atrial fibrillation: Secondary | ICD-10-CM

## 2012-12-17 LAB — URINALYSIS, ROUTINE W REFLEX MICROSCOPIC
Hgb urine dipstick: NEGATIVE
Nitrite: NEGATIVE
Protein, ur: NEGATIVE mg/dL
Specific Gravity, Urine: 1.02 (ref 1.005–1.030)
Urobilinogen, UA: 1 mg/dL (ref 0.0–1.0)

## 2012-12-17 LAB — BASIC METABOLIC PANEL
Calcium: 10.1 mg/dL (ref 8.4–10.5)
GFR calc Af Amer: 38 mL/min — ABNORMAL LOW (ref >90)
GFR calc non Af Amer: 33 mL/min — ABNORMAL LOW (ref >90)
Glucose, Bld: 85 mg/dL (ref 70–99)
Potassium: 4.5 mEq/L (ref 3.5–5.1)
Sodium: 138 mEq/L (ref 135–145)

## 2012-12-17 MED ORDER — SODIUM CHLORIDE 0.45 % IV SOLN
INTRAVENOUS | Status: DC
  Administered 2012-12-17 (×2): via INTRAVENOUS

## 2012-12-17 NOTE — Progress Notes (Signed)
Patient ID: Thomas [name], male   DOB: 06/15/1981, 32 y.o.   MRN: [id number] Thomas [name] is an 32 y.o. male with a history of hypertension and atrial fibrillation who was at home and went to the bathroom with no complaints. While in the bathroom developed slurred speech and left sided weakness. EMS was called at that time and the patient was brought in 12/07/2012 as a code stroke. While in the CT symptoms improved somewhat but patient did not return to baseline. BP was markedly elevated. Patient reported being noncompliant with his antihypertensive and ASA for 9 months. He received IV TPA.   Subjective/Complaints: Cough, clear sputum no SOB or CP no sweats or chills Urinated a lot with IVF last noc Review of Systems  Constitutional: Negative.   HENT: Positive for congestion.   Eyes: Negative.   Respiratory: Positive for cough.   Cardiovascular: Negative.   Gastrointestinal: Negative.   Genitourinary: Negative.   Musculoskeletal: Negative.   Skin: Negative.   Neurological: Positive for sensory change and focal weakness.  Endo/Heme/Allergies: Negative.   Psychiatric/Behavioral: Negative.    Objective: Vital Signs: Blood pressure 133/96, pulse 56, temperature 98 F (36.7 C), temperature source Oral, resp. rate 18, height 6\' 1"  (1.854 m), weight 143.3 kg (315 lb 14.7 oz), SpO2 95.00%. No results found. Results for orders placed during the hospital encounter of 12/09/12 (from the past 72 hour(s))  CREATININE, SERUM     Status: Abnormal   Collection Time   12/16/12  5:42 AM      Component Value Range Comment   Creatinine, Ser 2.56 (*) 0.50 - 1.35 mg/dL    GFR calc non Af Amer 32 (*) >90 mL/min    GFR calc Af Amer 37 (*) >90 mL/min      HEENT: normal. Physical Exam  Constitutional: He is oriented to person, place, and time. He appears well-developed and well-nourished.  HENT:  Head: Normocephalic and atraumatic.  Right Ear: External ear normal.  Left Ear: External ear normal.  Nose:  Nose normal.  Eyes: Conjunctivae normal and EOM are normal. Pupils are equal, round, and reactive to light.  Neck: Normal range of motion. Neck supple.  Cardiovascular: An irregularly irregular rhythm present. Exam reveals no gallop.   No murmur heard. Pulmonary/Chest: Effort normal and breath sounds normal.  Abdominal: Soft. Bowel sounds are normal. He exhibits no distension.  Musculoskeletal: He exhibits edema. He exhibits no tenderness.  Neurological: He is alert and oriented to person, place, and time. He has normal reflexes.  Skin: Skin is warm and dry.  Psychiatric: He has a normal mood and affect. His behavior is normal. Judgment and thought content normal.  3/5 Left deltoid biceps, triceps and 3/5 grip, 4/5 L HF, KE and ankle DF/PF 5/5 on R side Sensation mildly reduced L Hand Assessment/Plan: 1. Functional deficits secondary to R MCA distribution infarct also with acute on chronic renal failure if recheck BUN/Creat improved then ok for home if not will get renal consult and more IVF FIM: FIM - Bathing Bathing Steps Patient Completed: Chest;Right Arm;Left Arm;Abdomen;Front perineal area;Buttocks;Right upper leg;Left upper leg;Right lower leg (including foot);Left lower leg (including foot) Bathing: 5: Supervision: Safety issues/verbal cues  FIM - Upper Body Dressing/Undressing Upper body dressing/undressing steps patient completed: Thread/unthread right sleeve of pullover shirt/dresss;Thread/unthread left sleeve of pullover shirt/dress;Put head through opening of pull over shirt/dress;Pull shirt over trunk Upper body dressing/undressing: 6: More than reasonable amount of time FIM - Lower Body Dressing/Undressing Lower body dressing/undressing steps patient completed: Thread/unthread  right underwear leg;Thread/unthread left underwear leg;Pull underwear up/down;Thread/unthread right pants leg;Thread/unthread left pants leg;Pull pants up/down;Don/Doff right sock;Don/Doff left  sock;Don/Doff right shoe;Don/Doff left shoe Lower body dressing/undressing: 5: Supervision: Safety issues/verbal cues  FIM - Toileting Toileting steps completed by patient: Adjust clothing prior to toileting;Performs perineal hygiene;Adjust clothing after toileting Toileting: 7: Independent: No helper, no device  FIM - Diplomatic Services operational officer Devices: Therapist, music Transfers: 7-Independent: No helper  FIM - Banker Devices: Arm rests Bed/Chair Transfer: 7: Independent: No helper  FIM - Locomotion: Wheelchair Distance: 0 Locomotion: Wheelchair: 0: Activity did not occur FIM - Locomotion: Ambulation Ambulation/Gait Assistance: 7: Independent Locomotion: Ambulation: 7: Travels 150 ft or more independently  Comprehension Comprehension Mode: Auditory Comprehension: 6-Follows complex conversation/direction: With extra time/assistive device  Expression Expression Mode: Verbal Expression: 6-Expresses complex ideas: With extra time/assistive device  Social Interaction Social Interaction: 7-Interacts appropriately with others - No medications needed.  Problem Solving Problem Solving: 5-Solves complex 90% of the time/cues < 10% of the time  Memory Memory: 6-More than reasonable amt of time  2. Anticoagulation/DVT prophylaxis with Pharmaceutical: Xarelto 3. Pain Management: tylenol  Medical Problem List and Plan:  1. DVT Prophylaxis/Anticoagulation: Pharmaceutical:Xarelto, also for secondary stroke prevention  2. Pain Management: N/A  3. Mood: Seems to have a good outlook.  4. Neuropsych: This patient is capable of making decisions on his/her own behalf.  5 HTN: uncontrolled Monitor with bid checks. Continue coreg, Cardizem, Avapro and HCTZ. Added cardura 1/13, BP better this am Continue K+ supplement as patient hypokalemic and is on diuretic. Recheck labs in am.  6. Atrial Fibrillation: monitor HR with bid check.  Continue coreg and cardizem for HR control. Continue xarelto.  7. Morbid obesity: Will have dietician educate on heart healthy diet.  8. Acute on chronic renal failure: baseline Cr was 1.35 in 9/11 and 1.6 at admission. Reduce HCTZ dose and aldactone,recheck BMET today LOS (Days) 8 A FACE TO FACE EVALUATION WAS PERFORMED  KIRSTEINS,ANDREW E 12/17/2012, 7:26 AM

## 2012-12-17 NOTE — Consult Note (Addendum)
[facility] Consult Note    Date: 12/17/2012                  Patient Name:  Jlynn Langille  MRN: [id number]  DOB: [birth year]  Age / Sex: 32 y.o., male         PCP: No primary provider on file.                 Service Requesting Consult: Inpatient Rehab                 Reason for Consult: Elevated Creatinine            History of Present Illness: Patient is a 32 y.o. male with a PMHx of HTN, atrial fib, non compliant with medications, who was admitted to [facility] on 12/09/2012 for evaluation of slurred speech and left sided weakness, found to have an acute Right MCA stroke, for which he received TPA. His blood pressure on admission was 213/132 and he was in a-fib with RVR. He was placed on blood pressure control and rate control medications as below, started on 12/10/12. Creatinine was noticed to increase from his baseline and nephrology was consulted.    Patient denies any history of kidney disease. He denies any abdominal pain, nausea, vomiting, dysuria, but does report some polyuria at home off of BP medication. He denies any significant lower extremity swelling. He does endorse some increased shortness of breath with walking 1 block, for 4 months. He reports taking occasional NSAID's (less than 2 in 3 months). He had been off of his BP medications for about 9 months. Upon review of the chart, it appears that in 2010, he had an increase in his creatinine from 1.15 to 1.58 with lisinopril at which point it was held.     Medications: Outpatient medications: No prescriptions prior to admission    Current medications: Current Facility-Administered Medications  Medication Dose Route Frequency Provider Last Rate Last Dose  . 0.45 % sodium chloride infusion   Intravenous Continuous Erick Colace, MD 75 mL/hr at 12/17/12 0907    . acetaminophen (TYLENOL) tablet 325-650 mg  325-650 mg Oral Q4H PRN Jacquelynn Cree, PA   650 mg at 12/10/12 1726  . alum & mag hydroxide-simeth  (MAALOX/MYLANTA) 200-200-20 MG/5ML suspension 30 mL  30 mL Oral Q4H PRN Jacquelynn Cree, PA      . bisacodyl (DULCOLAX) suppository 10 mg  10 mg Rectal Daily PRN Jacquelynn Cree, PA      . carvedilol (COREG) tablet 12.5 mg  12.5 mg Oral BID WC Erick Colace, MD   12.5 mg at 12/17/12 0831  . cloNIDine (CATAPRES) tablet 0.1 mg  0.1 mg Oral Q2H PRN Jacquelynn Cree, PA   0.1 mg at 12/13/12 1921  . diltiazem (CARDIZEM CD) 24 hr capsule 240 mg  240 mg Oral Daily Jacquelynn Cree, [place]   240 mg at 12/17/12 0831  . diphenhydrAMINE (BENADRYL) 12.5 MG/5ML elixir 12.5-25 mg  12.5-25 mg Oral Q6H PRN Jacquelynn Cree, PA      . doxazosin (CARDURA) tablet 1 mg  1 mg Oral Daily Erick Colace, MD   1 mg at 12/17/12 0831  . guaiFENesin-dextromethorphan (ROBITUSSIN DM) 100-10 MG/5ML syrup 5-10 mL  5-10 mL Oral Q6H PRN Jacquelynn Cree, PA      . hydrochlorothiazide (MICROZIDE) capsule 12.5 mg  12.5 mg Oral Daily Erick Colace, MD   12.5 mg at 12/17/12 0831  .  irbesartan (AVAPRO) tablet 300 mg  300 mg Oral Daily Evlyn Kanner Love, PA   300 mg at 12/17/12 0831  . potassium chloride (K-DUR,KLOR-CON) CR tablet 10 mEq  10 mEq Oral Daily Jacquelynn Cree, PA   10 mEq at 12/17/12 0831  . prochlorperazine (COMPAZINE) tablet 5-10 mg  5-10 mg Oral Q6H PRN Jacquelynn Cree, PA       Or  . prochlorperazine (COMPAZINE) injection 5-10 mg  5-10 mg Intramuscular Q6H PRN Jacquelynn Cree, PA       Or  . prochlorperazine (COMPAZINE) suppository 12.5 mg  12.5 mg Rectal Q6H PRN Jacquelynn Cree, PA      . Rivaroxaban (XARELTO) tablet 20 mg  20 mg Oral Q supper Ranelle Oyster, MD   20 mg at 12/16/12 1755  . senna-docusate (Senokot-S) tablet 2 tablet  2 tablet Oral QHS PRN Jacquelynn Cree, PA      . spironolactone (ALDACTONE) tablet 25 mg  25 mg Oral Daily Erick Colace, MD   25 mg at 12/17/12 0831  . traZODone (DESYREL) tablet 25-50 mg  25-50 mg Oral QHS PRN Jacquelynn Cree, PA          Allergies: No Known Allergies    Past Medical  History: Past Medical History  Diagnosis Date  . Cardiomyopathy 11/28/2010  . Palpitation 11/28/2010  . Atrial fibrillation 10/29/2010  . Benign essential hypertension 12/15/2008  . Paroxysmal SVT (supraventricular tachycardia) 12/15/2008  . 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 Renal insufficiency 11/28/2010  . Overweight 08/23/2010  . Hematuria, unspecified 12/15/2008     Past Surgical History: No past surgical history on file.   Family History: Family History  Problem Relation Age of Onset  . Hypertension Mother   . Hypertension Father  3 sisters-no High BP  . Diabetes Mother   . Glaucoma Father      Social History: History   Social History  . Marital Status: Single    Spouse Name: N/A    Number of Children: N/A  . Years of Education: N/A   Occupational History  . Behavioral Health    Social History Main Topics  . Smoking status: Never Smoker   . Smokeless tobacco: Not on file  . Alcohol Use: Yes     Comment: <1/day  . Drug Use: No  . Sexually Active:    Other Topics Concern  . Not on file   Social History Narrative   Born in [place], HS gard.  Attended [employer] and [employer] (studying graphi9c communication)--hasn't finished degree.  [place] [employer]--served in [place], [place] Lives alone.  Not married.  Works at [employer] as [employer].         Review of Systems: Occ palpitations, occ sweating  Vital Signs: Blood pressure 133/96, pulse 56, temperature 98 F (36.7 C), temperature source Oral, resp. rate 18, height 6\' 1"  (1.854 m), weight 315 lb 14.7 oz (143.3 kg), SpO2 95.00%.  Weight trends: Filed Weights   12/09/12 1610 12/16/12 1300  Weight: 324 lb 12.8 oz (147.328 kg) 315 lb 14.7 oz (143.3 kg)    Physical Exam: General: Vital signs reviewed and noted. Well-developed,  well-nourished, in no acute distress; alert, appropriate and cooperative throughout examination.  Head: Normocephalic, atraumatic.  Eyes: PERRL, EOMI  Nose: Mucous membranes moist, not inflammed, nonerythematous.  Throat: Oropharynx nonerythematous, no  exudate appreciated.   Neck: No deformities, masses, or tenderness noted.Supple, no JVD appreciated  Lungs:  Normal respiratory effort. Clear to auscultation BL without crackles or wheezes.  Heart: Irregularly irregular S1 and S2 normal without gallop, murmur, or rubs.  Abdomen:  BS normoactive. Soft, Nondistended, non-tender.  No masses or organomegaly.  Extremities: No lower extremity edema.  Neurologic: A&O X3, L handed CN II - XII are grossly intact. Motor strength is 3/5 for left hand grip, 5/5 strength in biceps, triceps and knee extension bilaterally, Sensations intact to light touch,   Skin: No visible rashes, scars.    Lab results: Basic Metabolic Panel:  Lab 12/17/12 4098 12/16/12 0542  NA 138 --  K 4.5 --  CL 100 --  CO2 24 --  GLUCOSE 85 --  BUN 41* --  CREATININE 2.48* 2.56*  CALCIUM 10.1 --  MG -- --  PHOS -- --   CMP     Component Value Date/Time   NA 138 12/17/2012 0700   K 4.5 12/17/2012 0700   CL 100 12/17/2012 0700   CO2 24 12/17/2012 0700   GLUCOSE 85 12/17/2012 0700   BUN 41* 12/17/2012 0700   CREATININE 2.48* 12/17/2012 0700   CALCIUM 10.1 12/17/2012 0700   PROT 7.4 12/10/2012 0555   ALBUMIN 3.5 12/10/2012 0555   AST 28 12/10/2012 0555   ALT 26 12/10/2012 0555   ALKPHOS 43 12/10/2012 0555   BILITOT 0.7 12/10/2012 0555   GFRNONAA 33* 12/17/2012 0700   GFRAA 38* 12/17/2012 0700   CBC    Component Value Date/Time   WBC 6.9 12/10/2012 0555   RBC 5.01 12/10/2012 0555   HGB 14.5 12/10/2012 0555   HCT 42.9 12/10/2012 0555   PLT 172 12/10/2012 0555   MCV 85.6 12/10/2012 0555   MCH 28.9 12/10/2012 0555   MCHC 33.8 12/10/2012 0555   RDW 14.1 12/10/2012 0555   LYMPHSABS 2.5 12/10/2012 0555   MONOABS 0.6 12/10/2012 0555   EOSABS 0.1  12/10/2012 0555   BASOSABS 0.0 12/10/2012 0555    Urinalysis:  Basename 12/17/12 0928  COLORURINE YELLOW  LABSPEC 1.020  PHURINE 5.5  GLUCOSEU NEGATIVE  HGBUR NEGATIVE  BILIRUBINUR NEGATIVE  KETONESUR NEGATIVE  PROTEINUR NEGATIVE  UROBILINOGEN 1.0  NITRITE NEGATIVE  LEUKOCYTESUR NEGATIVE      Imaging: US Renal  12/17/2012  *RADIOLOGY REPORT*  Clinical Data: Renal insufficiency.  RENAL/URINARY TRACT ULTRASOUND COMPLETE  Comparison:  None.  Findings:  Right Kidney:  12.0 cm. No hydronephrosis.  Lower pole renal cyst measuring 2.0 cm.  Suggestion of increased echogenicity.  Left Kidney:  12.0 cm. No hydronephrosis.  Possible mildly increased echogenicity.  Bladder:  Decompressed  IMPRESSION: No hydronephrosis.  Possible increased renal echogenicity, related to medical renal disease.  Right renal cyst.   Original Report Authenticated By: Jeronimo Greaves, M.D.       Assessment & Plan: Pt is a 32 y.o. yo male with a PMHX of HTN, a-fib, was admitted to [facility] on 12/09/2012 with Right MCA stroke with left sided deficits. Renal consulted for evaluation of acute rise in creatinine.   # acute kidney failure: baseline creatinine in 2011 was 1.35. On admission, Creatinine was 1.6 and increased to 2.48 yesterday. Admission creatinine was probably progression of hypertensive nephrosclerosis.  Regarding the more recent Cr increase, Drug induced AKI (ARB vs HCTZ vs aldactone) remains in the differential since patient was started on new BP medications prior to rise in creatinine. However, Cr is a little lower today while  remaining on irbesartan.  US obtained today did not show any evidence of obstruction and suggests a degree of medical renal disease. UA negative for proteinuria or hematuria.    -SPEP/UPEP - in setting of possible underlying chronic kidney disease, obtain PTH, Phos - continue  ARB unless Cr goes higher  # HTN: on coreg, irbesartan, HCTZ and aldactone: may need adjusting of medications in  context of acute renal failure. Consider d/cing ARB if Cr rises.  Check renal artery duplex scan to exclude renal artery stenosis as cause for HIGH BP  # CVA: per primary team, in inpatient rehab # Atrial fibrillation: rate controlled on coreg and cardizem. Anticoagulation with xarelto  DVT PPX - frequent ambulation   Marena Chancy, PGY-2 Family Medicine Resident I spoke with and examined Mr. Creamer with Dr. Gwenlyn Saran.  He has long hx of untreated high BP.  Cr has gone up in association with lowering of BP and irbesartan (A II blocker).  Cr is a LITTLE lower today so I'm inclined to leave him on irbesartan  for now. If Cr higher tomorrow, then it should be d/c'd.   He had 24 hr urine for catechols and metanephrines in Jan 2010.  Catechols were normal.  Metanephrine was 606 (nl < 604) and Normetanephrine 466 (nl < 412) Aldo/renin ratio 5.2 (nl 0.9-29).   He should get renal artery duplex scan tomorrow to exclude RAS as cause for high BP SPEP and UPEP to be done to exclude myeloma (I doubt he has this).   Renal profile and PTH tomorrow Save R arm for vascular access in future D/C IVF

## 2012-12-17 NOTE — Progress Notes (Signed)
Occupational Therapy Discharge Summary  Patient Details  Name: [name] MRN: [id number] Date of Birth: [birth year]  Today's Date: 12/17/2012  Patient has met 8 of 9 long term goals due to improved activity tolerance, improved balance, postural control, ability to compensate for deficits, functional use of  LEFT upper extremity and improved awareness.  Patient to discharge at overall Modified Independent level.  Patient's care partner is independent to provide the necessary assistance at discharge, patient's mother plans to stay with pt in his home for an undetermined amount of time to allow for increased safety and independence during this transition period.  Reasons goals not met: Pt requires min cues for attention to LUE with functional tasks secondary to impaired sensation and decreased proprioception which negatively effects his ability to carry out self-care tasks.   Recommendation:  Patient will benefit from ongoing skilled OT services in outpatient setting to continue to advance functional skills in the area of BADL and iADL as well as coordination and functional use of dominant LUE in functional tasks.  Equipment: shower chair  Reasons for discharge: treatment goals met and discharge from hospital  Patient/family agrees with progress made and goals achieved: Yes  OT Discharge Precautions/Restrictions  Precautions Precautions: None Restrictions Weight Bearing Restrictions: No Pain Pain Assessment Pain Assessment: No/denies pain Pain Score: 0-No pain ADL ADL Eating: Modified independent Where Assessed-Eating: Chair Grooming: Independent Where Assessed-Grooming: Standing at sink Upper Body Bathing: Modified independent Where Assessed-Upper Body Bathing: Shower Lower Body Bathing: Modified independent Where Assessed-Lower Body Bathing: Shower Upper Body Dressing: Modified independent (Device) Where Assessed-Upper Body Dressing: Chair Lower Body Dressing: Modified  independent Where Assessed-Lower Body Dressing: Chair Toileting: Independent Where Assessed-Toileting: Teacher, adult education: Community education officer Method: Proofreader: Chiropractor Transfer: Modified independent Web designer Method: Ship broker: Information systems manager with back Film/video editor: Modified independent Film/video editor Method: Designer, industrial/product: Information systems manager with back Vision/Perception  Vision - Assessment Eye Alignment: Within Functional Limits  Cognition  Pt's cognition intact, however requires increased time for processing and demonstrates difficulty with problem solving and attention to tasks with increased challenge. Sensation Sensation Light Touch: Impaired Detail (absent in LUE) Light Touch Impaired Details: Absent LUE Stereognosis: Impaired Detail Stereognosis Impaired Details: Absent LUE Hot/Cold: Impaired Detail Hot/Cold Impaired Details: Impaired LUE Proprioception: Impaired Detail Proprioception Impaired Details: Impaired LUE Coordination Gross Motor Movements are Fluid and Coordinated: No Fine Motor Movements are Fluid and Coordinated: No Finger Nose Finger Test: dysmetria and undershooting with LUE Balance  56/56 on BERG Extremity/Trunk Assessment RUE Assessment RUE Assessment: Within Functional Limits LUE Assessment LUE Assessment: Exceptions to WFL LUE AROM (degrees) LUE Overall AROM Comments: full ROM with gravity decreased, limited shoulder mobility; elbow, wrist, and fingers WNL however require increased time and effort  LUE Strength Left Shoulder Flexion: 3+/5 Left Elbow Flexion: 4/5 Left Elbow Extension: 4/5 Gross Grasp: Impaired  See FIM for current functional status  Leonette Monarch 12/17/2012, 2:52 PM

## 2012-12-17 NOTE — Progress Notes (Signed)
Recreational Therapy Session Note  Patient Details  Name: Aristotelis Vilardi MRN: [id number] Date of Birth: [birth year] Today's Date: 12/17/2012  Community reintegration/outing cancelled today due to renal concerns and need for IV fluids.  Pt missed 120 minutes of scheduled therapy during outing. Nakyiah Kuck 12/17/2012, 4:00 PM

## 2012-12-17 NOTE — Progress Notes (Signed)
Occupational Therapy Session Note  Patient Details  Name: [name] MRN: [id number] Date of Birth: January 02, 1981  Today's Date: 12/17/2012 Time: 0732-0832 Time Calculation (min): 60 min  Short Term Goals: Week 1:  OT Short Term Goal 1 (Week 1): STG = LTGs due to short ELOS  Skilled Therapeutic Interventions/Progress Updates:    Pt seen for ADL retraining with pt completing ambulation, gathering all necessary items for bathing and dressing, showering at independent level.  However pt completed dressing task with donning pants backwards and shirt inside out, independently used visual cues (mirror) to assess status when noted pants on backwards and shirt inside out.  Pt reports plans to leave sweatpants backwards, but will fix shirt.  Pt will require increased time for dressing secondary to decreased perceptual skills.    Therapy Documentation Precautions:  Precautions Precautions: Fall Restrictions Weight Bearing Restrictions: No General:   Vital Signs: Therapy Vitals Temp: 98 F (36.7 C) Temp src: Oral Pulse Rate: 56  Resp: 18  BP: 133/96 mmHg Oxygen Therapy SpO2: 95 % O2 Device: None (Room air) Pain:  Pt with no c/o pain this session.  See FIM for current functional status  Therapy/Group: Individual Therapy  Leonette Monarch 12/17/2012, 9:04 AM

## 2012-12-17 NOTE — Progress Notes (Signed)
Recreational Therapy Discharge Summary Patient Details  Name: [name] MRN: [id number] Date of Birth: 06-Jan-1981 Today's Date: 12/17/2012  Long term goals set: 2  Long term goals met: 1  Comments on progress toward goals: Pt has made good progress towards goals and was scheduled for discharge home today with family at Mod I level.  Pt continues to be limited by decreased activity tolerance, LUE use, & attention to the left. Reasons goals not met: Community reintegration/outing cancelled due to renal issues, but pt educated on safety concerns and participated in community ambulation during PT sessions while on CIR.  Patient/family agrees with progress made and goals achieved: Yes  [name] 12/17/2012, 4:02 PM

## 2012-12-17 NOTE — Plan of Care (Signed)
Problem: RH Functional Use of Upper Extremity Goal: LTG Patient will use RT/LT upper extremity as a (OT) LTG: Patient will use right/left upper extremity as a stabilizer/gross assist/diminished/nondominant/dominant level with assist, with/without cues during functional activity (OT)  Outcome: Not Met (add Reason) Pt requires cues for attention to LUE with functional tasks secondary to impaired sensation and decreased proprioception.

## 2012-12-18 DIAGNOSIS — I10 Essential (primary) hypertension: Secondary | ICD-10-CM

## 2012-12-18 LAB — URINE CULTURE: Culture: NO GROWTH

## 2012-12-18 LAB — RENAL FUNCTION PANEL
CO2: 24 mEq/L (ref 19–32)
Chloride: 99 mEq/L (ref 96–112)
GFR calc Af Amer: 39 mL/min — ABNORMAL LOW (ref >90)
GFR calc non Af Amer: 34 mL/min — ABNORMAL LOW (ref >90)
Glucose, Bld: 82 mg/dL (ref 70–99)
Potassium: 4.4 mEq/L (ref 3.5–5.1)
Sodium: 135 mEq/L (ref 135–145)

## 2012-12-18 MED ORDER — CARVEDILOL 12.5 MG PO TABS: 12.5000 mg | ORAL_TABLET | Freq: Two times a day (BID) | ORAL | Status: DC

## 2012-12-18 MED ORDER — RIVAROXABAN 20 MG PO TABS: 20.0000 mg | ORAL_TABLET | Freq: Every day | ORAL | Status: DC

## 2012-12-18 MED ORDER — SIMETHICONE 80 MG PO CHEW
80.0000 mg | CHEWABLE_TABLET | Freq: Four times a day (QID) | ORAL | Status: DC | PRN
  Administered 2012-12-18: 80 mg via ORAL
  Filled 2012-12-18: qty 1

## 2012-12-18 MED ORDER — SIMETHICONE 40 MG/0.6ML PO SUSP
80.0000 mg | Freq: Four times a day (QID) | ORAL | Status: DC | PRN
Start: 2012-12-18 — End: 2012-12-18

## 2012-12-18 MED ORDER — DILTIAZEM HCL ER COATED BEADS 240 MG PO CP24: 240.0000 mg | ORAL_CAPSULE | Freq: Every day | ORAL | Status: DC

## 2012-12-18 MED ORDER — SPIRONOLACTONE 25 MG PO TABS: 25.0000 mg | ORAL_TABLET | Freq: Every day | ORAL | Status: DC

## 2012-12-18 MED ORDER — DOXAZOSIN MESYLATE 1 MG PO TABS: 1.0000 mg | ORAL_TABLET | Freq: Every day | ORAL | Status: DC

## 2012-12-18 MED ORDER — IRBESARTAN 300 MG PO TABS: 300.0000 mg | ORAL_TABLET | Freq: Every day | ORAL | Status: DC

## 2012-12-18 NOTE — Progress Notes (Signed)
Social Work Discharge Note Discharge Note  The overall goal for the admission was met for:   Discharge location: Yes-HOME WITH MOM COMING TO STAY WITH A SHORT TIME  Length of Stay: Yes-10 DAYS  Discharge activity level: Yes-MOD/-I LEVEL  Home/community participation: Yes  Services provided included: MD, RD, PT, OT, SLP, RN, TR, Pharmacy and SW  Financial Services: Private Insurance: [employer]  Follow-up services arranged: Outpatient: [facility] WEDNES[facility] 9;15-12;00 PT,OT,SPT, DME: [facility] and Patient/Family has no preference for HH/DME agencies  Comments (or additional information):  Patient/Family verbalized understanding of follow-up arrangements: Yes  Individual responsible for coordination of the follow-up plan: SELF & MOM  Confirmed correct DME delivered: Lucy Chris 12/18/2012    Lucy Chris

## 2012-12-18 NOTE — Progress Notes (Signed)
[facility]  Subjective:  No acute events overnight. No complaints.    Objective: Vital signs in last 24 hours: Blood pressure 168/98, pulse 60, temperature 98.9 F (37.2 C), temperature source Oral, resp. rate 19, height 6\' 1"  (1.854 m), weight 315 lb 14.7 oz (143.3 kg), SpO2 99.00%.    PHYSICAL EXAM Gen: NAD Neck: no JVD  Chest: Clear breath sounds Cardio:Irregularly irregular rhythm, normal rate, S1, S2 normal, no murmur GI: soft, non-tender; bowel sounds normal; no masses, no organomegaly  Extremities: extremities normal, atraumatic, no edema Pulses: 2+ and symmetric  Neurologic: No focal deficit  Lab Results:   Lab 12/18/12 0625 12/17/12 0700 12/16/12 0542  NA 135 138 --  K 4.4 4.5 --  CL 99 100 --  CO2 24 24 --  BUN 46* 41* --  CREATININE 2.43* 2.48* 2.56*  ALB -- -- --  GLUCOSE 82 -- --  CALCIUM 10.1 10.1 --  PHOS 4.5 -- --    No results found for this basename: WBC:2,HGB:2,HCT:2,PLT:2 in the last 72 hours   Scheduled Meds:   . carvedilol  12.5 mg Oral BID WC  . diltiazem  240 mg Oral Daily  . doxazosin  1 mg Oral Daily  . hydrochlorothiazide  12.5 mg Oral Daily  . irbesartan  300 mg Oral Daily  . potassium chloride  10 mEq Oral Daily  . rivaroxaban  20 mg Oral Q supper  . spironolactone  25 mg Oral Daily   Continuous Infusions:  PRN Meds:.acetaminophen, alum & mag hydroxide-simeth, bisacodyl, cloNIDine, diphenhydrAMINE, guaiFENesin-dextromethorphan, prochlorperazine, prochlorperazine, prochlorperazine, senna-docusate, traZODone  Assessment/Plan: Pt is a 32 y.o. yo male with a PMHX of HTN, a-fib, was [name]2012 with Right MCA stroke with left sided deficits. Renal consulted for evaluation of acute rise in creatinine.   # acute kidney failure: baseline creatinine in 2011 was 1.35. On admission, Creatinine was 1.6 and increased to 2.48 on 12/16/12. Admission creatinine was probably progression of hypertensive  nephrosclerosis. UA without protein or hematuria. [place] overall unremarkable. Creatinine today is 2.43, stable from yesterday at 2.48 and 2.56 the day before. Acute creatinine increase likely from lowering of blood pressure. Continue ARB and continue to monitor Cr trend. If increases or doesn't trend down, will consider d/cing ARB.  -SPEP/UPEP pending - PTH pending,  Ph normal at 4.5.   # HTN: on coreg, irbesartan, HCTZ and aldactone: may need adjusting of medications in context of acute renal failure. Consider d/cing ARB if Cr rises.  -  renal artery duplex scan to exclude renal artery stenosis as cause for HIGH BP to be done today - results for normetanephrine and metanephrine from 2010 were slightly above range but inconclusive. Consider repeat collection.  # CVA: per primary team, in inpatient rehab  # Atrial fibrillation: rate controlled on coreg and cardizem. Anticoagulation with xarelto  DVT PPX - frequent ambulation    LOS: 9 days   [name] 12/18/2012,7:54 AM

## 2012-12-18 NOTE — Progress Notes (Signed)
Patient ID: Thomas [name], male   DOB: 28-Dec-1980, 32 y.o.   MRN: [id number] Thomas [name] is an 32 y.o. male with a history of hypertension and atrial fibrillation who was at home and went to the bathroom with no complaints. While in the bathroom developed slurred speech and left sided weakness. EMS was called at that time and the patient was brought in 12/07/2012 as a code stroke. While in the CT symptoms improved somewhat but patient did not return to baseline. BP was markedly elevated. Patient reported being noncompliant with his antihypertensive and ASA for 9 months. He received IV TPA.   Subjective/Complaints: Cough, clear sputum no SOB or CP no sweats or chills Urinated a lot with IVF last noc Review of Systems  Constitutional: Negative.   HENT: Positive for congestion.   Eyes: Negative.   Respiratory: Positive for cough.   Cardiovascular: Negative.   Gastrointestinal: Negative.   Genitourinary: Negative.   Musculoskeletal: Negative.   Skin: Negative.   Neurological: Positive for sensory change and focal weakness.  Endo/Heme/Allergies: Negative.   Psychiatric/Behavioral: Negative.    Objective: Vital Signs: Blood pressure 168/98, pulse 60, temperature 98.9 F (37.2 C), temperature source Oral, resp. rate 19, height 6\' 1"  (1.854 m), weight 143.3 kg (315 lb 14.7 oz), SpO2 99.00%. US Renal  12/17/2012  *RADIOLOGY REPORT*  Clinical Data: Renal insufficiency.  RENAL/URINARY TRACT ULTRASOUND COMPLETE  Comparison:  None.  Findings:  Right Kidney:  12.0 cm. No hydronephrosis.  Lower pole renal cyst measuring 2.0 cm.  Suggestion of increased echogenicity.  Left Kidney:  12.0 cm. No hydronephrosis.  Possible mildly increased echogenicity.  Bladder:  Decompressed  IMPRESSION: No hydronephrosis.  Possible increased renal echogenicity, related to medical renal disease.  Right renal cyst.   Original Report Authenticated By: Jeronimo Greaves, M.D.    Results for orders placed during the hospital encounter of  12/09/12 (from the past 72 hour(s))  CREATININE, SERUM     Status: Abnormal   Collection Time   12/16/12  5:42 AM      Component Value Range Comment   Creatinine, Ser 2.56 (*) 0.50 - 1.35 mg/dL    GFR calc non Af Amer 32 (*) >90 mL/min    GFR calc Af Amer 37 (*) >90 mL/min   BASIC METABOLIC PANEL     Status: Abnormal   Collection Time   12/17/12  7:00 AM      Component Value Range Comment   Sodium 138  135 - 145 mEq/L    Potassium 4.5  3.5 - 5.1 mEq/L    Chloride 100  96 - 112 mEq/L    CO2 24  19 - 32 mEq/L    Glucose, Bld 85  70 - 99 mg/dL    BUN 41 (*) 6 - 23 mg/dL    Creatinine, Ser 4.09 (*) 0.50 - 1.35 mg/dL    Calcium 81.1  8.4 - 10.5 mg/dL    GFR calc non Af Amer 33 (*) >90 mL/min    GFR calc Af Amer 38 (*) >90 mL/min   URINALYSIS, ROUTINE W REFLEX MICROSCOPIC     Status: Normal   Collection Time   12/17/12  9:28 AM      Component Value Range Comment   Color, Urine YELLOW  YELLOW    APPearance CLEAR  CLEAR    Specific Gravity, Urine 1.020  1.005 - 1.030    pH 5.5  5.0 - 8.0    Glucose, UA NEGATIVE  NEGATIVE mg/dL  Hgb urine dipstick NEGATIVE  NEGATIVE    Bilirubin Urine NEGATIVE  NEGATIVE    Ketones, ur NEGATIVE  NEGATIVE mg/dL    Protein, ur NEGATIVE  NEGATIVE mg/dL    Urobilinogen, UA 1.0  0.0 - 1.0 mg/dL    Nitrite NEGATIVE  NEGATIVE    Leukocytes, UA NEGATIVE  NEGATIVE MICROSCOPIC NOT DONE ON URINES WITH NEGATIVE PROTEIN, BLOOD, LEUKOCYTES, NITRITE, OR GLUCOSE <1000 mg/dL.  RENAL FUNCTION PANEL     Status: Abnormal   Collection Time   12/18/12  6:25 AM      Component Value Range Comment   Sodium 135  135 - 145 mEq/L    Potassium 4.4  3.5 - 5.1 mEq/L    Chloride 99  96 - 112 mEq/L    CO2 24  19 - 32 mEq/L    Glucose, Bld 82  70 - 99 mg/dL    BUN 46 (*) 6 - 23 mg/dL    Creatinine, Ser 1.61 (*) 0.50 - 1.35 mg/dL    Calcium 09.6  8.4 - 10.5 mg/dL    Phosphorus 4.5  2.3 - 4.6 mg/dL    Albumin 3.7  3.5 - 5.2 g/dL    GFR calc non Af Amer 34 (*) >90 mL/min    GFR  calc Af Amer 39 (*) >90 mL/min      HEENT: normal. Physical Exam  Constitutional: He is oriented to person, place, and time. He appears well-developed and well-nourished.  HENT:  Head: Normocephalic and atraumatic.  Right Ear: External ear normal.  Left Ear: External ear normal.  Nose: Nose normal.  Eyes: Conjunctivae normal and EOM are normal. Pupils are equal, round, and reactive to light.  Neck: Normal range of motion. Neck supple.  Cardiovascular: An irregularly irregular rhythm present. Exam reveals no gallop.   No murmur heard. Pulmonary/Chest: Effort normal and breath sounds normal.  Abdominal: Soft. Bowel sounds are normal. He exhibits no distension.  Musculoskeletal: He exhibits edema. He exhibits no tenderness.  Neurological: He is alert and oriented to person, place, and time. He has normal reflexes.  Skin: Skin is warm and dry.  Psychiatric: He has a normal mood and affect. His behavior is normal. Judgment and thought content normal.  3/5 Left deltoid biceps, triceps and 3/5 grip, 4/5 L HF, KE and ankle DF/PF 5/5 on R side Sensation mildly reduced L Hand Assessment/Plan: 1. Functional deficits secondary to R MCA distribution infarct also with acute on chronic renal failure work up continues.  GFR stable but lower than baseline.  For renal artery duplex today.  ? Home in am if ok with renal otherwise will need to be transferred to their service    FIM: FIM - Bathing Bathing Steps Patient Completed: Chest;Right Arm;Left Arm;Abdomen;Front perineal area;Buttocks;Right upper leg;Left upper leg;Right lower leg (including foot);Left lower leg (including foot) Bathing: 7: Complete Independence: No helper  FIM - Upper Body Dressing/Undressing Upper body dressing/undressing steps patient completed: Thread/unthread right sleeve of pullover shirt/dresss;Thread/unthread left sleeve of pullover shirt/dress;Put head through opening of pull over shirt/dress;Pull shirt over trunk Upper  body dressing/undressing: 6: More than reasonable amount of time FIM - Lower Body Dressing/Undressing Lower body dressing/undressing steps patient completed: Thread/unthread right underwear leg;Thread/unthread left underwear leg;Pull underwear up/down;Thread/unthread right pants leg;Thread/unthread left pants leg;Pull pants up/down;Don/Doff right sock;Don/Doff left sock;Don/Doff right shoe;Don/Doff left shoe Lower body dressing/undressing: 6: More than reasonable amount of time  FIM - Toileting Toileting steps completed by patient: Adjust clothing prior to toileting;Performs perineal hygiene;Adjust clothing after  toileting Toileting Assistive Devices: Grab bar or rail for support Toileting: 7: Independent: No helper, no device  FIM - Diplomatic Services operational officer Devices: Therapist, music Transfers: 7-Independent: No helper  FIM - Banker Devices: Arm rests Bed/Chair Transfer: 7: Independent: No helper  FIM - Locomotion: Wheelchair Distance: 0 Locomotion: Wheelchair: 0: Activity did not occur FIM - Locomotion: Ambulation Ambulation/Gait Assistance: 7: Independent Locomotion: Ambulation: 7: Travels 150 ft or more independently  Comprehension Comprehension Mode: Auditory Comprehension: 6-Follows complex conversation/direction: With extra time/assistive device  Expression Expression Mode: Verbal Expression: 6-Expresses complex ideas: With extra time/assistive device  Social Interaction Social Interaction: 7-Interacts appropriately with others - No medications needed.  Problem Solving Problem Solving: 6-Solves complex problems: With extra time  Memory Memory: 6-More than reasonable amt of time  2. Anticoagulation/DVT prophylaxis with Pharmaceutical: Xarelto 3. Pain Management: tylenol  Medical Problem List and Plan:  1. DVT Prophylaxis/Anticoagulation: Pharmaceutical:Xarelto, also for secondary stroke prevention  2. Pain  Management: N/A  3. Mood: Seems to have a good outlook.  4. Neuropsych: This patient is capable of making decisions on his/her own behalf.  5 HTN: uncontrolled Monitor with bid checks. Continue coreg, Cardizem, Avapro and HCTZ. Added cardura 1/13, BP better this am Continue K+ supplement as patient hypokalemic and is on diuretic. Recheck labs in am.  6. Atrial Fibrillation: monitor HR with bid check. Continue coreg and cardizem for HR control. Continue xarelto.  7. Morbid obesity: Will have dietician educate on heart healthy diet.  8. Acute on chronic renal failure: baseline Cr was 1.35 in 9/11 and 1.6 at admission. Reduce HCTZ dose and aldactone,recheck BMET stable, will ask renal about holding avapro and HCTZ today LOS (Days) 9 A FACE TO FACE EVALUATION WAS PERFORMED  Harika Laidlaw E 12/18/2012, 7:43 AM

## 2012-12-18 NOTE — Progress Notes (Signed)
Social Work Patient ID: Thomas [name], male   DOB: Jun 05, 1981, 32 y.o.   MRN: [id number] Pam-PA reports pt will be ready to be discharged tomorrow.  Pt aware and will inform family.  He feels he is ready. OP and DME arranged for his discharge.

## 2012-12-18 NOTE — Progress Notes (Signed)
*  PRELIMINARY RESULTS* Vascular Ultrasound Renal Artery Duplex has been completed. The study was technically difficult due to patient body habitus and excessive bowel gas. There is no obvious evidence of renal artery stenosis bilaterally. Unable to visualize the left proximal renal artery, therefore unable to assess for stenosis. The right kidney demonstrates a simple cyst of the lower pole.  12/18/2012 2:58 PM Gertie Fey, RDMS, RDCS

## 2012-12-18 NOTE — Progress Notes (Signed)
I have personally seen and examined this patient and agree with the assessment/plan as outlined above by Losq MD (PGY2). Renal function remains stable as BP controlled, Awaiting a RA duplex study today to eval for RAS. Leidy Massar K.,MD 12/18/2012 9:07 AM

## 2012-12-18 NOTE — Progress Notes (Signed)
Social Work Patient ID: Thomas [name], male   DOB: [birth year], 32 y.o.   MRN: [id number] Spoke with ins case manager-Deb with [facility] who feels pt could be discharged today all of the tests he is having are OP procedures. Spoke with Pam-PA and she checked with Dr Allena Katz and he is agreeable to this.  Plan now for pt to be discharged later today. Will make pt aware and Pam-PA to go over discharge instructions.

## 2012-12-18 NOTE — Progress Notes (Signed)
Social Work Patient ID: Thomas [name], male   DOB: 1980-12-27, 32 y.o.   MRN: [id number] Met with MD to inform pt has been discharged from therapies due to meeting his goals.  He has discussed with renal and plan to discharge versus taking pt to Their service.  OP and PCP arranged for pt at follow up.  Pt reports ready to go home.  Awaiting MD decision.

## 2012-12-19 ENCOUNTER — Inpatient Hospital Stay (HOSPITAL_COMMUNITY): Payer: 59 | Admitting: Physical Therapy

## 2012-12-19 NOTE — Progress Notes (Signed)
12/19/12 Patient discharged at 21:32 with all his belongings. Patient escorted by nurse tech to car. All discharge instructions provided by Marissa Nestle, PA. No further questions asked. A. Sherlyne Crownover,LPN

## 2012-12-20 ENCOUNTER — Ambulatory Visit (HOSPITAL_COMMUNITY): Payer: 59 | Admitting: *Deleted

## 2012-12-20 ENCOUNTER — Encounter (HOSPITAL_COMMUNITY): Payer: 59 | Admitting: *Deleted

## 2012-12-21 NOTE — Progress Notes (Signed)
Discharge summary # [phone]

## 2012-12-22 ENCOUNTER — Ambulatory Visit: Payer: 59 | Admitting: Rehabilitative and Restorative Service Providers"

## 2012-12-22 ENCOUNTER — Encounter: Payer: 59 | Admitting: Speech Pathology

## 2012-12-22 ENCOUNTER — Telehealth: Payer: Self-pay | Admitting: *Deleted

## 2012-12-22 ENCOUNTER — Ambulatory Visit: Payer: 59 | Admitting: Speech Pathology

## 2012-12-22 LAB — UIFE/LIGHT CHAINS/TP QN, 24-HR UR
Albumin, U: DETECTED
Free Lambda Lt Chains,Ur: 0.43 mg/dL (ref 0.02–0.67)
Free Lt Chn Excr Rate: 101.52 mg/d
Time: 24 hours
Total Protein, Urine-Ur/day: 130 mg/d (ref 10–140)
Total Protein, Urine: 5.4 mg/dL

## 2012-12-22 LAB — PROTEIN ELECTROPH W RFLX QUANT IMMUNOGLOBULINS
Albumin ELP: 51.9 % — ABNORMAL LOW (ref 55.8–66.1)
Alpha-1-Globulin: 4.7 % (ref 2.9–4.9)
Alpha-2-Globulin: 11.7 % (ref 7.1–11.8)
Beta 2: 5.7 % (ref 3.2–6.5)

## 2012-12-22 NOTE — Discharge Summary (Signed)
[name], Thomas NO.:  [id number]  MEDICAL RECORD NO.:  [id number]  LOCATION:  4029                         FACILITY:  [facility]  PHYSICIAN:  Erick Colace, M.D.DATE OF BIRTH:  [birth year]  DATE OF ADMISSION:  12/09/2012 DATE OF DISCHARGE:  12/18/2012                              DISCHARGE SUMMARY   DISCHARGE DIAGNOSES: 1. Right middle cerebral artery infarct. 2. Hypertension. 3. Atrial fibrillation. 4. Morbid obesity. 5. Acute on chronic renal failure.  HISTORY OF PRESENT ILLNESS:  [name] is a 32 year old male with history of cardiomyopathy, AFib, morbid obesity, who was admitted on December 07, 2012, with left-sided weakness and slurred speech.  BP markedly elevated at 212/132, and patient reported noncompliance with antihypertensives and aspirin for the past 9 months.  CT of head showed indistinctness in right __________ region and patient was treated with tPA once blood pressure stabilized.  Followup CT showed evolving cytotoxic edema suggesting early right MCA infarct.  No visible hemorrhage.  Past tPA, Dr. Gala Romney was consulted for input on AFib and BP.  He recommended resuming home medicine as well as addition of Cardizem for rate control, will require anticoagulation as well as outpatient sleep study.  A 2D echo done showed EF of 50-55% with dilated left atrium, and severe concentric hypertrophy.  Carotid Doppler showed no ICA stenosis.  Neurology recommended Xarelto for CVA prophylaxis due to compliance issues.  Patient continued to be limited by left-sided weakness with decrease in balance.  Therapy team recommended CIR and the patient admitted on December 09, 2012, for intensive therapies.  PAST MEDICAL HISTORY:  Cardiomyopathy, AFib, paroxysmal SVT, ventricular hypertrophy, overweight, renal insufficiency.  FUNCTIONAL HISTORY:  Patient was independent and working as a Best boy at [employer].  FUNCTIONAL STATUS:  The patient  required plus to total assist 60-70% for transfers, plus to total assist 70% for ambulating 15 feet with rolling walker with cues for lower extremity sequencing and walker advancement. He required mod assist with upper body bathing, total assist for lower body bathing and dressing tasks.  RECENT LABS:  Check of lytes from December 18, 2012, revealed sodium 135, potassium 4.4, chloride 99, CO2 24, BUN 46, creatinine 2.43, glucose 82. CBC of December 10, 2012, reveals hemoglobin 14.5, hematocrit 42.9, white count 6.9, platelets 172.  The parathyroid hormone was at 66.8.  A 24- hour urine results pending.  RADIOLOGICAL RESULTS:  Renal ultrasound showed no hydronephrosis, possible increased renal echogenicity related to medical renal disease and right renal cyst.  Renal artery duplex done, results pending.  HOSPITAL COURSE:  [name] was admitted to Rehab on December 09, 2012, for inpatient therapies to consist of PT/OT at least 3 hours 5 days a week.  Past admission, physiatrist, rehab, RN, and therapy team have worked together to provide customized collaborative interdisciplinary care.  Rehab RN has worked with the patient on bowel and bladder program as well as safety plan.  The patient's blood pressures were monitored on b.i.d. basis and medications were titrated for better BP control.  He was noted to develop renal insufficiency and hydrochlorothiazide was discontinued and spironolactone dose was decreased.  Creatinine check of December 16, 2012,  was noted to be elevated at 2.56.  He was treated with gentle hydration and Renal was consulted for input.  They recommended renal ultrasound and renal duplex as well as 24-hour urine for workup of acute kidney injury.  They felt acute creatinine increase likely from lowering of blood pressure.  His creatinine levels were checked on December 17, 2012 and December 18, 2012, and were relatively stable, therefore ARB was continued for now.   They will follow up with patient on outpatient basis for recheck of renal status and for further input.  During the patient's stay in rehab, weekly team conferences were held to monitor patient's progress, set goals as well as discuss barriers to discharge.  PT has worked with patient with focus on safety and functional use of left upper extremity as well as ADL and simple home management tasks.  The patient was showing improvement in motor control of left upper extremity, however, required chewing to compensate for the loss of sensation in left hand.  The patient was at modified independent level for bathing and dressing tasks.  Physical Therapy has worked with the patient on balance, mobility, and strengthening.  The patient was at modified independent level for transfers and mobility.  By time of discharge, his Berg score had improved to 56/56.  Further followup outpatient therapies to continue past discharge.  DISCHARGE MEDICATIONS: 1. Coreg 12.5 mg p.o. b.i.d. 2. Cardizem CD 240 mg p.o. per day. 3. Cardura 1 mg p.o. per day. 4. Avapro 300 mg p.o. per day. 5. Xarelto 20 mg p.o. per day. 6. Aldactone 25 mg p.o. per day.  ACTIVITY LEVEL:  At intermittent supervision.  DIET:  Low fat, low cholesterol.  SPECIAL INSTRUCTIONS:  Neuro, outpatient PT/OT, speech therapy to begin on December 23, 2012.  FOLLOWUP:  The patient to follow up with Dr. Zetta Bills on January 06, 2013, at 2 p.m.  Keep appointment for labs 1 week prior. Follow up with Dr. Claudette Laws on January 01, 2013, at 11:15.  Follow up with Nicki Reaper, nurse practitioner on December 28, 2012, at 10:30.     Delle Reining, P.A.   ______________________________ Erick Colace, M.D.    PL/MEDQ  D:  12/21/2012  T:  12/22/2012  Job:  [id number]  cc:   Zetta Bills, MD Nicki Reaper, NP-C

## 2012-12-22 NOTE — Telephone Encounter (Signed)
Called and left message on identified voice mail that patient does not need blood work before appointment. If Dr. Wynn Banker Request labs notified him we could do this in the office.

## 2012-12-22 NOTE — Telephone Encounter (Signed)
Patient has a question about upcoming appointment. Does he need blood work before? Can it be done in our office? Please call.

## 2012-12-23 ENCOUNTER — Ambulatory Visit: Payer: 59

## 2012-12-23 ENCOUNTER — Encounter: Payer: 59 | Admitting: Speech Pathology

## 2012-12-23 ENCOUNTER — Ambulatory Visit: Payer: 59 | Admitting: Occupational Therapy

## 2012-12-23 ENCOUNTER — Ambulatory Visit: Payer: 59 | Admitting: Physical Therapy

## 2012-12-23 ENCOUNTER — Ambulatory Visit: Payer: 59 | Attending: Physical Medicine & Rehabilitation | Admitting: *Deleted

## 2012-12-23 DIAGNOSIS — R279 Unspecified lack of coordination: Secondary | ICD-10-CM | POA: Insufficient documentation

## 2012-12-23 DIAGNOSIS — R41841 Cognitive communication deficit: Secondary | ICD-10-CM | POA: Insufficient documentation

## 2012-12-23 DIAGNOSIS — R4701 Aphasia: Secondary | ICD-10-CM | POA: Insufficient documentation

## 2012-12-23 DIAGNOSIS — Z5189 Encounter for other specified aftercare: Secondary | ICD-10-CM | POA: Insufficient documentation

## 2012-12-23 DIAGNOSIS — R4189 Other symptoms and signs involving cognitive functions and awareness: Secondary | ICD-10-CM | POA: Insufficient documentation

## 2012-12-23 DIAGNOSIS — M6281 Muscle weakness (generalized): Secondary | ICD-10-CM | POA: Insufficient documentation

## 2012-12-28 ENCOUNTER — Ambulatory Visit (INDEPENDENT_AMBULATORY_CARE_PROVIDER_SITE_OTHER): Payer: 59 | Admitting: Internal Medicine

## 2012-12-28 ENCOUNTER — Encounter: Payer: Self-pay | Admitting: Internal Medicine

## 2012-12-28 ENCOUNTER — Other Ambulatory Visit (INDEPENDENT_AMBULATORY_CARE_PROVIDER_SITE_OTHER): Payer: 59

## 2012-12-28 VITALS — BP 142/94 | HR 66 | Temp 97.0°F | Ht 73.0 in | Wt 318.5 lb

## 2012-12-28 DIAGNOSIS — G4733 Obstructive sleep apnea (adult) (pediatric): Secondary | ICD-10-CM

## 2012-12-28 DIAGNOSIS — I4891 Unspecified atrial fibrillation: Secondary | ICD-10-CM

## 2012-12-28 DIAGNOSIS — Z0000 Encounter for general adult medical examination without abnormal findings: Secondary | ICD-10-CM

## 2012-12-28 DIAGNOSIS — N289 Disorder of kidney and ureter, unspecified: Secondary | ICD-10-CM

## 2012-12-28 LAB — URINALYSIS
Bilirubin Urine: NEGATIVE
Leukocytes, UA: NEGATIVE
Urobilinogen, UA: 0.2 (ref 0.0–1.0)

## 2012-12-28 LAB — RENAL FUNCTION PANEL
Creatinine, Ser: 2 mg/dL — ABNORMAL HIGH (ref 0.4–1.5)
GFR: 50.54 mL/min — ABNORMAL LOW (ref >60.00)
Glucose, Bld: 82 mg/dL (ref 70–99)
Phosphorus: 3.8 mg/dL (ref 2.3–4.6)
Sodium: 138 mEq/L (ref 135–145)

## 2012-12-28 LAB — MAGNESIUM: Magnesium: 1.8 mg/dL (ref 1.5–2.5)

## 2012-12-28 NOTE — Progress Notes (Signed)
HPI  Pt presents to the clinic today to establish care. He has not had a PCP in the past. He was recently admitted to the hospital after having a TIA. He was discharged on a number of blood pressure medications as well as Xarelto. He does have an appointment with Dr. Allena Katz (who is requesting lab work) for his kidney dysfunction and well as his resistant hypertension. He was advised to see a cardiologist as well for this and his a fib but has not been set up with one. He also has concerns of sleep apnea. He does not sleep well at night. He finds himself sleepy during the day. His mother has problems with sleep apnea and he thinks he needs to be evaluated via a sleep study.  Flu: 11/2012 Tetanus: 2010 Eye doctor: yearly Dentist: no  Past Medical History  Diagnosis Date  . Cardiomyopathy 11/28/2010  . Palpitation 11/28/2010  . Atrial fibrillation 10/29/2010  . Benign essential hypertension 12/15/2008  . Paroxysmal SVT (supraventricular tachycardia) 12/15/2008  . 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 Renal insufficiency 11/28/2010  . Overweight 08/23/2010  . Hematuria, unspecified 12/15/2008  . History of chicken pox     Current Outpatient Prescriptions  Medication Sig Dispense Refill  . carvedilol (COREG) 12.5 MG tablet Take 1 tablet (12.5 mg total) by mouth 2 (two) times daily with a meal.  60 tablet  1  . diltiazem (CARDIZEM CD) 240 MG 24 hr capsule Take 1 capsule (240 mg total) by mouth daily.  30 capsule  1  . doxazosin (CARDURA) 1 MG tablet Take 1 tablet (1 mg total) by mouth daily.  30 tablet  1  . irbesartan (AVAPRO) 300 MG tablet Take 1 tablet (300 mg total) by mouth daily.  30 tablet  1  . Rivaroxaban (XARELTO) 20 MG TABS Take 1 tablet (20 mg total) by mouth daily with supper.  30 tablet  1  . spironolactone (ALDACTONE) 25 MG tablet Take 1 tablet (25 mg total)  by mouth daily.  30 tablet  1    No Known Allergies  Family History  Problem Relation Age of Onset  . Hypertension Mother   . Diabetes Mother   . Hypertension Father   . Glaucoma Father   . Hyperlipidemia Other     Parent    History   Social History  . Marital Status: Single    Spouse Name: N/A    Number of Children: N/A  . Years of Education: 14   Occupational History  . Behavioral Health   .     Social History Main Topics  . Smoking status: Never Smoker   . Smokeless tobacco: Never Used  . Alcohol Use: 0.6 oz/week    1 Shots of liquor per week     Comment: <1/day  . Drug Use: No  . Sexually Active: Yes    Birth Control/ Protection: Condom   Other Topics Concern  . Not on file   Social History Narrative   Neither of his parents nor any siblings have any cardiac or heart rhythm issues. He has 2 grandparents that were on blood thinners but doesn't know why.Regular exercise-noCaffeine Use-yes    ROS:  Constitutional: Denies fever, malaise, fatigue, headache or abrupt weight changes.  HEENT: Denies eye pain, eye redness, ear pain, ringing in the ears, wax buildup, runny nose, nasal congestion, bloody nose, or sore  throat. Respiratory: Denies difficulty breathing, shortness of breath, cough or sputum production.   Cardiovascular: Denies chest pain, chest tightness, palpitations or swelling in the hands or feet.  Gastrointestinal: Denies abdominal pain, bloating, constipation, diarrhea or blood in the stool.  GU: Denies frequency, urgency, pain with urination, blood in urine, odor or discharge. Musculoskeletal: Denies decrease in range of motion, difficulty with gait, muscle pain or joint pain and swelling.  Skin: Denies redness, rashes, lesions or ulcercations.  Neurological: Denies dizziness, difficulty with memory, difficulty with speech or problems with balance and coordination.   No other specific complaints in a complete review of systems (except as listed in  HPI above).  PE:  BP 142/94  Pulse 66  Temp 97 F (36.1 C) (Oral)  Ht 6\' 1"  (1.854 m)  Wt 318 lb 8 oz (144.471 kg)  BMI 42.02 kg/m2  SpO2 97% Wt Readings from Last 3 Encounters:  12/28/12 318 lb 8 oz (144.471 kg)  12/16/12 315 lb 14.7 oz (143.3 kg)  12/08/12 340 lb (154.223 kg)    General: Appears his stated age, obese but well developed, well nourished in NAD. HEENT: Head: normal shape and size; Eyes: sclera white, no icterus, conjunctiva pink, PERRLA and EOMs intact; Ears: Tm's gray and intact, normal light reflex; Nose: mucosa pink and moist, septum midline; Throat/Mouth: Teeth present, mucosa pink and moist, no lesions or ulcerations noted.  Neck: Normal range of motion. Neck supple, trachea midline. No massses, lumps or thyromegaly present.  Cardiovascular: Normal rate and rhythm. S1,S2 noted.  No murmur, rubs or gallops noted. No JVD or BLE edema. No carotid bruits noted. Pulmonary/Chest: Normal effort and positive vesicular breath sounds. No respiratory distress. No wheezes, rales or ronchi noted.  Abdomen: Soft and nontender. Normal bowel sounds, no bruits noted. No distention or masses noted. Liver, spleen and kidneys non palpable. Musculoskeletal: Normal range of motion. No signs of joint swelling. No difficulty with gait.  Neurological: Alert and oriented. Cranial nerves II-XII intact. Coordination normal. +DTRs bilaterally. Psychiatric: Mood and affect normal. Behavior is normal. Judgment and thought content normal.   EKG:  BMET    Component Value Date/Time   NA 135 12/18/2012 0625   K 4.4 12/18/2012 0625   CL 99 12/18/2012 0625   CO2 24 12/18/2012 0625   GLUCOSE 82 12/18/2012 0625   BUN 46* 12/18/2012 0625   CREATININE 2.43* 12/18/2012 0625   CALCIUM 10.1 12/18/2012 0625   GFRNONAA 34* 12/18/2012 0625   GFRAA 39* 12/18/2012 0625    Lipid Panel     Component Value Date/Time   CHOL 142 12/08/2012 0424   TRIG 98 12/08/2012 0424   HDL 44 12/08/2012 0424   CHOLHDL 3.2  12/08/2012 0424   VLDL 20 12/08/2012 0424   LDLCALC 78 12/08/2012 0424    CBC    Component Value Date/Time   WBC 6.9 12/10/2012 0555   RBC 5.01 12/10/2012 0555   HGB 14.5 12/10/2012 0555   HCT 42.9 12/10/2012 0555   PLT 172 12/10/2012 0555   MCV 85.6 12/10/2012 0555   MCH 28.9 12/10/2012 0555   MCHC 33.8 12/10/2012 0555   RDW 14.1 12/10/2012 0555   LYMPHSABS 2.5 12/10/2012 0555   MONOABS 0.6 12/10/2012 0555   EOSABS 0.1 12/10/2012 0555   BASOSABS 0.0 12/10/2012 0555    Hgb A1C Lab Results  Component Value Date   HGBA1C 5.8* 12/08/2012     Assessment and Plan:  Preventative health maintenance:  Start a diet and exercise program  Will obtain basic screening labs today Pt utd on all HM  Afib and resistant hypertension:  Will refer to cardiology for further management   Kidney dysfunction:  Pt already has appointment with kidney specialist  OSA, new onset with additional workup required:  Will obtain sleep study  RTC in 6 months or sooner if needed

## 2012-12-28 NOTE — Patient Instructions (Signed)
Health Maintenance, Males A healthy lifestyle and preventative care can promote health and wellness.  Maintain regular health, dental, and eye exams.  Eat a healthy diet. Foods like vegetables, fruits, whole grains, low-fat dairy products, and lean protein foods contain the nutrients you need without too many calories. Decrease your intake of foods high in solid fats, added sugars, and salt. Get information about a proper diet from your caregiver, if necessary.  Regular physical exercise is one of the most important things you can do for your health. Most adults should get at least 150 minutes of moderate-intensity exercise (any activity that increases your heart rate and causes you to sweat) each week. In addition, most adults need muscle-strengthening exercises on 2 or more days a week.   Maintain a healthy weight. The body mass index (BMI) is a screening tool to identify possible weight problems. It provides an estimate of body fat based on height and weight. Your caregiver can help determine your BMI, and can help you achieve or maintain a healthy weight. For adults 20 years and older:  A BMI below 18.5 is considered underweight.  A BMI of 18.5 to 24.9 is normal.  A BMI of 25 to 29.9 is considered overweight.  A BMI of 30 and above is considered obese.  Maintain normal blood lipids and cholesterol by exercising and minimizing your intake of saturated fat. Eat a balanced diet with plenty of fruits and vegetables. Blood tests for lipids and cholesterol should begin at age 20 and be repeated every 5 years. If your lipid or cholesterol levels are high, you are over 50, or you are a high risk for heart disease, you may need your cholesterol levels checked more frequently.Ongoing high lipid and cholesterol levels should be treated with medicines, if diet and exercise are not effective.  If you smoke, find out from your caregiver how to quit. If you do not use tobacco, do not start.  If you  choose to drink alcohol, do not exceed 2 drinks per day. One drink is considered to be 12 ounces (355 mL) of beer, 5 ounces (148 mL) of wine, or 1.5 ounces (44 mL) of liquor.  Avoid use of street drugs. Do not share needles with anyone. Ask for help if you need support or instructions about stopping the use of drugs.  High blood pressure causes heart disease and increases the risk of stroke. Blood pressure should be checked at least every 1 to 2 years. Ongoing high blood pressure should be treated with medicines if weight loss and exercise are not effective.  If you are 45 to 32 years old, ask your caregiver if you should take aspirin to prevent heart disease.  Diabetes screening involves taking a blood sample to check your fasting blood sugar level. This should be done once every 3 years, after age 45, if you are within normal weight and without risk factors for diabetes. Testing should be considered at a younger age or be carried out more frequently if you are overweight and have at least 1 risk factor for diabetes.  Colorectal cancer can be detected and often prevented. Most routine colorectal cancer screening begins at the age of 50 and continues through age 75. However, your caregiver may recommend screening at an earlier age if you have risk factors for colon cancer. On a yearly basis, your caregiver may provide home test kits to check for hidden blood in the stool. Use of a small camera at the end of a tube,   to directly examine the colon (sigmoidoscopy or colonoscopy), can detect the earliest forms of colorectal cancer. Talk to your caregiver about this at age 50, when routine screening begins. Direct examination of the colon should be repeated every 5 to 10 years through age 75, unless early forms of pre-cancerous polyps or small growths are found.  Hepatitis C blood testing is recommended for all people born from 1945 through 1965 and any individual with known risks for hepatitis C.  Healthy  men should no longer receive prostate-specific antigen (PSA) blood tests as part of routine cancer screening. Consult with your caregiver about prostate cancer screening.  Testicular cancer screening is not recommended for adolescents or adult males who have no symptoms. Screening includes self-exam, caregiver exam, and other screening tests. Consult with your caregiver about any symptoms you have or any concerns you have about testicular cancer.  Practice safe sex. Use condoms and avoid high-risk sexual practices to reduce the spread of sexually transmitted infections (STIs).  Use sunscreen with a sun protection factor (SPF) of 30 or greater. Apply sunscreen liberally and repeatedly throughout the day. You should seek shade when your shadow is shorter than you. Protect yourself by wearing long sleeves, pants, a wide-brimmed hat, and sunglasses year round, whenever you are outdoors.  Notify your caregiver of new moles or changes in moles, especially if there is a change in shape or color. Also notify your caregiver if a mole is larger than the size of a pencil eraser.  A one-time screening for abdominal aortic aneurysm (AAA) and surgical repair of large AAAs by sound wave imaging (ultrasonography) is recommended for ages 65 to 75 years who are current or former smokers.  Stay current with your immunizations. Document Released: 05/16/2008 Document Revised: 02/10/2012 Document Reviewed: 04/15/2011 ExitCare Patient Information 2013 [employer]. Self-Exam Of The Testicles Young men ages 15-35 are the ones who most commonly get cancer of the testicles. About 300 young men die of this disease each year. Testicular cancer does occur in middle-aged or older men but to a lesser extent. This cancer can almost always be cured if it is found before it gets bad and if it is treated. WORDS TO KNOW:  Testicles are the 2 egg-shaped glands that make hormones and sperm in men.  The scrotum is the skin around  testicles.  Epididymis is the rope-like part that is behind and above each testis. It collects sperm made by the testis. WHICH MEN ARE AT HIGH RISK FOR CANCER OF THE TESTICLES?  Men between ages 15 to 31.  Men who are Caucasian.  Men who were born with a testicle that had not moved down into the scrotum.  Men whose testicles have gotten smaller because of an infection.  Men whose fathers or brothers have had cancer of the testicles. SYMPTOMS OF TESTICULAR CANCER  A painless swelling in one of your testicles.  A hard lump. Some lumps may be an infection.  A heavy feeling in your testicles.  An ache in your lower belly (abdomen) or groin. HOW OFTEN SHOULD I CHECK MY TESTICLES? You should check your testicles every month. HOW SHOULD I DO THIS CHECK? It is best to check your testicles right after a warm shower or bath.  Look at your testicles for any swelling. You may need to use a mirror.  Use both your hands to roll each testicle between your thumb and fingers. Feel for any lumps or changes in the size of the testicle. Press firmly. You   may find that one testicle is a little bigger than the other. This is normal.  Next, check the epididymis on each testicle. This is the part where most testicular cancers happen. It is normal for the epididymis to feel soft and uneven. When you get used to how your epididymis feels, you will be able to tell if there is any change.  WHAT IF I FIND ANY SWELLINGS OR LUMPS?  Call your doctor. Some lumps may be an infection. If the lump or swelling is a cancer, it can be treated before it gets worse.  Document Released: 02/14/2009 Document Revised: 02/10/2012 Document Reviewed: 02/14/2009 ExitCare Patient Information 2013 [employer].  

## 2012-12-30 ENCOUNTER — Ambulatory Visit: Payer: 59 | Admitting: Cardiology

## 2013-01-01 ENCOUNTER — Inpatient Hospital Stay: Payer: 59 | Admitting: Physical Medicine & Rehabilitation

## 2013-01-06 ENCOUNTER — Telehealth: Payer: Self-pay | Admitting: Internal Medicine

## 2013-01-06 NOTE — Telephone Encounter (Signed)
Reece Levy is a Engineer, civil (consulting) from [facility], she is hoping to get a clinical follow up for this patient since he has a history of non-compliance, she can be reached at [phone] ext [phone]

## 2013-01-07 NOTE — Telephone Encounter (Signed)
Please read phone note below and advise. 

## 2013-01-13 ENCOUNTER — Ambulatory Visit: Payer: 59 | Admitting: Occupational Therapy

## 2013-01-13 ENCOUNTER — Ambulatory Visit: Payer: 59 | Attending: Physical Medicine & Rehabilitation | Admitting: Speech Pathology

## 2013-01-13 ENCOUNTER — Ambulatory Visit: Payer: 59 | Admitting: *Deleted

## 2013-01-13 DIAGNOSIS — M6281 Muscle weakness (generalized): Secondary | ICD-10-CM | POA: Insufficient documentation

## 2013-01-13 DIAGNOSIS — R4189 Other symptoms and signs involving cognitive functions and awareness: Secondary | ICD-10-CM | POA: Insufficient documentation

## 2013-01-13 DIAGNOSIS — R279 Unspecified lack of coordination: Secondary | ICD-10-CM | POA: Insufficient documentation

## 2013-01-13 DIAGNOSIS — R41841 Cognitive communication deficit: Secondary | ICD-10-CM | POA: Insufficient documentation

## 2013-01-13 DIAGNOSIS — R4701 Aphasia: Secondary | ICD-10-CM | POA: Insufficient documentation

## 2013-01-13 DIAGNOSIS — Z5189 Encounter for other specified aftercare: Secondary | ICD-10-CM | POA: Insufficient documentation

## 2013-01-15 ENCOUNTER — Ambulatory Visit: Payer: 59

## 2013-01-15 ENCOUNTER — Ambulatory Visit: Payer: 59 | Admitting: Occupational Therapy

## 2013-01-15 ENCOUNTER — Inpatient Hospital Stay: Payer: 59 | Admitting: Physical Medicine & Rehabilitation

## 2013-01-18 ENCOUNTER — Ambulatory Visit: Payer: 59 | Admitting: Occupational Therapy

## 2013-01-18 ENCOUNTER — Ambulatory Visit: Payer: 59

## 2013-01-19 ENCOUNTER — Other Ambulatory Visit: Payer: Self-pay | Admitting: Internal Medicine

## 2013-01-19 ENCOUNTER — Ambulatory Visit (HOSPITAL_BASED_OUTPATIENT_CLINIC_OR_DEPARTMENT_OTHER): Payer: 59 | Admitting: Physical Medicine & Rehabilitation

## 2013-01-19 ENCOUNTER — Encounter: Payer: Self-pay | Admitting: Physical Medicine & Rehabilitation

## 2013-01-19 ENCOUNTER — Encounter: Payer: 59 | Attending: Physical Medicine & Rehabilitation

## 2013-01-19 VITALS — BP 147/84 | HR 71 | Resp 14 | Ht 73.0 in | Wt 311.2 lb

## 2013-01-19 DIAGNOSIS — G8190 Hemiplegia, unspecified affecting unspecified side: Secondary | ICD-10-CM

## 2013-01-19 DIAGNOSIS — I69998 Other sequelae following unspecified cerebrovascular disease: Secondary | ICD-10-CM | POA: Insufficient documentation

## 2013-01-19 DIAGNOSIS — I10 Essential (primary) hypertension: Secondary | ICD-10-CM | POA: Insufficient documentation

## 2013-01-19 DIAGNOSIS — I4891 Unspecified atrial fibrillation: Secondary | ICD-10-CM | POA: Insufficient documentation

## 2013-01-19 DIAGNOSIS — I6340 Cerebral infarction due to embolism of unspecified cerebral artery: Secondary | ICD-10-CM

## 2013-01-19 DIAGNOSIS — I428 Other cardiomyopathies: Secondary | ICD-10-CM | POA: Insufficient documentation

## 2013-01-19 DIAGNOSIS — I69919 Unspecified symptoms and signs involving cognitive functions following unspecified cerebrovascular disease: Secondary | ICD-10-CM | POA: Insufficient documentation

## 2013-01-19 DIAGNOSIS — N189 Chronic kidney disease, unspecified: Secondary | ICD-10-CM

## 2013-01-19 NOTE — Patient Instructions (Addendum)
Graduated return to driving instructions were provided. It is recommended that the patient first drives with another licensed driver in an empty parking lot. If the patient does well with this, and they can drive on a quiet street with the licensed driver. If the patient does well with this they can drive on a busy street with a licensed driver. If the patient does well with this, the next time out they can go by himself. For the first month after resuming driving, I recommend no nighttime or Interstate driving.   Dr. Jones Broom cardiology

## 2013-01-19 NOTE — Progress Notes (Signed)
Pt informed-he states that he will come in for labwork this week.

## 2013-01-19 NOTE — Progress Notes (Signed)
Orders needed by [facility].

## 2013-01-19 NOTE — Progress Notes (Signed)
Subjective:    Patient ID: Thomas [name], male    DOB: 07/03/1981, 32 y.o.   MRN: [id number]  HPI [name] is a 32 year old male with  history of cardiomyopathy, AFib, morbid obesity, who was admitted on  December 07, 2012, with left-sided weakness and slurred speech. BP  markedly elevated at 212/132, and patient reported noncompliance with  antihypertensives and aspirin for the past 9 months. CT of head showed  indistinctness in right __________ region and patient was treated with  tPA once blood pressure stabilized. Followup CT showed evolving  cytotoxic edema suggesting early right MCA infarct. No visible  hemorrhage. Past tPA, Dr. Gala Romney was consulted for input on AFib and  BP. He recommended resuming home medicine as well as addition of  Cardizem for rate control, will require anticoagulation as well as  outpatient sleep study. A 2D echo done showed EF of 50-55% with dilated  left atrium, and severe concentric hypertrophy. Carotid Doppler showed  no ICA stenosis. Neurology recommended Xarelto for CVA prophylaxis due  to compliance issues. No falls Seen by Nephrology medication adjusted, follow up in 2 weeks Has appt with Cardiology OT and speech 2x per week neuro rehab Working on memory and fine motor  Pain Inventory Average Pain 0 Pain Right Now 0 My pain is no pain  In the last 24 hours, has pain interfered with the following? General activity 0 Relation with others 0 Enjoyment of life 0 What TIME of day is your pain at its worst? no pain Sleep (in general) Good  Pain is worse with: no pain Pain improves with: no pain Relief from Meds: no pain med  Mobility walk without assistance ability to climb steps?  yes do you drive?  no  Function Do you have any goals in this area?  yes  Neuro/Psych numbness  Prior Studies Any changes since last visit?  no  Physicians involved in your care Any changes since last visit?  no   Family History  Problem  Relation Age of Onset  . Hypertension Mother   . Diabetes Mother   . Hypertension Father   . Glaucoma Father   . Hyperlipidemia Other     Parent   History   Social History  . Marital Status: Single    Spouse Name: N/A    Number of Children: N/A  . Years of Education: 14   Occupational History  . Behavioral Health   .     Social History Main Topics  . Smoking status: Never Smoker   . Smokeless tobacco: Never Used  . Alcohol Use: 0.6 oz/week    1 Shots of liquor per week     Comment: <1/day  . Drug Use: No  . Sexually Active: Yes    Birth Control/ Protection: Condom   Other Topics Concern  . None   Social History Narrative   Neither of his parents nor any siblings have any cardiac or heart rhythm issues. He has 2 grandparents that were on blood thinners but doesn't know why.      Regular exercise-no   Caffeine Use-yes   History reviewed. No pertinent past surgical history. Past Medical History  Diagnosis Date  . Cardiomyopathy 11/28/2010  . Palpitation 11/28/2010  . Atrial fibrillation 10/29/2010  . Benign essential hypertension 12/15/2008  . Paroxysmal SVT (supraventricular tachycardia) 12/15/2008  . 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  Renal insufficiency 11/28/2010  . Overweight 08/23/2010  . Hematuria, unspecified 12/15/2008  . History of chicken pox    BP 147/84  Pulse 71  Resp 14  Ht 6\' 1"  (1.854 m)  Wt 311 lb 3.2 oz (141.159 kg)  BMI 41.07 kg/m2  SpO2 99%    Review of Systems  Neurological: Positive for numbness.  All other systems reviewed and are negative.       Objective:   Physical Exam  Nursing note and vitals reviewed. Constitutional: He is oriented to person, place, and time. He appears well-developed and well-nourished.  HENT:  Head: Normocephalic and atraumatic.  Eyes: Conjunctivae and EOM are normal. Pupils  are equal, round, and reactive to light.  Neck: Normal range of motion.  Musculoskeletal:       Right shoulder: Normal.       Left shoulder: Normal.  Neurological: He is alert and oriented to person, place, and time. He has normal reflexes. He displays no atrophy and no tremor. A sensory deficit is present. No cranial nerve deficit. He displays no seizure activity. Coordination abnormal. Gait normal. GCS eye subscore is 4. GCS verbal subscore is 5. GCS motor subscore is 6.  Reduced sensation left hand Reduced fine motor left hand cannot oppose thumb to fifth finger Positive dysdiadochokinesis left hand with supination/pronation  Psychiatric: He has a normal mood and affect.   Visual fields are intact to confrontation testing Extraocular muscles are intact No evidence of nystagmus       Assessment & Plan:  1. 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 Also has some mild cognitive deficits. Recommend continue speech therapy and OT.  Graduated return to driving instructions were provided. It is recommended that the patient first drives with another licensed driver in an empty parking lot. If the patient does well with this, and they can drive on a quiet street with the licensed driver. If the patient does well with this they can drive on a busy street with a licensed driver. If the patient does well with this, the next time out they can go by himself. For the first month after resuming driving, I recommend no nighttime or Interstate driving.

## 2013-01-20 ENCOUNTER — Ambulatory Visit: Payer: 59 | Admitting: Speech Pathology

## 2013-01-20 ENCOUNTER — Ambulatory Visit: Payer: 59 | Admitting: Occupational Therapy

## 2013-01-20 DIAGNOSIS — Z0279 Encounter for issue of other medical certificate: Secondary | ICD-10-CM

## 2013-01-22 ENCOUNTER — Other Ambulatory Visit (INDEPENDENT_AMBULATORY_CARE_PROVIDER_SITE_OTHER): Payer: 59

## 2013-01-22 DIAGNOSIS — N189 Chronic kidney disease, unspecified: Secondary | ICD-10-CM

## 2013-01-22 LAB — BASIC METABOLIC PANEL
BUN: 34 mg/dL — ABNORMAL HIGH (ref 6–23)
Chloride: 104 mEq/L (ref 96–112)
Creatinine, Ser: 2.6 mg/dL — ABNORMAL HIGH (ref 0.4–1.5)
GFR: 37.27 mL/min — ABNORMAL LOW (ref >60.00)

## 2013-01-22 LAB — MAGNESIUM: Magnesium: 1.9 mg/dL (ref 1.5–2.5)

## 2013-01-25 ENCOUNTER — Ambulatory Visit (HOSPITAL_BASED_OUTPATIENT_CLINIC_OR_DEPARTMENT_OTHER): Payer: 59 | Attending: Internal Medicine

## 2013-01-25 ENCOUNTER — Ambulatory Visit: Payer: 59 | Admitting: Occupational Therapy

## 2013-01-25 ENCOUNTER — Ambulatory Visit: Payer: 59

## 2013-01-25 VITALS — Ht 73.0 in | Wt 305.0 lb

## 2013-01-25 DIAGNOSIS — G4733 Obstructive sleep apnea (adult) (pediatric): Secondary | ICD-10-CM | POA: Insufficient documentation

## 2013-01-25 DIAGNOSIS — I4891 Unspecified atrial fibrillation: Secondary | ICD-10-CM | POA: Insufficient documentation

## 2013-01-25 NOTE — Progress Notes (Signed)
Labs faxed to [facility]

## 2013-01-27 ENCOUNTER — Ambulatory Visit: Payer: 59 | Admitting: Occupational Therapy

## 2013-01-27 ENCOUNTER — Other Ambulatory Visit: Payer: Self-pay | Admitting: Internal Medicine

## 2013-01-27 ENCOUNTER — Telehealth: Payer: Self-pay | Admitting: Internal Medicine

## 2013-01-27 ENCOUNTER — Ambulatory Visit: Payer: 59

## 2013-01-27 DIAGNOSIS — N289 Disorder of kidney and ureter, unspecified: Secondary | ICD-10-CM

## 2013-01-27 NOTE — Telephone Encounter (Signed)
Nurse Care Manager request call back to see how patient is doing since discharge

## 2013-01-27 NOTE — Telephone Encounter (Signed)
Did she leave a number? Thomas [name]

## 2013-01-27 NOTE — Progress Notes (Signed)
Left message for pt to callback office.  

## 2013-01-27 NOTE — Telephone Encounter (Signed)
[phone] ext [phone]

## 2013-01-28 NOTE — Progress Notes (Signed)
Left message for pt to callback office.  

## 2013-01-29 NOTE — Progress Notes (Signed)
Left message on VM for pt to come in to have lab work done, preferably before Tuesday.

## 2013-02-01 ENCOUNTER — Ambulatory Visit: Payer: 59

## 2013-02-01 ENCOUNTER — Other Ambulatory Visit (INDEPENDENT_AMBULATORY_CARE_PROVIDER_SITE_OTHER): Payer: 59

## 2013-02-01 ENCOUNTER — Ambulatory Visit: Payer: 59 | Attending: Physical Medicine & Rehabilitation | Admitting: Occupational Therapy

## 2013-02-01 DIAGNOSIS — M6281 Muscle weakness (generalized): Secondary | ICD-10-CM | POA: Insufficient documentation

## 2013-02-01 DIAGNOSIS — Z5189 Encounter for other specified aftercare: Secondary | ICD-10-CM | POA: Insufficient documentation

## 2013-02-01 DIAGNOSIS — N289 Disorder of kidney and ureter, unspecified: Secondary | ICD-10-CM

## 2013-02-01 DIAGNOSIS — R41841 Cognitive communication deficit: Secondary | ICD-10-CM | POA: Insufficient documentation

## 2013-02-01 DIAGNOSIS — R4701 Aphasia: Secondary | ICD-10-CM | POA: Insufficient documentation

## 2013-02-01 DIAGNOSIS — R279 Unspecified lack of coordination: Secondary | ICD-10-CM | POA: Insufficient documentation

## 2013-02-01 DIAGNOSIS — R4189 Other symptoms and signs involving cognitive functions and awareness: Secondary | ICD-10-CM | POA: Insufficient documentation

## 2013-02-01 LAB — RENAL FUNCTION PANEL
Albumin: 3.8 g/dL (ref 3.5–5.2)
Creatinine, Ser: 2.1 mg/dL — ABNORMAL HIGH (ref 0.4–1.5)
Glucose, Bld: 111 mg/dL — ABNORMAL HIGH (ref 70–99)
Phosphorus: 4.3 mg/dL (ref 2.3–4.6)
Potassium: 4.7 mEq/L (ref 3.5–5.1)
Sodium: 140 mEq/L (ref 135–145)

## 2013-02-02 ENCOUNTER — Encounter: Payer: Self-pay | Admitting: Cardiology

## 2013-02-02 ENCOUNTER — Ambulatory Visit (INDEPENDENT_AMBULATORY_CARE_PROVIDER_SITE_OTHER): Payer: 59 | Admitting: Cardiology

## 2013-02-02 VITALS — BP 134/84 | HR 70 | Ht 73.0 in | Wt 309.0 lb

## 2013-02-02 DIAGNOSIS — I517 Cardiomegaly: Secondary | ICD-10-CM

## 2013-02-02 DIAGNOSIS — I428 Other cardiomyopathies: Secondary | ICD-10-CM

## 2013-02-02 DIAGNOSIS — I10 Essential (primary) hypertension: Secondary | ICD-10-CM

## 2013-02-02 DIAGNOSIS — I471 Supraventricular tachycardia: Secondary | ICD-10-CM

## 2013-02-02 DIAGNOSIS — I4891 Unspecified atrial fibrillation: Secondary | ICD-10-CM

## 2013-02-02 NOTE — Patient Instructions (Addendum)
The current medical regimen is effective;  continue present plan and medications.  Follow up in 6 months with Dr Hochrein.  You will receive a letter in the mail 2 months before you are due.  Please call us when you receive this letter to schedule your follow up appointment.  

## 2013-02-02 NOTE — Progress Notes (Signed)
HPI The patient presents after recent hospitalization with a CVA. He was admitted with hypertensive urgency. He also had atrial fibrillation. He was treated with TPA. He has since had a reasonable recovery but still has some left-sided arm weakness and he is unfortunately left-handed. He was apparently in sinus rhythm at the time of discharge. He did have an echocardiogram which demonstrated a preserved ejection fraction. He did have some evidence of LVH. Since discharge he says his blood pressure has been much better controlled. He has been good about taking his medications. He has not felt any palpitations that was his previous atrial fibrillation. He has had no chest pressure, neck or arm discomfort. He's had no weight gain or edema. He is exercise. His creatinines have been followed and stable.  No Known Allergies  Current Outpatient Prescriptions  Medication Sig Dispense Refill  . carvedilol (COREG) 12.5 MG tablet Take 1 tablet (12.5 mg total) by mouth 2 (two) times daily with a meal.  60 tablet  1  . diltiazem (CARDIZEM CD) 240 MG 24 hr capsule Take 1 capsule (240 mg total) by mouth daily.  30 capsule  1  . doxazosin (CARDURA) 1 MG tablet Take 1 tablet (1 mg total) by mouth daily.  30 tablet  1  . olmesartan-hydrochlorothiazide (BENICAR HCT) 40-25 MG per tablet Take 1 tablet by mouth daily.      . Rivaroxaban (XARELTO) 20 MG TABS Take 1 tablet (20 mg total) by mouth daily with supper.  30 tablet  1  . spironolactone (ALDACTONE) 25 MG tablet Take 1 tablet (25 mg total) by mouth daily.  30 tablet  1   No current facility-administered medications for this visit.    Past Medical History  Diagnosis Date  . Cardiomyopathy 11/28/2010  . Palpitation 11/28/2010  . Atrial fibrillation 10/29/2010  . Benign essential hypertension 12/15/2008  . Paroxysmal SVT (supraventricular tachycardia) 12/15/2008  . 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 Renal insufficiency 11/28/2010  . Overweight 08/23/2010  . Hematuria, unspecified 12/15/2008  . History of chicken pox     No past surgical history on file.  ROS:  As stated in the HPI and negative for all other systems.  PHYSICAL EXAM BP 134/84  Pulse 70  Ht 6\' 1"  (1.854 m)  Wt 309 lb (140.161 kg)  BMI 40.78 kg/m2 GENERAL:  Well appearing HEENT:  Pupils equal round and reactive, fundi not visualized, oral mucosa unremarkable NECK:  No jugular venous distention, waveform within normal limits, carotid upstroke brisk and symmetric, no bruits, no thyromegaly LYMPHATICS:  No cervical, inguinal adenopathy LUNGS:  Clear to auscultation bilaterally BACK:  No CVA tenderness CHEST:  Unremarkable HEART:  PMI not displaced or sustained,S1 and S2 within normal limits, no S3, no S4, no clicks, no rubs, no murmurs ABD:  Flat, positive bowel sounds normal in frequency in pitch, no bruits, no rebound, no guarding, no midline pulsatile mass, no hepatomegaly, no splenomegaly EXT:  2 plus pulses throughout, no edema, no cyanosis no clubbing SKIN:  No rashes no nodules NEURO:  Cranial nerves II through XII grossly intact, mild weakness left upper extremity PSYCH:  Cognitively intact, oriented to person place and time   ASSESSMENT AND PLAN  ATRIAL FIBRILLATION:  He does not feel any ongoing fibrillation. No change in therapy is indicated.  HTN:  His blood pressure is now well controlled on the  current regimen. I reviewed his most recent basic metabolic profile and his creatinine is stable. I would think this would be a reasonable regimen and he will continue this.  CKD:  As above. He is following with Dr. Allena Katz tomorrow.  OVERWEIGHT:  We discussed this and I prescribed the [facility] Diet.   VENTRICULAR HYPERTROPHY:  I reviewed echocardiography results with the patient. There is a mention of cardiomyopathy but I do not see a  reduced ejection fraction in the past. He currently has a well preserved ejection fraction. We will again concentrate on hypertension control and weight loss. Of note the patient did have a sleep study but I could not find results yet on this.

## 2013-02-04 ENCOUNTER — Ambulatory Visit: Payer: 59 | Admitting: Occupational Therapy

## 2013-02-04 ENCOUNTER — Ambulatory Visit: Payer: 59

## 2013-02-05 DIAGNOSIS — G4710 Hypersomnia, unspecified: Secondary | ICD-10-CM

## 2013-02-05 DIAGNOSIS — G4730 Sleep apnea, unspecified: Secondary | ICD-10-CM

## 2013-02-06 NOTE — Procedures (Signed)
NAMENEITHAN, DAY NO.:  [id number]  MEDICAL RECORD NO.:  [id number]          PATIENT TYPE:  OUT  LOCATION:  SLEEP CENTER                 FACILITY:  [facility]  PHYSICIAN:  Barbaraann Share, MD,FCCPDATE OF BIRTH:  Apr 26[birth year] 1981  DATE OF STUDY:  01/25/2013                           NOCTURNAL POLYSOMNOGRAM  REFERRING PHYSICIAN:  REGINA BAITY  LOCATION:  Sleep lab.  INDICATION:  Studies hypersomnia with sleep apnea.  EPWORTH SLEEPINESS SCORE:  3.  MEDICATIONS:  SLEEP ARCHITECTURE:  The patient had a total sleep time of 326 minutes with no slow-wave sleep with only 49 minutes of REM.  Sleep onset latency was mildly prolonged at 40 minutes and REM onset was at the lower end of normal at 78 minutes.  Sleep efficiency was mildly reduced at 87%.  RESPIRATORY DATA:  The patient was found to have no obstructive apneas and 28 obstructive hypopneas, giving him an apnea-hypopnea index of 5.2 events per hour.  The events occurred almost exclusively in the supine position, there was loud snoring noted throughout.  OXYGEN DATA:  There was O2 desaturation as low as 88% with the patient's obstructive events.  CARDIAC DATA:  The patient was noted to be in atrial fibrillation with a controlled ventricular response.  There were occasional PAC noted.  MOVEMENT-PARASOMNIA:  The patient had no significant leg jerks or other abnormal behaviors seen.  IMPRESSIONS-RECOMMENDATIONS: 1. Minimal obstructive sleep apnea/hypopnea syndrome, with an AHI of     5.2 events per hour and oxygen desaturation as low as 88%.     Treatment for this degree of sleep apnea can include a trial of     weight loss alone, upper airway surgery, dental appliance, and also     CPAP.  Given the very mild nature of his sleep apnea, this does not     represent a significant cardiovascular risk for him.  Therefore,     the decision to treat this aggressively     should be based on its impact to his quality of  life. 2. Atrial fibrillation with controlled ventricular response, as well     as occasional PAC.     Barbaraann Share, MD,FCCP Diplomate, American Board of Sleep Medicine    [facility]  D:  02/05/2013 10:08:05  T:  02/06/2013 02:27:29  Job:  [id number]

## 2013-02-09 ENCOUNTER — Ambulatory Visit: Payer: 59 | Admitting: Occupational Therapy

## 2013-02-09 ENCOUNTER — Ambulatory Visit: Payer: 59

## 2013-02-11 ENCOUNTER — Ambulatory Visit: Payer: 59 | Admitting: Occupational Therapy

## 2013-02-11 ENCOUNTER — Ambulatory Visit: Payer: 59

## 2013-02-15 ENCOUNTER — Ambulatory Visit (HOSPITAL_BASED_OUTPATIENT_CLINIC_OR_DEPARTMENT_OTHER): Payer: 59 | Admitting: Physical Medicine & Rehabilitation

## 2013-02-15 ENCOUNTER — Encounter: Payer: Self-pay | Admitting: Physical Medicine & Rehabilitation

## 2013-02-15 ENCOUNTER — Encounter: Payer: 59 | Attending: Physical Medicine & Rehabilitation

## 2013-02-15 VITALS — HR 76 | Resp 14 | Ht 73.0 in | Wt 312.0 lb

## 2013-02-15 DIAGNOSIS — G8190 Hemiplegia, unspecified affecting unspecified side: Secondary | ICD-10-CM

## 2013-02-15 DIAGNOSIS — I69998 Other sequelae following unspecified cerebrovascular disease: Secondary | ICD-10-CM | POA: Insufficient documentation

## 2013-02-15 DIAGNOSIS — I4891 Unspecified atrial fibrillation: Secondary | ICD-10-CM | POA: Insufficient documentation

## 2013-02-15 DIAGNOSIS — I69919 Unspecified symptoms and signs involving cognitive functions following unspecified cerebrovascular disease: Secondary | ICD-10-CM | POA: Insufficient documentation

## 2013-02-15 DIAGNOSIS — I10 Essential (primary) hypertension: Secondary | ICD-10-CM | POA: Insufficient documentation

## 2013-02-15 DIAGNOSIS — R209 Unspecified disturbances of skin sensation: Secondary | ICD-10-CM

## 2013-02-15 DIAGNOSIS — I428 Other cardiomyopathies: Secondary | ICD-10-CM | POA: Insufficient documentation

## 2013-02-15 NOTE — Patient Instructions (Addendum)
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 Reevaluation prior to release to full time

## 2013-02-15 NOTE — Progress Notes (Signed)
Subjective:    Patient ID: Thomas [name], male    DOB: [birth year], 32 y.o.   MRN: [id number]  HPI Mr. Jairon Ripberger is a 32 year old male with  history of cardiomyopathy, AFib, morbid obesity, who was admitted on  December 07, 2012, with left-sided weakness and slurred speech. BP  markedly elevated at 212/132, and patient reported noncompliance with  antihypertensives and aspirin for the past 9 months. CT of head showed  indistinctness in right periopercular region and patient was treated with  tPA once blood pressure stabilized. Followup CT showed evolving  cytotoxic edema suggesting early right MCA infarct. No visible  hemorrhage. Past tPA, Dr. Gala Romney was consulted for input on AFib and  BP. He recommended resuming home medicine as well as addition of  Cardizem for rate control, will require anticoagulation as well as  outpatient sleep study. A 2D echo done showed EF of 50-55% with dilated  left atrium, and severe concentric hypertrophy. Carotid Doppler showed  no ICA stenosis. Neurology recommended Xarelto for CVA prophylaxis due  to compliance issues.  No falls Seen by cardiology and nephro since last visit OT and speech 2x per week neuro rehab  Working on multi tasking, complex cognitive tasks and fine motor  Pain Inventory Average Pain 0 Pain Right Now 0 My pain is n/a  In the last 24 hours, has pain interfered with the following? General activity 0 Relation with others 0 Enjoyment of life 0 What TIME of day is your pain at its worst? 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  no  Physicians involved in your care Any changes since last visit?  no   Family History  Problem Relation Age of Onset  . Hypertension Mother   . Diabetes Mother   .  Hypertension Father   . Glaucoma Father   . Hyperlipidemia Other     Parent   History   Social History  . Marital Status: Single    Spouse Name: N/A    Number of Children: N/A  . Years of Education: 14   Occupational History  . Behavioral Health   .     Social History Main Topics  . Smoking status: Never Smoker   . Smokeless tobacco: Never Used  . Alcohol Use: 0.6 oz/week    1 Shots of liquor per week     Comment: <1/day  . Drug Use: No  . Sexually Active: Yes    Birth Control/ Protection: Condom   Other Topics Concern  . None   Social History Narrative   Neither of his parents nor any siblings have any cardiac or heart rhythm issues. He has 2 grandparents that were on blood thinners but doesn't know why.      Regular exercise-no   Caffeine Use-yes   History reviewed. No pertinent past surgical history. Past Medical History  Diagnosis Date  . Cardiomyopathy 11/28/2010  . Palpitation 11/28/2010  . Atrial fibrillation 10/29/2010  . Benign essential hypertension 12/15/2008  . Paroxysmal SVT (supraventricular tachycardia) 12/15/2008  . 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 Renal insufficiency 11/28/2010  . Overweight 08/23/2010  . Hematuria, unspecified 12/15/2008  . History of chicken  pox    BP   Pulse 76  Resp 14  Ht 6\' 1"  (1.854 m)  Wt 312 lb (141.522 kg)  BMI 41.17 kg/m2  SpO2 99%     Review of Systems  All other systems reviewed and are negative.       Objective:   Physical Exam Nursing note and vitals reviewed.  Constitutional: He is oriented to person, place, and time. He appears well-developed and well-nourished.  HENT:  Head: Normocephalic and atraumatic.  Eyes: Conjunctivae and EOM are normal. Pupils are equal, round, and reactive to light.  Neck: Normal range of motion.  Musculoskeletal:  Right shoulder: Normal.   Left shoulder: Normal.  Neurological: He is alert and oriented to person, place, and time. He has normal reflexes. He displays no atrophy and no tremor. A sensory deficit is present. No cranial nerve deficit. He displays no seizure activity. Coordination abnormal. Gait normal. GCS eye subscore is 4. GCS verbal subscore is 5. GCS motor subscore is 6.  Reduced sensation left hand Reduced fine motor left hand cannot oppose thumb to fifth finger Positive dysdiadochokinesis left hand with supination/pronation  Psychiatric: He has a normal mood and affect.   Visual fields are intact to confrontation testing  Extraocular muscles are intact  No evidence of nystagmus        Assessment & Plan:  1. 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 Also has some mild cognitive deficits. Recommend continue speech therapy and OT.  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 Patient has simulated job tasks at home. Endurance has improved and is able to tolerate physical therapy as well as around the house chores. Recommend 2 weeks at 6 hour per day then recheck and if doing well resume 12 hour per day

## 2013-02-16 ENCOUNTER — Ambulatory Visit: Payer: 59 | Admitting: Occupational Therapy

## 2013-02-16 ENCOUNTER — Ambulatory Visit: Payer: 59

## 2013-02-18 ENCOUNTER — Ambulatory Visit: Payer: 59 | Admitting: Occupational Therapy

## 2013-02-18 ENCOUNTER — Ambulatory Visit: Payer: 59

## 2013-02-19 ENCOUNTER — Other Ambulatory Visit: Payer: Self-pay | Admitting: *Deleted

## 2013-02-19 MED ORDER — SPIRONOLACTONE 25 MG PO TABS: 25.0000 mg | ORAL_TABLET | Freq: Every day | ORAL | Status: DC

## 2013-02-19 MED ORDER — CARVEDILOL 12.5 MG PO TABS: 12.5000 mg | ORAL_TABLET | Freq: Two times a day (BID) | ORAL | Status: DC

## 2013-02-19 MED ORDER — DILTIAZEM HCL ER COATED BEADS 240 MG PO CP24: 240.0000 mg | ORAL_CAPSULE | Freq: Every day | ORAL | Status: DC

## 2013-02-19 MED ORDER — RIVAROXABAN 20 MG PO TABS: 20.0000 mg | ORAL_TABLET | Freq: Every day | ORAL | Status: DC

## 2013-02-19 NOTE — Telephone Encounter (Signed)
R'cd fax from [facility] for refill of Diltiazem, Xarelto, Spironolactone and Carvedilol.

## 2013-02-22 ENCOUNTER — Ambulatory Visit: Payer: 59 | Admitting: Occupational Therapy

## 2013-02-22 ENCOUNTER — Ambulatory Visit: Payer: 59 | Admitting: Speech Pathology

## 2013-02-24 ENCOUNTER — Encounter: Payer: 59 | Admitting: Occupational Therapy

## 2013-03-01 ENCOUNTER — Encounter: Payer: Self-pay | Admitting: Physical Medicine & Rehabilitation

## 2013-03-01 ENCOUNTER — Ambulatory Visit (HOSPITAL_BASED_OUTPATIENT_CLINIC_OR_DEPARTMENT_OTHER): Payer: 59 | Admitting: Physical Medicine & Rehabilitation

## 2013-03-01 ENCOUNTER — Encounter: Payer: 59 | Attending: Physical Medicine & Rehabilitation

## 2013-03-01 VITALS — BP 134/85 | HR 61 | Resp 14 | Ht 73.0 in | Wt 306.8 lb

## 2013-03-01 DIAGNOSIS — R279 Unspecified lack of coordination: Secondary | ICD-10-CM | POA: Insufficient documentation

## 2013-03-01 DIAGNOSIS — R209 Unspecified disturbances of skin sensation: Secondary | ICD-10-CM | POA: Insufficient documentation

## 2013-03-01 DIAGNOSIS — G8190 Hemiplegia, unspecified affecting unspecified side: Secondary | ICD-10-CM

## 2013-03-01 DIAGNOSIS — I69998 Other sequelae following unspecified cerebrovascular disease: Secondary | ICD-10-CM | POA: Insufficient documentation

## 2013-03-01 NOTE — Patient Instructions (Addendum)
May work 12 hour days 36 hours per wk

## 2013-03-01 NOTE — Progress Notes (Signed)
Subjective:    Patient ID: Thomas [name], male    DOB: 1981-10-19, 32 y.o.   MRN: [id number]  HPI Mr. Nezar Buckles is a 32 year old male with  history of cardiomyopathy, AFib, morbid obesity, who was admitted on  December 07, 2012, with left-sided weakness and slurred speech. BP  markedly elevated at 212/132, and patient reported noncompliance with  antihypertensives and aspirin for the past 9 months. CT of head showed  indistinctness in right periopercular region and patient was treated with  tPA once blood pressure stabilized. Followup CT showed evolving  cytotoxic edema suggesting early right MCA infarct. No visible  hemorrhage. Past tPA, Dr. Gala Romney was consulted for input on AFib and  BP. He recommended resuming home medicine as well as addition of  Cardizem for rate control, will require anticoagulation as well as  outpatient sleep study. A 2D echo done showed EF of 50-55% with dilated  left atrium, and severe concentric hypertrophy. Carotid Doppler showed  no ICA stenosis  Worked 4 hour days x 5 days, then started working 8 hour days as of 02/22/2013 No difficulty with this schedule Usually works 12 hour days. Is done with all therapies except for a followup visit with OT next month Has appointment with neurology next Pain Inventory Average Pain 0 Pain Right Now 0 My pain is no pain  In the last 24 hours, has pain interfered with the following? General activity 0 Relation with others 0 Enjoyment of life 0 What TIME of day is your pain at its worst? no pain Sleep (in general) Good  Pain is worse with: no pain Pain improves with: no pain Relief from Meds: no pain  Mobility walk without assistance ability to climb steps?  yes do you drive?  yes  Function employed # of hrs/week 36  Neuro/Psych No problems in this area  Prior Studies Any changes since last visit?  no  Physicians involved in your care Any changes since last visit?  no   Family History   Problem Relation Age of Onset  . Hypertension Mother   . Diabetes Mother   . Hypertension Father   . Glaucoma Father   . Hyperlipidemia Other     Parent   History   Social History  . Marital Status: Single    Spouse Name: N/A    Number of Children: N/A  . Years of Education: 14   Occupational History  . Behavioral Health   .     Social History Main Topics  . Smoking status: Never Smoker   . Smokeless tobacco: Never Used  . Alcohol Use: 0.6 oz/week    1 Shots of liquor per week     Comment: <1/day  . Drug Use: No  . Sexually Active: Yes    Birth Control/ Protection: Condom   Other Topics Concern  . None   Social History Narrative   Neither of his parents nor any siblings have any cardiac or heart rhythm issues. He has 2 grandparents that were on blood thinners but doesn't know why.      Regular exercise-no   Caffeine Use-yes   History reviewed. No pertinent past surgical history. Past Medical History  Diagnosis Date  . Cardiomyopathy 11/28/2010  . Palpitation 11/28/2010  . Atrial fibrillation 10/29/2010  . Benign essential hypertension 12/15/2008  . Paroxysmal SVT (supraventricular tachycardia) 12/15/2008  . 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  Renal insufficiency 11/28/2010  . Overweight 08/23/2010  . Hematuria, unspecified 12/15/2008  . History of chicken pox    BP 134/85  Pulse 61  Resp 14  Ht 6\' 1"  (1.854 m)  Wt 306 lb 12.8 oz (139.164 kg)  BMI 40.49 kg/m2  SpO2 98%     Review of Systems  All other systems reviewed and are negative.       Objective:   Physical Exam  Reduced sensation left hand, reduced proprioception and LT intact pinprick sensation Reduced fine motor left hand cannot oppose thumb to fifth finger negative dysdiadochokinesis left hand with supination/pronation  Psychiatric: He has a normal mood and  affect.  Visual fields are intact to confrontation testing  Extraocular muscles are intact  No evidence of nystagmus       Assessment & Plan:  1. 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

## 2013-03-22 ENCOUNTER — Ambulatory Visit: Payer: 59 | Attending: Physical Medicine & Rehabilitation | Admitting: Occupational Therapy

## 2013-03-22 DIAGNOSIS — R279 Unspecified lack of coordination: Secondary | ICD-10-CM | POA: Insufficient documentation

## 2013-03-22 DIAGNOSIS — M6281 Muscle weakness (generalized): Secondary | ICD-10-CM | POA: Insufficient documentation

## 2013-03-22 DIAGNOSIS — R4189 Other symptoms and signs involving cognitive functions and awareness: Secondary | ICD-10-CM | POA: Insufficient documentation

## 2013-03-22 DIAGNOSIS — Z5189 Encounter for other specified aftercare: Secondary | ICD-10-CM | POA: Insufficient documentation

## 2013-03-22 DIAGNOSIS — R4701 Aphasia: Secondary | ICD-10-CM | POA: Insufficient documentation

## 2013-03-22 DIAGNOSIS — R41841 Cognitive communication deficit: Secondary | ICD-10-CM | POA: Insufficient documentation

## 2013-03-30 ENCOUNTER — Ambulatory Visit: Payer: Self-pay | Admitting: Neurology

## 2013-05-17 ENCOUNTER — Ambulatory Visit: Payer: 59 | Admitting: Dietician

## 2013-09-03 ENCOUNTER — Other Ambulatory Visit: Payer: Self-pay | Admitting: *Deleted

## 2013-09-03 ENCOUNTER — Other Ambulatory Visit: Payer: Self-pay | Admitting: Internal Medicine

## 2013-09-03 MED ORDER — DILTIAZEM HCL ER COATED BEADS 240 MG PO CP24: 240.0000 mg | ORAL_CAPSULE | Freq: Every day | ORAL | Status: DC

## 2013-09-03 MED ORDER — SPIRONOLACTONE 25 MG PO TABS: 25.0000 mg | ORAL_TABLET | Freq: Every day | ORAL | Status: DC

## 2013-09-23 ENCOUNTER — Other Ambulatory Visit: Payer: Self-pay | Admitting: Internal Medicine

## 2013-09-27 ENCOUNTER — Other Ambulatory Visit: Payer: Self-pay | Admitting: Internal Medicine

## 2013-12-21 ENCOUNTER — Other Ambulatory Visit (INDEPENDENT_AMBULATORY_CARE_PROVIDER_SITE_OTHER): Payer: 59

## 2013-12-21 ENCOUNTER — Encounter: Payer: Self-pay | Admitting: Physician Assistant

## 2013-12-21 ENCOUNTER — Ambulatory Visit (INDEPENDENT_AMBULATORY_CARE_PROVIDER_SITE_OTHER): Payer: 59 | Admitting: Physician Assistant

## 2013-12-21 VITALS — BP 130/76 | HR 78 | Temp 97.6°F | Resp 18

## 2013-12-21 DIAGNOSIS — E875 Hyperkalemia: Secondary | ICD-10-CM

## 2013-12-21 DIAGNOSIS — Z0000 Encounter for general adult medical examination without abnormal findings: Secondary | ICD-10-CM

## 2013-12-21 DIAGNOSIS — I10 Essential (primary) hypertension: Secondary | ICD-10-CM

## 2013-12-21 DIAGNOSIS — I4891 Unspecified atrial fibrillation: Secondary | ICD-10-CM

## 2013-12-21 LAB — CBC WITH DIFFERENTIAL/PLATELET
BASOS ABS: 0 10*3/uL (ref 0.0–0.1)
Basophils Relative: 0.4 % (ref 0.0–3.0)
EOS ABS: 0.1 10*3/uL (ref 0.0–0.7)
Eosinophils Relative: 1.5 % (ref 0.0–5.0)
HEMATOCRIT: 43.1 % (ref 39.0–52.0)
HEMOGLOBIN: 14.6 g/dL (ref 13.0–17.0)
LYMPHS ABS: 1.8 10*3/uL (ref 0.7–4.0)
Lymphocytes Relative: 41.2 % (ref 12.0–46.0)
MCHC: 33.8 g/dL (ref 30.0–36.0)
MCV: 89.1 fl (ref 78.0–100.0)
MONO ABS: 0.3 10*3/uL (ref 0.1–1.0)
Monocytes Relative: 7.2 % (ref 3.0–12.0)
NEUTROS ABS: 2.2 10*3/uL (ref 1.4–7.7)
Neutrophils Relative %: 49.7 % (ref 43.0–77.0)
Platelets: 174 10*3/uL (ref 150.0–400.0)
RBC: 4.84 Mil/uL (ref 4.22–5.81)
RDW: 13.9 % (ref 11.5–14.6)
WBC: 4.4 10*3/uL — ABNORMAL LOW (ref 4.5–10.5)

## 2013-12-21 LAB — BASIC METABOLIC PANEL
BUN: 36 mg/dL — AB (ref 6–23)
CHLORIDE: 104 meq/L (ref 96–112)
CO2: 26 mEq/L (ref 19–32)
CREATININE: 2.7 mg/dL — AB (ref 0.4–1.5)
Calcium: 9.6 mg/dL (ref 8.4–10.5)
GFR: 34.73 mL/min — AB (ref >60.00)
Glucose, Bld: 96 mg/dL (ref 70–99)
Potassium: 5.7 mEq/L — ABNORMAL HIGH (ref 3.5–5.1)
Sodium: 136 mEq/L (ref 135–145)

## 2013-12-21 LAB — URINALYSIS, ROUTINE W REFLEX MICROSCOPIC
Bilirubin Urine: NEGATIVE
Hgb urine dipstick: NEGATIVE
Ketones, ur: NEGATIVE
Leukocytes, UA: NEGATIVE
NITRITE: NEGATIVE
PH: 5.5 (ref 5.0–8.0)
Specific Gravity, Urine: 1.025 (ref 1.000–1.030)
Total Protein, Urine: NEGATIVE
Urine Glucose: NEGATIVE
Urobilinogen, UA: 0.2 (ref 0.0–1.0)

## 2013-12-21 LAB — LIPID PANEL
CHOLESTEROL: 140 mg/dL (ref 0–200)
HDL: 36.5 mg/dL — ABNORMAL LOW (ref >39.00)
LDL Cholesterol: 89 mg/dL (ref 0–99)
Total CHOL/HDL Ratio: 4
Triglycerides: 73 mg/dL (ref 0.0–149.0)
VLDL: 14.6 mg/dL (ref 0.0–40.0)

## 2013-12-21 LAB — HEPATIC FUNCTION PANEL
ALK PHOS: 40 U/L (ref 39–117)
ALT: 20 U/L (ref 0–53)
AST: 24 U/L (ref 0–37)
Albumin: 4.3 g/dL (ref 3.5–5.2)
BILIRUBIN DIRECT: 0.1 mg/dL (ref 0.0–0.3)
TOTAL PROTEIN: 8.5 g/dL — AB (ref 6.0–8.3)
Total Bilirubin: 0.6 mg/dL (ref 0.3–1.2)

## 2013-12-21 MED ORDER — OLMESARTAN MEDOXOMIL-HCTZ 40-25 MG PO TABS
1.0000 | ORAL_TABLET | Freq: Every day | ORAL | Status: DC
Start: 2013-12-21 — End: 2014-03-28

## 2013-12-21 NOTE — Progress Notes (Signed)
Subjective:    Patient ID: Thomas [name], male    DOB: 16-Jul-1981, 33 y.o.   MRN: [id number]  HPI Comments: Patient is a 33 year old male who present to the clinic for annual physical exam. Patient reports history of stable HTN, A-fib, TIA and renal insufficiency.states he follows with Dr. Antoine Poche, cardiology for his a fib and HTN, and will have an appointment this month for yearly exam. States follows with Dr. Allena Katz for renal insuffiencey. Patient states all conditions have been stable with medication and lifestyle changes. Has no concerns at this time. Denies chest pain/palpitations, cough, SOB, extremity swelling, headaches, dizzy/lightheadedness, change in bowel/bladder habits.    Review of Systems  Constitutional: Negative for fever, appetite change and fatigue.  Eyes: Negative for pain and visual disturbance.  Respiratory: Negative for cough, chest tightness and shortness of breath.   Cardiovascular: Negative for chest pain, palpitations and leg swelling.  Gastrointestinal: Negative for nausea and vomiting.  Genitourinary: Negative for dysuria and difficulty urinating.  Skin: Negative for rash.  Neurological: Negative for dizziness, weakness, light-headedness, numbness and headaches.  All other systems reviewed and are negative.      Objective:   Physical Exam  Vitals reviewed. Constitutional: He is oriented to person, place, and time. He appears well-developed and well-nourished. No distress.  HENT:  Head: Normocephalic and atraumatic.  Right Ear: External ear normal.  Left Ear: External ear normal.  Nose: Nose normal.  Mouth/Throat: Uvula is midline, oropharynx is clear and moist and mucous membranes are normal.  Eyes: Conjunctivae and EOM are normal. Pupils are equal, round, and reactive to light.  Neck: Normal range of motion. No thyromegaly present.  Cardiovascular: Normal rate and regular rhythm.  Exam reveals no gallop and no friction rub.   No murmur heard. Pulses:      Radial pulses are 2+ on the right side, and 2+ on the left side.       Dorsalis pedis pulses are 2+ on the right side, and 2+ on the left side.  Pulmonary/Chest: Effort normal and breath sounds normal. He has no wheezes. He has no rhonchi. He has no rales.  Abdominal: Soft. Bowel sounds are normal. He exhibits no distension. There is no tenderness.  Obese  Musculoskeletal: Normal range of motion.  FROM U/LE bilateral  Lymphadenopathy:    He has no cervical adenopathy.       Right: No supraclavicular adenopathy present.       Left: No supraclavicular adenopathy present.  Neurological: He is alert and oriented to person, place, and time. He has normal strength. He displays a negative Romberg sign. Coordination and gait normal.  Skin: Skin is warm and dry.  Psychiatric: He has a normal mood and affect.       Past Medical History  Diagnosis Date  . Cardiomyopathy 11/28/2010  . Palpitation 11/28/2010  . Atrial fibrillation 10/29/2010  . Benign essential hypertension 12/15/2008  . Paroxysmal SVT (supraventricular tachycardia) 12/15/2008  . 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 Renal insufficiency 11/28/2010  . Overweight 08/23/2010  . Hematuria, unspecified 12/15/2008  . History of chicken pox    Lab Results  Component Value Date   WBC 6.9 12/10/2012   HGB 14.5 12/10/2012   HCT 42.9 12/10/2012   PLT 172 12/10/2012   GLUCOSE 111* 02/01/2013   CHOL 142 12/08/2012   TRIG 98 12/08/2012   HDL  44 12/08/2012   LDLCALC 78 12/08/2012   ALT 26 12/10/2012   AST 28 12/10/2012   NA 140 02/01/2013   K 4.7 02/01/2013   CL 106 02/01/2013   CREATININE 2.1* 02/01/2013   BUN 31* 02/01/2013   CO2 25 02/01/2013   TSH 1.674 12/29/2008   INR 1.02 12/07/2012   HGBA1C 5.8* 12/08/2012    Assessment & Plan:    CPX/v70.0 - Patient has been counseled on age-appropriate routine health concerns for screening and  prevention. These are reviewed and up-to-date. Immunizations are up-to-date or declined. Labs ordered and will be reviewed when resulted.  Hypertension: Patient to continue medications as prescribed, including Benicar HCT 40-25 mg, 1 tab daily, spironolactone 25 mg 1 tab daily, coronary 12.5 mg tablet twice a day. Rx refill today of Benicar 40-25  Atrial fibrillation: Patient to continue medications as prescribed, including Xarelto 20 mg tablet once daily with supper.  Patient reports followup with cardiologist in next month for yearly evaluation of A. fib and hypertension. Will discuss continuation of medications with cardiologist.

## 2013-12-21 NOTE — Patient Instructions (Signed)
It was great to meet you today [name]  Keep appointment with cardiologist as discussed.   I have escribed a refill of your Benicar.  Labs have been ordered and will be reviewed.  Health Maintenance, Males A healthy lifestyle and preventative care can promote health and wellness.  Maintain regular health, dental, and eye exams.  Eat a healthy diet. Foods like vegetables, fruits, whole grains, low-fat dairy products, and lean protein foods contain the nutrients you need and are low in calories. Decrease your intake of foods high in solid fats, added sugars, and salt. Get information about a proper diet from your health care provider, if necessary.  Regular physical exercise is one of the most important things you can do for your health. Most adults should get at least 150 minutes of moderate-intensity exercise (any activity that increases your heart rate and causes you to sweat) each week. In addition, most adults need muscle-strengthening exercises on 2 or more days a week.   Maintain a healthy weight. The body mass index (BMI) is a screening tool to identify possible weight problems. It provides an estimate of body fat based on height and weight. Your health care provider can find your BMI and can help you achieve or maintain a healthy weight. For males 20 years and older:  A BMI below 18.5 is considered underweight.  A BMI of 18.5 to 24.9 is normal.  A BMI of 25 to 29.9 is considered overweight.  A BMI of 30 and above is considered obese.  Maintain normal blood lipids and cholesterol by exercising and minimizing your intake of saturated fat. Eat a balanced diet with plenty of fruits and vegetables. Blood tests for lipids and cholesterol should begin at age 33 and be repeated every 5 years. If your lipid or cholesterol levels are high, you are over 50, or you are at high risk for heart disease, you may need your cholesterol levels checked more frequently.Ongoing high lipid and  cholesterol levels should be treated with medicines, if diet and exercise are not working.  If you smoke, find out from your health care provider how to quit. If you do not use tobacco, do not start.  Lung cancer screening is recommended for adults aged 33 80 years who are at high risk for developing lung cancer because of a history of smoking. A yearly low-dose CT scan of the lungs is recommended for people who have at least a 30-pack-year history of smoking and are a current smoker or have quit within the past 15 years. A pack year of smoking is smoking an average of 1 pack of cigarettes a day for 1 year (for example, a 30-pack-year history of smoking could mean smoking 1 pack a day for 30 years or 2 packs a day for 15 years). Yearly screening should continue until the smoker has stopped smoking for at least 15 years. Yearly screening should be stopped for people who develop a health problem that would prevent them from having lung cancer treatment.  If you choose to drink alcohol, do not have more than 2 drinks per day. One drink is considered to be 12 oz (360 mL) of beer, 5 oz (150 mL) of wine, or 1.5 oz (45 mL) of liquor.  Avoid use of street drugs. Do not share needles with anyone. Ask for help if you need support or instructions about stopping the use of drugs.  High blood pressure causes heart disease and increases the risk of stroke. Blood pressure should  be checked at least every 1 2 years. Ongoing high blood pressure should be treated with medicines if weight loss and exercise are not effective.  If you are 35 33 years old, ask your health care provider if you should take aspirin to prevent heart disease.  Diabetes screening involves taking a blood sample to check your fasting blood sugar level. This should be done once every 3 years after age 29, if you are at a normal weight and without risk factors for diabetes. Testing should be considered at a younger age or be carried out more  frequently if you are overweight and have at least 1 risk factor for diabetes.  Colorectal cancer can be detected and often prevented. Most routine colorectal cancer screening begins at the age of 21 and continues through age 53. However, your health care provider may recommend screening at an earlier age if you have risk factors for colon cancer. On a yearly basis, your health care provider may provide home test kits to check for hidden blood in the stool. A small camera at the end of a tube may be used to directly examine the colon (sigmoidoscopy or colonoscopy) to detect the earliest forms of colorectal cancer. Talk to your health care provider about this at age 67, when routine screening begins. A direct exam of the colon should be repeated every 5 10 years through age 87, unless early forms of pre-cancerous polyps or small growths are found.  People who are at an increased risk for hepatitis B should be screened for this virus. You are considered at high risk for hepatitis B if:  You were born in a country where hepatitis B occurs often. Talk with your health care provider about which countries are considered high-risk.  Your parents were born in a high-risk country and you have not received a shot to protect against hepatitis B (hepatitis B vaccine).  You have HIV or AIDS.  You use needles to inject street drugs.  You live with, or have sex with, someone who has hepatitis B.  You are a man who has sex with other men (MSM).  You get hemodialysis treatment.  You take certain medicines for conditions like cancer, organ transplantation, and autoimmune conditions.  Hepatitis C blood testing is recommended for all people born from [birth year] through 1965 and any individual with known risk factors for hepatitis C.  Healthy men should no longer receive prostate-specific antigen (PSA) blood tests as part of routine cancer screening. Talk to your health care provider about prostate cancer  screening.  Testicular cancer screening is not recommended for adolescents or adult males who have no symptoms. Screening includes self-exam, a health care provider exam, and other screening tests. Consult with your health care provider about any symptoms you have or any concerns you have about testicular cancer.  Practice safe sex. Use condoms and avoid high-risk sexual practices to reduce the spread of sexually transmitted infections (STIs).  Use sunscreen. Apply sunscreen liberally and repeatedly throughout the day. You should seek shade when your shadow is shorter than you. Protect yourself by wearing long sleeves, pants, a wide-brimmed hat, and sunglasses year round, whenever you are outdoors.  Tell your health care provider of new moles or changes in moles, especially if there is a change in shape or color. Also tell your provider if a mole is larger than the size of a pencil eraser.  A one-time screening for abdominal aortic aneurysm (AAA) and surgical repair of large AAAs by ultrasound  is recommended for men aged 67 75 years who are current or former smokers.  Stay current with your vaccines (immunizations). Document Released: 05/16/2008 Document Revised: 09/08/2013 Document Reviewed: 04/15/2011 [facility] Patient Information 2014 [place], [place].   Atrial Fibrillation Atrial fibrillation is a condition that causes your heart to beat irregularly. It may also cause your heart to beat faster than normal. Atrial fibrillation can prevent your heart from pumping blood normally. It increases your risk of stroke and heart problems. HOME CARE  Take medications as told by your doctor.  Only take medications that your doctor says are safe. Some medications can make the condition worse or happen again.  If blood thinners were prescribed by your doctor, take them exactly as told. Too much can cause bleeding. Too little and you will not have the needed protection against stroke and other  problems.  Perform blood tests at home if told by your doctor.  Perform blood tests exactly as told by your doctor.  Do not drink alcohol.  Do not drink beverages with caffeine such as coffee, soda, and some teas.  Maintain a healthy weight.  Do not use diet pills unless your doctor says they are safe. They may make heart problems worse.  Follow diet instructions as told by your doctor.  Exercise regularly as told by your doctor.  Keep all follow-up appointments. GET HELP RIGHT AWAY IF:   You have chest or belly (abdominal) pain.  You feel sick to your stomach (nauseous)  You suddenly have swollen feet and ankles.  You feel dizzy.  You face, arms, or legs feel numb or weak.  There is a change in your vision or speech.  You notice a change in the speed, rhythm, or strength of your heartbeat.  You suddenly begin peeing (urinating) more often.  You get tired more easily when moving or exercising. MAKE SURE YOU:   Understand these instructions.  Will watch your condition.  Will get help right away if you are not doing well or get worse. Document Released: 08/27/2008 Document Revised: 03/15/2013 Document Reviewed: 12/29/2012 [facility] Patient Information 2014 [place], [place].  Hypertension Hypertension is another name for high blood pressure. High blood pressure may mean that your heart needs to work harder to pump blood. Blood pressure consists of two numbers, which includes a higher number over a lower number (example: 110/72). HOME CARE   Make lifestyle changes as told by your doctor. This may include weight loss and exercise.  Take your blood pressure medicine every day.  Limit how much salt you use.  Stop smoking if you smoke.  Do not use drugs.  Talk to your doctor if you are using decongestants or birth control pills. These medicines might make blood pressure higher.  Females should not drink more than 1 alcoholic drink per day. Males should not drink  more than 2 alcoholic drinks per day.  See your doctor as told. GET HELP RIGHT AWAY IF:   You have a blood pressure reading with a top number of 180 or higher.  You get a very bad headache.  You get blurred or changing vision.  You feel confused.  You feel weak, numb, or faint.  You get chest or belly (abdominal) pain.  You throw up (vomit).  You cannot breathe very well. MAKE SURE YOU:   Understand these instructions.  Will watch your condition.  Will get help right away if you are not doing well or get worse. Document Released: 05/06/2008 Document Revised: 02/10/2012 Document  Reviewed: 05/06/2008 [employer] Patient Information 2014 [place], [place].

## 2013-12-27 ENCOUNTER — Telehealth: Payer: Self-pay

## 2013-12-27 DIAGNOSIS — E875 Hyperkalemia: Secondary | ICD-10-CM | POA: Insufficient documentation

## 2013-12-27 MED ORDER — SPIRONOLACTONE 25 MG PO TABS: ORAL_TABLET | ORAL | Status: DC

## 2013-12-27 NOTE — Addendum Note (Signed)
Addended by: Ascencion Dike on: 12/27/2013 12:47 PM   Modules accepted: Orders

## 2013-12-28 NOTE — Telephone Encounter (Signed)
Message copied by Basilia Jumbo on Tue Dec 28, 2013  1:57 PM ------      Message from: Ascencion Dike      Created: Mon Dec 27, 2013 12:50 PM       Please phone patient and inform him all labs normal except elevated potassium level, patient to discontinue use of Spironolactone and follow up with Cardiology as soon as possible to review same. ------

## 2014-02-25 ENCOUNTER — Other Ambulatory Visit: Payer: Self-pay | Admitting: Internal Medicine

## 2014-02-28 ENCOUNTER — Encounter: Payer: Self-pay | Admitting: Physician Assistant

## 2014-02-28 ENCOUNTER — Ambulatory Visit (INDEPENDENT_AMBULATORY_CARE_PROVIDER_SITE_OTHER): Payer: 59 | Admitting: Physician Assistant

## 2014-02-28 VITALS — BP 122/78 | HR 65 | Ht 73.0 in | Wt 309.0 lb

## 2014-02-28 DIAGNOSIS — I10 Essential (primary) hypertension: Secondary | ICD-10-CM

## 2014-02-28 DIAGNOSIS — I4891 Unspecified atrial fibrillation: Secondary | ICD-10-CM

## 2014-02-28 DIAGNOSIS — I639 Cerebral infarction, unspecified: Secondary | ICD-10-CM

## 2014-02-28 DIAGNOSIS — E875 Hyperkalemia: Secondary | ICD-10-CM

## 2014-02-28 DIAGNOSIS — I6350 Cerebral infarction due to unspecified occlusion or stenosis of unspecified cerebral artery: Secondary | ICD-10-CM

## 2014-02-28 LAB — BASIC METABOLIC PANEL
BUN: 26 mg/dL — ABNORMAL HIGH (ref 6–23)
CALCIUM: 9.1 mg/dL (ref 8.4–10.5)
CO2: 28 mEq/L (ref 19–32)
Chloride: 99 mEq/L (ref 96–112)
Creatinine, Ser: 1.9 mg/dL — ABNORMAL HIGH (ref 0.4–1.5)
GFR: 53.24 mL/min — ABNORMAL LOW (ref >60.00)
GLUCOSE: 94 mg/dL (ref 70–99)
Potassium: 3.5 mEq/L (ref 3.5–5.1)
Sodium: 135 mEq/L (ref 135–145)

## 2014-02-28 NOTE — Patient Instructions (Signed)
Your physician wants you to follow-up in: 6 MONTHS WITH DR. HOCHREIN You will receive a reminder letter in the mail two months in advance. If you don't receive a letter, please call our office to schedule the follow-up appointment.  Your physician recommends that you return for lab work in: BMET TODAY  Your physician recommends that you continue on your current medications as directed. Please refer to the Current Medication list given to you today.  Calorie Counting Diet A calorie counting diet requires you to eat the number of calories that are right for you in a day. Calories are the measurement of how much energy you get from the food you eat. Eating the right amount of calories is important for staying at a healthy weight. If you eat too many calories, your body will store them as fat and you may gain weight. If you eat too few calories, you may lose weight. Counting the number of calories you eat during a day will help you know if you are eating the right amount. A Registered Dietitian can determine how many calories you need in a day. The amount of calories needed varies from person to person. If your goal is to lose weight, you will need to eat fewer calories. Losing weight can benefit you if you are overweight or have health problems such as heart disease, high blood pressure, or diabetes. If your goal is to gain weight, you will need to eat more calories. Gaining weight may be necessary if you have a certain health problem that causes your body to need more energy. TIPS Whether you are increasing or decreasing the number of calories you eat during a day, it may be hard to get used to changes in what you eat and drink. The following are tips to help you keep track of the number of calories you eat.  Measure foods at home with measuring cups. This helps you know the amount of food and number of calories you are eating.  Restaurants often serve food in amounts that are larger than 1 serving.  While eating out, estimate how many servings of a food you are given. For example, a serving of cooked rice is  cup or about the size of half of a fist. Knowing serving sizes will help you be aware of how much food you are eating at restaurants.  Ask for smaller portion sizes or child-size portions at restaurants.  Plan to eat half of a meal at a restaurant. Take the rest home or share the other half with a friend.  Read the Nutrition Facts panel on food labels for calorie content and serving size. You can find out how many servings are in a package, the size of a serving, and the number of calories each serving has.  For example, a package might contain 3 cookies. The Nutrition Facts panel on that package says that 1 serving is 1 cookie. Below that, it will say there are 3 servings in the container. The calories section of the Nutrition Facts label says there are 90 calories. This means there are 90 calories in 1 cookie (1 serving). If you eat 1 cookie you have eaten 90 calories. If you eat all 3 cookies, you have eaten 270 calories (3 servings x 90 calories = 270 calories). The list below tells you how big or small some common portion sizes are.  1 oz.........4 stacked dice.  3 oz........Marland Kitchen.Deck of cards.  1 tsp.......Marland Kitchen.Tip of little finger.  1 tbs......Marland Kitchen.Marland Kitchen.Thumb.  2 tbs.......Marland KitchenGolf ball.   cup......Marland KitchenHalf of a fist.  1 cup.......Marland KitchenA fist. KEEP A FOOD LOG Write down every food item you eat, the amount you eat, and the number of calories in each food you eat during the day. At the end of the day, you can add up the total number of calories you have eaten. It may help to keep a list like the one below. Find out the calorie information by reading the Nutrition Facts panel on food labels. Breakfast  Bran cereal (1 cup, 110 calories).  Fat-free milk ( cup, 45 calories). Snack  Apple (1 medium, 80 calories). Lunch  Spinach (1 cup, 20 calories).  Tomato ( medium, 20  calories).  Chicken breast strips (3 oz, 165 calories).  Shredded cheddar cheese ( cup, 110 calories).  Light [place] dressing (2 tbs, 60 calories).  Whole-wheat bread (1 slice, 80 calories).  Tub margarine (1 tsp, 35 calories).  Vegetable soup (1 cup, 160 calories). Dinner  Pork chop (3 oz, 190 calories).  Brown rice (1 cup, 215 calories).  Steamed broccoli ( cup, 20 calories).  Strawberries (1  cup, 65 calories).  Whipped cream (1 tbs, 50 calories). Daily Calorie Total: 1425 Document Released: 11/18/2005 Document Revised: 02/10/2012 Document Reviewed: 05/15/2007 [facility] Patient Information 2014 [place], [place].

## 2014-02-28 NOTE — Progress Notes (Signed)
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 He also has chronic atrial fibrillation. He has not been seen here in over 2 years. He also has obesity and was in the, weight loss program and actually lost 35 pounds. He really doesn't like this program and says he like to try to do it on his own. His spiral lactone was stopped 2-3 months ago by his primary care because of hyperkalemia. He has not had laboratory values since then. He checks his blood pressures regularly at home and they have been stable at about 116/89. He did ask if he could come off the Xarelto that he has chronic atrial fibrillation and history of CVA. I told him it would not be advised. Is not having any trouble with the Xarelto. Occasionally he'll have a little blood-tinged mucus when he blows his nose.   No Known Allergies  Current Outpatient Prescriptions on File Prior to Visit: carvedilol (COREG) 12.5 MG tablet, TAKE 1 TABLET BY MOUTH 2 TIMES DAILY WITH A MEAL., Disp: 60 tablet, Rfl: 5 diltiazem (CARDIZEM CD) 240 MG 24 hr capsule, Take 1 capsule (240 mg total) by mouth daily., Disp: 30 capsule, Rfl: 5 olmesartan-hydrochlorothiazide (BENICAR HCT) 40-25 MG per tablet, Take 1 tablet by mouth daily., Disp: 30 tablet, Rfl: 2 spironolactone (ALDACTONE) 25 MG tablet, Hold until further notice secondary to 5.7 K, Disp: 30 tablet, Rfl: 5 XARELTO 20 MG TABS tablet, TAKE 1 TABLET BY MOUTH DAILY WITH SUPPER., Disp: 30 tablet, Rfl: 5  No current facility-administered medications on file prior to visit.   Past Medical History:   Cardiomyopathy                                  11/28/2010   Palpitation                                     11/28/2010   Atrial fibrillation                             10/29/2010   Benign essential hypertension                   12/15/2008   Paroxysmal SVT (supraventricular tachycardia)   12/15/2008   Ventricular  hypertrophy                         12/15/2008     Comment:left sided, ECHO from 2010 - left ventricular               size was normal, overall left ventricular               systolic function was normal   Renal insufficiency                             11/28/2010   Overweight                                      08/23/2010   Hematuria, unspecified  12/15/2008   History of chicken pox                                      No past surgical history on file.  Review of patient's family history indicates:   Hypertension                   Mother                   Diabetes                       Mother                   Hypertension                   Father                   Glaucoma                       Father                   Hyperlipidemia                 Other                      Comment: Parent   Social History   Marital Status: Single              Spouse Name:                      Years of Education: 14              Number of children:             Occupational History Occupation          Garment/textile technologistmployer            Comment              Behavioral Health                          Social History Main Topics   Smoking Status: Never Smoker                     Smokeless Status: Never Used                       Alcohol Use: Yes           0.6 oz/week      1 Shots of liquor per week      Comment: <1/day   Drug Use: No             Sexual Activity: Yes                    Birth Control/Protection: Condom  Other Topics            Concern   None on file  Social History Narrative   Neither of his parents nor any siblings have any cardiac or heart rhythm issues. He has 2 grandparents that were on blood thinners but doesn't know why.    Regular exercise-no   Caffeine Use-yes    ROS: See  history of present illness otherwise negative   PHYSICAL EXAM: Obese, in no acute distress. Neck: No JVD, HJR, Bruit, or thyroid enlargement  Lungs: No tachypnea, clear without  wheezing, rales, or rhonchi  Cardiovascular: irreg irreg, PMI not displaced, heart sounds distant, no murmurs, gallops, bruit, thrill, or heave.  Abdomen: BS normal. Soft without organomegaly, masses, lesions or tenderness.  Extremities: without cyanosis, clubbing or edema. Good distal pulses bilateral  SKin: Warm, no lesions or rashes   Musculoskeletal: No deformities  Neuro: no focal signs  Ht 6\' 1"  (1.854 m)   EKG: Atrial fibrillation with T wave inversion inferior laterally

## 2014-02-28 NOTE — Assessment & Plan Note (Signed)
Continue Xarelto 

## 2014-02-28 NOTE — Assessment & Plan Note (Signed)
Controlled on diltiazem and Xarelto

## 2014-02-28 NOTE — Assessment & Plan Note (Signed)
Blood pressure is stable off the spiral lactone. Check bmet today.

## 2014-03-02 ENCOUNTER — Telehealth: Payer: Self-pay | Admitting: *Deleted

## 2014-03-02 DIAGNOSIS — E875 Hyperkalemia: Secondary | ICD-10-CM

## 2014-03-02 NOTE — Telephone Encounter (Signed)
lmtcb for lab results. number and hours provided

## 2014-03-02 NOTE — Telephone Encounter (Signed)
informed pt of lab results. pt showed understanding.

## 2014-03-03 ENCOUNTER — Other Ambulatory Visit: Payer: Self-pay

## 2014-03-03 NOTE — Telephone Encounter (Signed)
Last filled 10/14 with 5 refills--please advise

## 2014-03-07 NOTE — Telephone Encounter (Signed)
Left detailed msg on VM for pt to return call 

## 2014-03-07 NOTE — Telephone Encounter (Signed)
Is he going to continue to see me out here? If not he should have his cardiologist prescribe this until he can establish with a new PCP

## 2014-03-16 ENCOUNTER — Telehealth: Payer: Self-pay | Admitting: *Deleted

## 2014-03-16 ENCOUNTER — Other Ambulatory Visit (INDEPENDENT_AMBULATORY_CARE_PROVIDER_SITE_OTHER): Payer: 59

## 2014-03-16 DIAGNOSIS — E875 Hyperkalemia: Secondary | ICD-10-CM

## 2014-03-16 LAB — BASIC METABOLIC PANEL
BUN: 27 mg/dL — ABNORMAL HIGH (ref 6–23)
CO2: 30 mEq/L (ref 19–32)
CREATININE: 1.9 mg/dL — AB (ref 0.4–1.5)
Calcium: 9.3 mg/dL (ref 8.4–10.5)
Chloride: 103 mEq/L (ref 96–112)
GFR: 53.23 mL/min — AB (ref >60.00)
Glucose, Bld: 87 mg/dL (ref 70–99)
Potassium: 4 mEq/L (ref 3.5–5.1)
Sodium: 139 mEq/L (ref 135–145)

## 2014-03-16 NOTE — Telephone Encounter (Signed)
lmtcb for lab results. number and office hours provided

## 2014-03-17 ENCOUNTER — Other Ambulatory Visit: Payer: Self-pay

## 2014-03-17 NOTE — Telephone Encounter (Signed)
Pt did have an appt with Baltazar Apo in 12/2013--and has not had an appt with you since 12/2012--please advise

## 2014-03-18 ENCOUNTER — Other Ambulatory Visit: Payer: Self-pay | Admitting: *Deleted

## 2014-03-18 MED ORDER — DILTIAZEM HCL ER COATED BEADS 240 MG PO CP24: 240.0000 mg | ORAL_CAPSULE | Freq: Every day | ORAL | Status: DC

## 2014-03-22 ENCOUNTER — Ambulatory Visit: Payer: 59 | Admitting: Internal Medicine

## 2014-03-28 ENCOUNTER — Ambulatory Visit: Payer: 59 | Admitting: Cardiology

## 2014-03-28 ENCOUNTER — Ambulatory Visit (INDEPENDENT_AMBULATORY_CARE_PROVIDER_SITE_OTHER): Payer: 59 | Admitting: Internal Medicine

## 2014-03-28 ENCOUNTER — Encounter: Payer: Self-pay | Admitting: Internal Medicine

## 2014-03-28 VITALS — BP 144/98 | HR 70 | Temp 98.0°F | Wt 307.8 lb

## 2014-03-28 DIAGNOSIS — I639 Cerebral infarction, unspecified: Secondary | ICD-10-CM

## 2014-03-28 DIAGNOSIS — I4891 Unspecified atrial fibrillation: Secondary | ICD-10-CM

## 2014-03-28 DIAGNOSIS — I10 Essential (primary) hypertension: Secondary | ICD-10-CM

## 2014-03-28 DIAGNOSIS — Z8673 Personal history of transient ischemic attack (TIA), and cerebral infarction without residual deficits: Secondary | ICD-10-CM

## 2014-03-28 DIAGNOSIS — I6350 Cerebral infarction due to unspecified occlusion or stenosis of unspecified cerebral artery: Secondary | ICD-10-CM

## 2014-03-28 DIAGNOSIS — Z6841 Body Mass Index (BMI) 40.0 and over, adult: Secondary | ICD-10-CM

## 2014-03-28 MED ORDER — DILTIAZEM HCL ER COATED BEADS 240 MG PO CP24: 240.0000 mg | ORAL_CAPSULE | Freq: Every day | ORAL | Status: DC

## 2014-03-28 MED ORDER — OLMESARTAN MEDOXOMIL-HCTZ 40-25 MG PO TABS: 1.0000 | ORAL_TABLET | Freq: Every day | ORAL | Status: DC

## 2014-03-28 MED ORDER — CARVEDILOL 12.5 MG PO TABS: ORAL_TABLET | ORAL | Status: DC

## 2014-03-28 MED ORDER — XARELTO 20 MG PO TABS: ORAL_TABLET | ORAL | Status: DC

## 2014-03-28 NOTE — Progress Notes (Signed)
Pre visit review using our clinic review tool, if applicable. No additional management support is needed unless otherwise documented below in the visit note. 

## 2014-03-28 NOTE — Progress Notes (Signed)
Subjective:    Patient ID: Thomas [name], male    DOB: 10/25/1981, 33 y.o.   MRN: [id number]  HPI  Pt presents to the clinic today for medication refill. He wants to discuss changing his xarelto. He is not having any issues with it. He reports that it is not too expensive. He reports he occasionally has some blood in his mucous. He did see cardiology 02/28/14. They advised him to not come off his xarelto. He is not very happy about this.  Review of Systems      Past Medical History  Diagnosis Date  . Cardiomyopathy 11/28/2010  . Palpitation 11/28/2010  . Atrial fibrillation 10/29/2010  . Benign essential hypertension 12/15/2008  . Paroxysmal SVT (supraventricular tachycardia) 12/15/2008  . 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 Renal insufficiency 11/28/2010  . Overweight 08/23/2010  . Hematuria, unspecified 12/15/2008  . History of chicken pox     Current Outpatient Prescriptions  Medication Sig Dispense Refill  . carvedilol (COREG) 12.5 MG tablet TAKE 1 TABLET BY MOUTH 2 TIMES DAILY WITH A MEAL.  60 tablet  5  . diltiazem (CARDIZEM CD) 240 MG 24 hr capsule Take 1 capsule (240 mg total) by mouth daily.  30 capsule  0  . olmesartan-hydrochlorothiazide (BENICAR HCT) 40-25 MG per tablet Take 1 tablet by mouth daily.  30 tablet  2  . XARELTO 20 MG TABS tablet TAKE 1 TABLET BY MOUTH DAILY WITH SUPPER.  30 tablet  5   No current facility-administered medications for this visit.    No Known Allergies  Family History  Problem Relation Age of Onset  . Hypertension Mother   . Diabetes Mother   . Hypertension Father   . Glaucoma Father   . Hyperlipidemia Other     Parent    History   Social History  . Marital Status: Single    Spouse Name: N/A    Number of Children: N/A  . Years of Education: 14   Occupational History  . Behavioral Health   .      Social History Main Topics  . Smoking status: Never Smoker   . Smokeless tobacco: Never Used  . Alcohol Use: 0.6 oz/week    1 Shots of liquor per week     Comment: <1/day  . Drug Use: No  . Sexual Activity: Yes    Birth Control/ Protection: Condom   Other Topics Concern  . Not on file   Social History Narrative   Neither of his parents nor any siblings have any cardiac or heart rhythm issues. He has 2 grandparents that were on blood thinners but doesn't know why.      Regular exercise-no   Caffeine Use-yes     Constitutional: Denies fever, malaise, fatigue, headache or abrupt weight changes.  Respiratory: Denies difficulty breathing, shortness of breath, cough or sputum production.   Cardiovascular: Denies chest pain, chest tightness, palpitations or swelling in the hands or feet.  Neurological: Denies dizziness, difficulty with memory, difficulty with speech or problems with balance and coordination.   No other specific complaints in a complete review of systems (except as listed in HPI above).  Objective:   Physical Exam   BP 144/98  Pulse 70  Temp(Src) 98 F (36.7 C) (Oral)  Wt 307 lb 12 oz (139.594 kg)  SpO2 98% Wt Readings from Last 3 Encounters:  03/28/14 307  lb 12 oz (139.594 kg)  02/28/14 309 lb (140.161 kg)  03/01/13 306 lb 12.8 oz (139.164 kg)    General: Appears his stated age, obese but well developed, well nourished in NAD. Cardiovascular: Normal rate with irregular rhythm. S1,S2 noted.  No murmur, rubs or gallops noted. No JVD or BLE edema. No carotid bruits noted. Pulmonary/Chest: Normal effort and positive vesicular breath sounds. No respiratory distress. No wheezes, rales or ronchi noted.  Neurological: Alert and oriented. Cranial nerves II-XII intact. Coordination normal. +DTRs bilaterally.   BMET    Component Value Date/Time   NA 139 03/16/2014 1006   K 4.0 03/16/2014 1006   CL 103 03/16/2014 1006   CO2 30 03/16/2014 1006   GLUCOSE 87  03/16/2014 1006   BUN 27* 03/16/2014 1006   CREATININE 1.9* 03/16/2014 1006   CALCIUM 9.3 03/16/2014 1006   GFRNONAA 34* 12/18/2012 0625   GFRAA 39* 12/18/2012 0625    Lipid Panel     Component Value Date/Time   CHOL 140 12/21/2013 1621   TRIG 73.0 12/21/2013 1621   HDL 36.50* 12/21/2013 1621   CHOLHDL 4 12/21/2013 1621   VLDL 14.6 12/21/2013 1621   LDLCALC 89 12/21/2013 1621    CBC    Component Value Date/Time   WBC 4.4* 12/21/2013 1621   RBC 4.84 12/21/2013 1621   HGB 14.6 12/21/2013 1621   HCT 43.1 12/21/2013 1621   PLT 174.0 12/21/2013 1621   MCV 89.1 12/21/2013 1621   MCH 28.9 12/10/2012 0555   MCHC 33.8 12/21/2013 1621   RDW 13.9 12/21/2013 1621   LYMPHSABS 1.8 12/21/2013 1621   MONOABS 0.3 12/21/2013 1621   EOSABS 0.1 12/21/2013 1621   BASOSABS 0.0 12/21/2013 1621    Hgb A1C Lab Results  Component Value Date   HGBA1C 5.8* 12/08/2012        Assessment & Plan:

## 2014-03-28 NOTE — Assessment & Plan Note (Signed)
On xarelto He wants to stop it Advised him against this Medication refilled today

## 2014-03-28 NOTE — Assessment & Plan Note (Signed)
Continues to work on diet and exercise. 

## 2014-03-28 NOTE — Assessment & Plan Note (Signed)
Well controlled on coreg and benicar CMET reviewed Medication refilled today

## 2014-03-28 NOTE — Assessment & Plan Note (Signed)
Advised him not to stop his xarelto Reviewed his labs from 12/2013 He follows up with cardiology in 6 months

## 2014-03-28 NOTE — Patient Instructions (Addendum)

## 2014-03-29 ENCOUNTER — Telehealth: Payer: Self-pay | Admitting: Internal Medicine

## 2014-03-29 NOTE — Telephone Encounter (Signed)
Relevant patient education mailed to patient.  

## 2014-04-21 ENCOUNTER — Other Ambulatory Visit: Payer: Self-pay | Admitting: Internal Medicine

## 2014-09-20 ENCOUNTER — Ambulatory Visit: Payer: Self-pay | Admitting: Podiatry

## 2014-09-23 ENCOUNTER — Ambulatory Visit (INDEPENDENT_AMBULATORY_CARE_PROVIDER_SITE_OTHER): Payer: 59 | Admitting: Podiatry

## 2014-09-23 ENCOUNTER — Encounter: Payer: Self-pay | Admitting: Podiatry

## 2014-09-23 ENCOUNTER — Ambulatory Visit (INDEPENDENT_AMBULATORY_CARE_PROVIDER_SITE_OTHER): Payer: 59

## 2014-09-23 VITALS — BP 148/94 | HR 60 | Resp 16 | Ht 73.0 in | Wt 310.0 lb

## 2014-09-23 DIAGNOSIS — M79673 Pain in unspecified foot: Secondary | ICD-10-CM

## 2014-09-23 DIAGNOSIS — M21969 Unspecified acquired deformity of unspecified lower leg: Secondary | ICD-10-CM

## 2014-09-23 DIAGNOSIS — M21611 Bunion of right foot: Secondary | ICD-10-CM

## 2014-09-23 DIAGNOSIS — M2011 Hallux valgus (acquired), right foot: Secondary | ICD-10-CM

## 2014-09-23 NOTE — Progress Notes (Signed)
   Subjective:    Patient ID: Thomas [name], male    DOB: 09-10-1981, 33 y.o.   MRN: [id number]  HPI Comments: "I have flat feet but the right one hurts"  Thomas [name] presents the office today with complaints of right big toe joint pain. Patient is requesting a new pair of orthotics as he previously had some that helps alleviate his symptoms. He states that he has pain mostly in his right foot with weightbearing and after periods of activity. He states that certain shoe gear increases the symptoms. He complains of an achy pain for which he points to the right first MTPJ. He denies any specific injury or trauma to the area. No other complaints at this time  Foot Pain      Review of Systems  All other systems reviewed and are negative.      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 Decreased range of motion of the right first MTPJ and dorsiflexion. Mild tenderness directly over dorsomedial prominence right first metatarsal head. Overlying skin intact. No edema, erythema, increased warmth in the area. Decrease in medial arch height upon weightbearing b/l. Equinus b/l.  No open lesions. No calf pain with compression, swelling, warmth, erythema.       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 -X-rays were obtained and reviewed with the patient. -Conservative versus surgical treatment discussed including alternatives, risks, complications. -At this time the patient is requesting a new pair of orthotics. Patient was scanned and sent to [facility] labs. We'll call the patient once they are received. -Follow-up after orthotics made. In the meantime call the office if any questions, concerns, change in symptoms.

## 2014-09-25 ENCOUNTER — Encounter: Payer: Self-pay | Admitting: Podiatry

## 2014-10-11 ENCOUNTER — Telehealth: Payer: Self-pay | Admitting: *Deleted

## 2014-10-11 NOTE — Telephone Encounter (Signed)
"  Calling to check if custom orthotics have arrived yet.  Give me a call at your earliest convenience, thank you.

## 2014-10-12 ENCOUNTER — Encounter: Payer: Self-pay | Admitting: Podiatry

## 2014-10-12 ENCOUNTER — Ambulatory Visit: Payer: 59

## 2014-10-12 DIAGNOSIS — M21969 Unspecified acquired deformity of unspecified lower leg: Secondary | ICD-10-CM

## 2014-10-12 NOTE — Progress Notes (Signed)
   Subjective:    Patient ID: Thomas [name], male    DOB: 1981/09/06, 33 y.o.   MRN: [id number]  HPI  PUO AND GIVEN INSTRUCTION.  Review of Systems     Objective:   Physical Exam        Assessment & Plan:

## 2014-10-12 NOTE — Patient Instructions (Signed)

## 2014-10-17 ENCOUNTER — Encounter: Payer: Self-pay | Admitting: Cardiology

## 2014-10-17 ENCOUNTER — Ambulatory Visit (INDEPENDENT_AMBULATORY_CARE_PROVIDER_SITE_OTHER): Payer: 59 | Admitting: Cardiology

## 2014-10-17 VITALS — BP 136/84 | HR 71 | Ht 73.0 in | Wt 318.0 lb

## 2014-10-17 DIAGNOSIS — I429 Cardiomyopathy, unspecified: Secondary | ICD-10-CM

## 2014-10-17 DIAGNOSIS — I4891 Unspecified atrial fibrillation: Secondary | ICD-10-CM

## 2014-10-17 DIAGNOSIS — I10 Essential (primary) hypertension: Secondary | ICD-10-CM

## 2014-10-17 NOTE — Patient Instructions (Signed)
Pt. To be seen in the Atrial Fibb clinic with Vista Mink PA  Your physician recommends that you schedule a follow-up appointment in: as needed with Dr. Antoine Poche

## 2014-10-17 NOTE — Progress Notes (Signed)
HPI The patient presents for follow up of atrial fib, HTN and a CVA. He has been treated with TPA in past for his CV. He has had a reasonable recovery but still has some left-sided arm and hand numbness.  He did have some evidence of LVH on echocardiogram.  He was seen by our PA in the clinic this spring and was in atrial fib.  He has not felt any palpitations. He has had no chest pressure, neck or arm discomfort. He's had no weight gain or edema. He is not exercising as I have suggested. His creatinine is elevated but has been followed and stable.  No Known Allergies  Current Outpatient Prescriptions  Medication Sig Dispense Refill  . carvedilol (COREG) 12.5 MG tablet TAKE 1 TABLET BY MOUTH 2 TIMES DAILY WITH A MEAL. 60 tablet 5  . diltiazem (CARDIZEM CD) 240 MG 24 hr capsule Take 1 capsule (240 mg total) by mouth daily. 30 capsule 5  . olmesartan-hydrochlorothiazide (BENICAR HCT) 40-25 MG per tablet Take 1 tablet by mouth daily. 30 tablet 5  . XARELTO 20 MG TABS tablet TAKE 1 TABLET BY MOUTH DAILY WITH SUPPER. 30 tablet 5   No current facility-administered medications for this visit.    Past Medical History  Diagnosis Date  . Cardiomyopathy 11/28/2010  . Palpitation 11/28/2010  . Atrial fibrillation 10/29/2010  . Benign essential hypertension 12/15/2008  . Paroxysmal SVT (supraventricular tachycardia) 12/15/2008  . 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 Renal insufficiency 11/28/2010  . Overweight(278.02) 08/23/2010  . Hematuria, unspecified 12/15/2008  . History of chicken pox     No past surgical history on file.  ROS:  As stated in the HPI and negative for all other systems.  PHYSICAL EXAM BP 136/84 mmHg  Pulse 71  Ht 6\' 1"  (1.854 m)  Wt 318 lb (144.244 kg)  BMI 41.96 kg/m2 GENERAL:  Well appearing HEENT:  Pupils equal round and reactive, fundi not  visualized, oral mucosa unremarkable NECK:  No jugular venous distention, waveform within normal limits, carotid upstroke brisk and symmetric, no bruits, no thyromegaly LYMPHATICS:  No cervical, inguinal adenopathy LUNGS:  Clear to auscultation bilaterally BACK:  No CVA tenderness CHEST:  Unremarkable HEART:  PMI not displaced or sustained,S1 and S2 within normal limits, no S3, no S4, no clicks, no rubs, no murmurs ABD:  Flat, positive bowel sounds normal in frequency in pitch, no bruits, no rebound, no guarding, no midline pulsatile mass, no hepatomegaly, no splenomegaly EXT:  2 plus pulses throughout, no edema, no cyanosis no clubbing SKIN:  No rashes no nodules NEURO:  Cranial nerves II through XII grossly intact, mild weakness left upper extremity PSYCH:  Cognitively intact, oriented to person place and time  EKG:  Atrial fibrillation, rate 71, axis within normal limits, premature ectopic complexes, inferolateral T wave inversions unchanged from previous.  10/17/2014   ASSESSMENT AND PLAN  ATRIAL FIBRILLATION:  He does not feel his fibrillation.   I am going A 24-hour Holter to make sure he has good rate control. Since he is not symptomatic and he wouldn't come off anticoagulation and rate control is likely achievable I don't know that there is an advantage to antiarrhythmic therapy or ablation. However, he would like to discuss this with Dr. Johney FrameAllred and I will arrange this.   HTN:  His blood pressure is now well controlled on the  current regimen. I reviewed his most recent basic metabolic profile and his creatinine is stable.   Of note his creatinine clearance is 112 by CG and he continues the current dose of anticoagulation.  CKD:  As above.   OVERWEIGHT:  We have discussed this and I prescribed the [facility] Diet in the past.   VENTRICULAR HYPERTROPHY:  We will concentrate on hypertension control and weight loss. Of note the patient did have a sleep study that demonstrated only  minimal sleep apnea.  He did not need to be treated.  He was in atrial fib during that study in 2014.

## 2014-10-26 ENCOUNTER — Institutional Professional Consult (permissible substitution): Payer: 59 | Admitting: Internal Medicine

## 2014-11-23 ENCOUNTER — Other Ambulatory Visit: Payer: Self-pay | Admitting: Internal Medicine

## 2014-12-12 ENCOUNTER — Other Ambulatory Visit: Payer: Self-pay | Admitting: Internal Medicine

## 2015-01-26 ENCOUNTER — Other Ambulatory Visit: Payer: Self-pay | Admitting: Internal Medicine

## 2015-02-16 ENCOUNTER — Other Ambulatory Visit: Payer: Self-pay | Admitting: Internal Medicine

## 2015-02-16 NOTE — Telephone Encounter (Signed)
Is pt supposed to be taking this medication? Pt has upcoming appt 02/21/2014-because it is not on current list of meds--please advise

## 2015-02-16 NOTE — Telephone Encounter (Signed)
He should not be taking that and the [place]. They are the same drug.

## 2015-02-17 ENCOUNTER — Ambulatory Visit: Payer: 59 | Admitting: Podiatry

## 2015-02-20 ENCOUNTER — Other Ambulatory Visit: Payer: Self-pay | Admitting: Internal Medicine

## 2015-02-22 ENCOUNTER — Encounter (INDEPENDENT_AMBULATORY_CARE_PROVIDER_SITE_OTHER): Payer: Self-pay

## 2015-02-22 ENCOUNTER — Encounter: Payer: Self-pay | Admitting: Internal Medicine

## 2015-02-22 ENCOUNTER — Ambulatory Visit (INDEPENDENT_AMBULATORY_CARE_PROVIDER_SITE_OTHER): Payer: 59 | Admitting: Internal Medicine

## 2015-02-22 VITALS — BP 152/104 | HR 102 | Temp 98.3°F | Wt 317.0 lb

## 2015-02-22 DIAGNOSIS — N289 Disorder of kidney and ureter, unspecified: Secondary | ICD-10-CM

## 2015-02-22 DIAGNOSIS — I481 Persistent atrial fibrillation: Secondary | ICD-10-CM | POA: Diagnosis not present

## 2015-02-22 DIAGNOSIS — I10 Essential (primary) hypertension: Secondary | ICD-10-CM | POA: Diagnosis not present

## 2015-02-22 DIAGNOSIS — Z8673 Personal history of transient ischemic attack (TIA), and cerebral infarction without residual deficits: Secondary | ICD-10-CM

## 2015-02-22 DIAGNOSIS — I4819 Other persistent atrial fibrillation: Secondary | ICD-10-CM

## 2015-02-22 LAB — CBC
HCT: 46.1 % (ref 39.0–52.0)
Hemoglobin: 15.6 g/dL (ref 13.0–17.0)
MCHC: 33.8 g/dL (ref 30.0–36.0)
MCV: 88 fl (ref 78.0–100.0)
PLATELETS: 170 10*3/uL (ref 150.0–400.0)
RBC: 5.24 Mil/uL (ref 4.22–5.81)
RDW: 14.6 % (ref 11.5–15.5)
WBC: 3.5 10*3/uL — ABNORMAL LOW (ref 4.0–10.5)

## 2015-02-22 LAB — COMPREHENSIVE METABOLIC PANEL
ALT: 27 U/L (ref 0–53)
AST: 34 U/L (ref 0–37)
Albumin: 4 g/dL (ref 3.5–5.2)
Alkaline Phosphatase: 39 U/L (ref 39–117)
BUN: 20 mg/dL (ref 6–23)
CALCIUM: 9.5 mg/dL (ref 8.4–10.5)
CO2: 32 meq/L (ref 19–32)
CREATININE: 1.62 mg/dL — AB (ref 0.40–1.50)
Chloride: 103 mEq/L (ref 96–112)
GFR: 63.22 mL/min (ref >60.00)
Glucose, Bld: 97 mg/dL (ref 70–99)
POTASSIUM: 4.4 meq/L (ref 3.5–5.1)
Sodium: 136 mEq/L (ref 135–145)
Total Bilirubin: 0.9 mg/dL (ref 0.2–1.2)
Total Protein: 7.5 g/dL (ref 6.0–8.3)

## 2015-02-22 LAB — LIPID PANEL
CHOLESTEROL: 147 mg/dL (ref 0–200)
HDL: 41.2 mg/dL (ref >39.00)
LDL Cholesterol: 89 mg/dL (ref 0–99)
NonHDL: 105.8
TRIGLYCERIDES: 82 mg/dL (ref 0.0–149.0)
Total CHOL/HDL Ratio: 4
VLDL: 16.4 mg/dL (ref 0.0–40.0)

## 2015-02-22 LAB — HEMOGLOBIN A1C: Hgb A1c MFr Bld: 5.7 % (ref 4.6–6.5)

## 2015-02-22 MED ORDER — CARVEDILOL 12.5 MG PO TABS: 12.5000 mg | ORAL_TABLET | Freq: Two times a day (BID) | ORAL | Status: DC

## 2015-02-22 MED ORDER — DILTIAZEM HCL ER COATED BEADS 240 MG PO CP24: 240.0000 mg | ORAL_CAPSULE | Freq: Every day | ORAL | Status: DC

## 2015-02-22 MED ORDER — XARELTO 20 MG PO TABS: ORAL_TABLET | ORAL | Status: DC

## 2015-02-22 MED ORDER — OLMESARTAN MEDOXOMIL-HCTZ 40-25 MG PO TABS: 1.0000 | ORAL_TABLET | Freq: Every day | ORAL | Status: DC

## 2015-02-22 NOTE — Progress Notes (Signed)
Subjective:    Patient ID: Thomas Quant., male    DOB: 1981/02/19, 34 y.o.   MRN: [id number]  HPI  Pt presents to the clinic today for 6 month follow up of chronic medical conditoins.  Afib: Denies any recent palpitations or shortness of breath. He reports he forgot to take his medications today. His BP is 152/104 with a HR of 102. He does follow with cardiology, Dr. Antoine Poche. He last saw him 10/2014.  CVA with hemipalegia of dominent side: He is no longer working with PT. He continues to experience numbness in his left hand.  HTN: He is noncompliant with his medication regimen. He is not picking up his medications when they are renewed and he reports he is so busy that he often forgets to take his medication. His BP today is 152/104. He denies chest pain or shortness of breath.  Obesity: He has gained 10 lbs over the last year. His BMI is 41. He does not adhere to any specific diet regimen. He does not exercise.  CRI: Last creatinine 1.9, BUN 27 and GFR of 53.23.  Review of Systems     Past Medical History  Diagnosis Date  . Cardiomyopathy 11/28/2010  . Palpitation 11/28/2010  . Atrial fibrillation 10/29/2010  . Benign essential hypertension 12/15/2008  . Paroxysmal SVT (supraventricular tachycardia) 12/15/2008  . 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 Renal insufficiency 11/28/2010  . Overweight(278.02) 08/23/2010  . Hematuria, unspecified 12/15/2008  . History of chicken pox     Current Outpatient Prescriptions  Medication Sig Dispense Refill  . carvedilol (COREG) 12.5 MG tablet TAKE 1 TABLET BY MOUTH TWICE A DAY WITH A MEAL 60 tablet 1  . diltiazem (CARDIZEM CD) 240 MG 24 hr capsule Take 1 capsule (240 mg total) by mouth daily. 30 capsule 1  . olmesartan-hydrochlorothiazide (BENICAR HCT) 40-25 MG per tablet Take 1 tablet by mouth daily. NEEDS  OFFICE VISIT FOR FURTHER REFILLS 30 tablet 1  . XARELTO 20 MG TABS tablet TAKE 1 TABLET BY MOUTH DAILY WITH SUPPER. 30 tablet 5   No current facility-administered medications for this visit.    No Known Allergies  Family History  Problem Relation Age of Onset  . Hypertension Mother   . Diabetes Mother   . Hypertension Father   . Glaucoma Father   . Hyperlipidemia Other     Parent    History   Social History  . Marital Status: Single    Spouse Name: N/A  . Number of Children: N/A  . Years of Education: 14   Occupational History  . Behavioral Health   .     Social History Main Topics  . Smoking status: Never Smoker   . Smokeless tobacco: Never Used  . Alcohol Use: 0.6 oz/week    1 Shots of liquor per week     Comment: <1/day  . Drug Use: No  . Sexual Activity: Yes    Birth Control/ Protection: Condom   Other Topics Concern  . Not on file   Social History Narrative   Neither of his parents nor any siblings have any cardiac or heart rhythm issues. He has 2 grandparents that were on blood thinners but doesn't know why.      Regular exercise-no   Caffeine Use-yes     Constitutional: Denies fever, malaise, fatigue, headache or abrupt weight changes.  Respiratory:  Denies difficulty breathing, shortness of breath, cough or sputum production.   Cardiovascular: Denies chest pain, chest tightness, palpitations or swelling in the hands or feet.  Gastrointestinal: Denies abdominal pain, bloating, constipation, diarrhea or blood in the stool.  Skin: Denies redness, rashes, lesions or ulcercations.  Neurological: Denies dizziness, difficulty with memory, difficulty with speech or problems with balance and coordination.   No other specific complaints in a complete review of systems (except as listed in HPI above).     Objective:   Physical Exam   BP 152/104 mmHg  Pulse 102  Temp(Src) 98.3 F (36.8 C) (Oral)  Wt 317 lb (143.79 kg)  SpO2 98% Wt Readings from Last 3  Encounters:  02/22/15 317 lb (143.79 kg)  10/17/14 318 lb (144.244 kg)  09/23/14 310 lb (140.615 kg)    General: Appears his stated age,  Obese, in NAD. Skin: Warm, dry and intact. No rashes, lesions or ulcerations noted. Neck: Neck supple, trachea midline. No masses, lumps or thyromegaly present.  Cardiovascular: Tachycardic with irregular rhythm. S1,S2 noted.  No murmur, rubs or gallops noted. No JVD or BLE edema. No carotid bruits noted. Pulmonary/Chest: Normal effort and positive vesicular breath sounds. No respiratory distress. No wheezes, rales or ronchi noted.  Abdomen: Soft and nontender. Normal bowel sounds, no bruits noted. No distention or masses noted. Liver, spleen and kidneys non palpable. Neurological: Alert and oriented. C  BMET    Component Value Date/Time   NA 139 03/16/2014 1006   K 4.0 03/16/2014 1006   CL 103 03/16/2014 1006   CO2 30 03/16/2014 1006   GLUCOSE 87 03/16/2014 1006   BUN 27* 03/16/2014 1006   CREATININE 1.9* 03/16/2014 1006   CALCIUM 9.3 03/16/2014 1006   GFRNONAA 34* 12/18/2012 0625   GFRAA 39* 12/18/2012 0625    Lipid Panel     Component Value Date/Time   CHOL 140 12/21/2013 1621   TRIG 73.0 12/21/2013 1621   HDL 36.50* 12/21/2013 1621   CHOLHDL 4 12/21/2013 1621   VLDL 14.6 12/21/2013 1621   LDLCALC 89 12/21/2013 1621    CBC    Component Value Date/Time   WBC 4.4* 12/21/2013 1621   RBC 4.84 12/21/2013 1621   HGB 14.6 12/21/2013 1621   HCT 43.1 12/21/2013 1621   PLT 174.0 12/21/2013 1621   MCV 89.1 12/21/2013 1621   MCH 28.9 12/10/2012 0555   MCHC 33.8 12/21/2013 1621   RDW 13.9 12/21/2013 1621   LYMPHSABS 1.8 12/21/2013 1621   MONOABS 0.3 12/21/2013 1621   EOSABS 0.1 12/21/2013 1621   BASOSABS 0.0 12/21/2013 1621    Hgb A1C Lab Results  Component Value Date   HGBA1C 5.8* 12/08/2012        Assessment & Plan:

## 2015-02-22 NOTE — Assessment & Plan Note (Signed)
He is irregular and tachycardic today but he has not taken his carvedilol or cardizem Discussed the importance of medication compliance and risk of non adherance He is on xarelto for anticoagulation Continue carvedilol and cardizem Will check CBC and CMET today Medications refilled

## 2015-02-22 NOTE — Assessment & Plan Note (Signed)
Non compliant with diet and exercise Discussed how is weight is affecting his overall health

## 2015-02-22 NOTE — Progress Notes (Signed)
Pre visit review using our clinic review tool, if applicable. No additional management support is needed unless otherwise documented below in the visit note. 

## 2015-02-22 NOTE — Patient Instructions (Signed)
Fat and Cholesterol Control Diet Fat and cholesterol levels in your blood and organs are influenced by your diet. High levels of fat and cholesterol may lead to diseases of the heart, small and large blood vessels, gallbladder, liver, and pancreas. CONTROLLING FAT AND CHOLESTEROL WITH DIET Although exercise and lifestyle factors are important, your diet is key. That is because certain foods are known to raise cholesterol and others to lower it. The goal is to balance foods for their effect on cholesterol and more importantly, to replace saturated and trans fat with other types of fat, such as monounsaturated fat, polyunsaturated fat, and omega-3 fatty acids. On average, a person should consume no more than 15 to 17 g of saturated fat daily. Saturated and trans fats are considered "bad" fats, and they will raise LDL cholesterol. Saturated fats are primarily found in animal products such as meats, butter, and cream. However, that does not mean you need to give up all your favorite foods. Today, there are good tasting, low-fat, low-cholesterol substitutes for most of the things you like to eat. Choose low-fat or nonfat alternatives. Choose round or loin cuts of red meat. These types of cuts are lowest in fat and cholesterol. Chicken (without the skin), fish, veal, and ground turkey breast are great choices. Eliminate fatty meats, such as hot dogs and salami. Even shellfish have little or no saturated fat. Have a 3 oz (85 g) portion when you eat lean meat, poultry, or fish. Trans fats are also called "partially hydrogenated oils." They are oils that have been scientifically manipulated so that they are solid at room temperature resulting in a longer shelf life and improved taste and texture of foods in which they are added. Trans fats are found in stick margarine, some tub margarines, cookies, crackers, and baked goods.  When baking and cooking, oils are a great substitute for butter. The monounsaturated oils are  especially beneficial since it is believed they lower LDL and raise HDL. The oils you should avoid entirely are saturated tropical oils, such as coconut and palm.  Remember to eat a lot from food groups that are naturally free of saturated and trans fat, including fish, fruit, vegetables, beans, grains (barley, rice, couscous, bulgur wheat), and pasta (without cream sauces).  IDENTIFYING FOODS THAT LOWER FAT AND CHOLESTEROL  Soluble fiber may lower your cholesterol. This type of fiber is found in fruits such as apples, vegetables such as broccoli, potatoes, and carrots, legumes such as beans, peas, and lentils, and grains such as barley. Foods fortified with plant sterols (phytosterol) may also lower cholesterol. You should eat at least 2 g per day of these foods for a cholesterol lowering effect.  Read package labels to identify low-saturated fats, trans fat free, and low-fat foods at the supermarket. Select cheeses that have only 2 to 3 g saturated fat per ounce. Use a heart-healthy tub margarine that is free of trans fats or partially hydrogenated oil. When buying baked goods (cookies, crackers), avoid partially hydrogenated oils. Breads and muffins should be made from whole grains (whole-wheat or whole oat flour, instead of "flour" or "enriched flour"). Buy non-creamy canned soups with reduced salt and no added fats.  FOOD PREPARATION TECHNIQUES  Never deep-fry. If you must fry, either stir-fry, which uses very little fat, or use non-stick cooking sprays. When possible, broil, bake, or roast meats, and steam vegetables. Instead of putting butter or margarine on vegetables, use lemon and herbs, applesauce, and cinnamon (for squash and sweet potatoes). Use nonfat   yogurt, salsa, and low-fat dressings for salads.  LOW-SATURATED FAT / LOW-FAT FOOD SUBSTITUTES Meats / Saturated Fat (g)  Avoid: Steak, marbled (3 oz/85 g) / 11 g  Choose: Steak, lean (3 oz/85 g) / 4 g  Avoid: Hamburger (3 oz/85 g) / 7  g  Choose: Hamburger, lean (3 oz/85 g) / 5 g  Avoid: Ham (3 oz/85 g) / 6 g  Choose: Ham, lean cut (3 oz/85 g) / 2.4 g  Avoid: Chicken, with skin, dark meat (3 oz/85 g) / 4 g  Choose: Chicken, skin removed, dark meat (3 oz/85 g) / 2 g  Avoid: Chicken, with skin, light meat (3 oz/85 g) / 2.5 g  Choose: Chicken, skin removed, light meat (3 oz/85 g) / 1 g Dairy / Saturated Fat (g)  Avoid: Whole milk (1 cup) / 5 g  Choose: Low-fat milk, 2% (1 cup) / 3 g  Choose: Low-fat milk, 1% (1 cup) / 1.5 g  Choose: Skim milk (1 cup) / 0.3 g  Avoid: Hard cheese (1 oz/28 g) / 6 g  Choose: Skim milk cheese (1 oz/28 g) / 2 to 3 g  Avoid: Cottage cheese, 4% fat (1 cup) / 6.5 g  Choose: Low-fat cottage cheese, 1% fat (1 cup) / 1.5 g  Avoid: Ice cream (1 cup) / 9 g  Choose: Sherbet (1 cup) / 2.5 g  Choose: Nonfat frozen yogurt (1 cup) / 0.3 g  Choose: Frozen fruit bar / trace  Avoid: Whipped cream (1 tbs) / 3.5 g  Choose: Nondairy whipped topping (1 tbs) / 1 g Condiments / Saturated Fat (g)  Avoid: Mayonnaise (1 tbs) / 2 g  Choose: Low-fat mayonnaise (1 tbs) / 1 g  Avoid: Butter (1 tbs) / 7 g  Choose: Extra light margarine (1 tbs) / 1 g  Avoid: Coconut oil (1 tbs) / 11.8 g  Choose: Olive oil (1 tbs) / 1.8 g  Choose: Corn oil (1 tbs) / 1.7 g  Choose: Safflower oil (1 tbs) / 1.2 g  Choose: Sunflower oil (1 tbs) / 1.4 g  Choose: Soybean oil (1 tbs) / 2.4 g  Choose: Canola oil (1 tbs) / 1 g Document Released: 11/18/2005 Document Revised: 03/15/2013 Document Reviewed: 02/16/2014 Exit[employer] Patient Information 2015 [employer]. This information is not intended to replace advice given to you by your health care provider. Make sure you discuss any questions you have with your health care provider.  

## 2015-02-22 NOTE — Assessment & Plan Note (Signed)
Last BMET reviewed  Will repeat BMET today

## 2015-02-22 NOTE — Assessment & Plan Note (Signed)
He continues to have numbness in the left hand Will continue to monitor Will check Lipid profile today Continue Xarelto for anticoagulation

## 2015-02-22 NOTE — Assessment & Plan Note (Signed)
He is hypertensive today but he has not been taking his benicar  Discussed the importance of medication compliance and risk of non adherance Continue benicar at this time Will check CBC and CMET today Medications refilled

## 2015-02-23 ENCOUNTER — Telehealth: Payer: Self-pay | Admitting: Internal Medicine

## 2015-02-23 NOTE — Telephone Encounter (Signed)
emmi emailed °

## 2015-04-24 ENCOUNTER — Other Ambulatory Visit: Payer: Self-pay | Admitting: Internal Medicine

## 2015-05-04 ENCOUNTER — Other Ambulatory Visit: Payer: Self-pay | Admitting: Internal Medicine

## 2015-12-20 MED FILL — XARELTO 20 MG TABLET: 20 | 30 days supply | Qty: 30 | Fill #1

## 2015-12-20 MED FILL — CARTIA XT 240 MG CAPSULE SA: 240 | 30 days supply | Qty: 30 | Fill #5

## 2015-12-20 MED FILL — OLMESARTAN-HCTZ 40-25 MG TA: 40-25 | 30 days supply | Qty: 30 | Fill #1

## 2016-01-30 ENCOUNTER — Other Ambulatory Visit: Payer: Self-pay | Admitting: Internal Medicine

## 2016-01-30 MED FILL — CARTIA XT 240 MG CAPSULE SA: 240 | 30 days supply | Qty: 30 | Fill #0

## 2016-01-30 MED FILL — OLMESARTAN-HCTZ 40-25 MG TA: 40-25 | 30 days supply | Qty: 30 | Fill #2

## 2016-02-12 ENCOUNTER — Encounter: Payer: Self-pay | Admitting: Podiatry

## 2016-02-12 ENCOUNTER — Ambulatory Visit (INDEPENDENT_AMBULATORY_CARE_PROVIDER_SITE_OTHER): Payer: 59 | Admitting: Podiatry

## 2016-02-12 VITALS — BP 182/121 | HR 62 | Resp 17

## 2016-02-12 DIAGNOSIS — M205X1 Other deformities of toe(s) (acquired), right foot: Secondary | ICD-10-CM | POA: Diagnosis not present

## 2016-02-12 DIAGNOSIS — M779 Enthesopathy, unspecified: Secondary | ICD-10-CM | POA: Diagnosis not present

## 2016-02-12 DIAGNOSIS — M79673 Pain in unspecified foot: Secondary | ICD-10-CM

## 2016-02-12 DIAGNOSIS — M21611 Bunion of right foot: Secondary | ICD-10-CM

## 2016-02-12 NOTE — Progress Notes (Signed)
Patient ID: Thomas Quant., male   DOB: June 13, 1981, 35 y.o.   MRN: [id number]  Subjective: Renal male presents the office today requesting new orthotics. He states that he has a bunion he gets pain to his right big toe joint as well. He was a orthotics on a daily basis and his are worn out and he is likely new pair. He's had no recent injury or trauma. His been no changes since last upon. Denies any systemic complaints such as fevers, chills, nausea, vomiting. No acute changes since last appointment, and no other complaints at this time.   Objective: AAO x3, NAD DP/PT pulses palpable bilaterally, CRT less than 3 seconds Protective sensation intact with Simms Weinstein monofilament, vibratory sensation intact There is a decrease in medial arch height upon weightbearing. 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 There is a decrease in first MTPJ range of motion particularly in dorsiflexion. There is no edema, erythema, increase in warmth. There is no area pinpoint tenderness or pain vibratory sensation.  MMT 5/5, ROM WNL. No edema, erythema, increase in warmth to bilateral lower extremities.  No open lesions or pre-ulcerative lesions.  No pain with calf compression, swelling, warmth, erythema  Assessment: 35 year old male with hallux limitus/capsulitis, HAV, flatfoot  Plan: -All treatment options discussed with the patient including all alternatives, risks, complications.  -Etiology of symptoms were discussed -This time the orthotics today. He was scanned for orthotics were sent to [facility] labs. We will do a first ray cut out the right side. -Patient encouraged to call the office with any questions, concerns, change in symptoms.   Thomas [name], DPM

## 2016-02-22 DIAGNOSIS — H40013 Open angle with borderline findings, low risk, bilateral: Secondary | ICD-10-CM | POA: Diagnosis not present

## 2016-02-22 DIAGNOSIS — H52223 Regular astigmatism, bilateral: Secondary | ICD-10-CM | POA: Diagnosis not present

## 2016-02-22 DIAGNOSIS — Z83511 Family history of glaucoma: Secondary | ICD-10-CM | POA: Diagnosis not present

## 2016-02-22 DIAGNOSIS — H5213 Myopia, bilateral: Secondary | ICD-10-CM | POA: Diagnosis not present

## 2016-02-22 DIAGNOSIS — H40053 Ocular hypertension, bilateral: Secondary | ICD-10-CM | POA: Diagnosis not present

## 2016-03-04 ENCOUNTER — Other Ambulatory Visit: Payer: Self-pay | Admitting: Internal Medicine

## 2016-03-04 MED FILL — CARTIA XT 240 MG CAPSULE: 240 | 30 days supply | Qty: 30 | Fill #1

## 2016-03-05 ENCOUNTER — Ambulatory Visit: Payer: 59 | Admitting: *Deleted

## 2016-03-05 DIAGNOSIS — M79673 Pain in unspecified foot: Secondary | ICD-10-CM

## 2016-03-05 NOTE — Telephone Encounter (Signed)
Last filled 01/2015 w/ 5 refills--pt has upcoming CPE appt--please advise if okay to continue and fill to appt

## 2016-03-05 NOTE — Progress Notes (Signed)
Patient ID: Thomas Quant., male   DOB: [birth year], 35 y.o.   MRN: [id number] Patient presents for orthotic pick up.  Verbal and written break in and wear instructions given.  Patient will follow up in 4 weeks if symptoms worsen or fail to improve.

## 2016-03-05 NOTE — Patient Instructions (Signed)

## 2016-03-05 NOTE — Telephone Encounter (Signed)
Can you call and see if he has continued to take this, he should have run out in September

## 2016-03-06 MED FILL — XARELTO 20 MG TABLET: 20 | 30 days supply | Qty: 30 | Fill #0

## 2016-03-06 NOTE — Telephone Encounter (Signed)
Sent electronically 

## 2016-03-06 NOTE — Telephone Encounter (Signed)
Pt states he has been taking medication, and denies getting filled elsewhere--please advise--pt states he only has 5 pills left

## 2016-03-14 ENCOUNTER — Other Ambulatory Visit: Payer: Self-pay | Admitting: Internal Medicine

## 2016-03-14 MED FILL — OLMESARTAN-HCTZ 40-25 MG TA: 40-25 | 30 days supply | Qty: 30 | Fill #0

## 2016-04-10 ENCOUNTER — Other Ambulatory Visit: Payer: Self-pay | Admitting: Internal Medicine

## 2016-04-10 MED FILL — CARTIA XT 240 MG CAPSULE: 240 | 30 days supply | Qty: 30 | Fill #0

## 2016-04-19 ENCOUNTER — Other Ambulatory Visit: Payer: Self-pay | Admitting: Internal Medicine

## 2016-04-19 MED FILL — OLMESARTAN-HCTZ 40-25 MG TA: 40-25 | 30 days supply | Qty: 30 | Fill #1

## 2016-04-19 MED FILL — CARVEDILOL 12.5 MG TABLET: 12.5 | 30 days supply | Qty: 60 | Fill #0

## 2016-04-19 NOTE — Telephone Encounter (Signed)
This was last filled 01/2015 with 5 refills---please advise if pt is to continue---pt has upcoming appt 05/2016

## 2016-04-19 NOTE — Telephone Encounter (Signed)
Yes should continue

## 2016-05-16 MED FILL — CARTIA XT 240 MG CAPSULE: 240 | 30 days supply | Qty: 30 | Fill #1

## 2016-05-21 ENCOUNTER — Encounter: Payer: Self-pay | Admitting: Internal Medicine

## 2016-05-21 ENCOUNTER — Ambulatory Visit (INDEPENDENT_AMBULATORY_CARE_PROVIDER_SITE_OTHER): Payer: 59 | Admitting: Internal Medicine

## 2016-05-21 VITALS — BP 148/100 | HR 71 | Temp 98.1°F | Ht 72.5 in | Wt 327.0 lb

## 2016-05-21 DIAGNOSIS — I482 Chronic atrial fibrillation, unspecified: Secondary | ICD-10-CM

## 2016-05-21 DIAGNOSIS — Z0000 Encounter for general adult medical examination without abnormal findings: Secondary | ICD-10-CM

## 2016-05-21 DIAGNOSIS — Z0001 Encounter for general adult medical examination with abnormal findings: Secondary | ICD-10-CM

## 2016-05-21 DIAGNOSIS — N289 Disorder of kidney and ureter, unspecified: Secondary | ICD-10-CM | POA: Diagnosis not present

## 2016-05-21 DIAGNOSIS — I10 Essential (primary) hypertension: Secondary | ICD-10-CM

## 2016-05-21 DIAGNOSIS — I4891 Unspecified atrial fibrillation: Secondary | ICD-10-CM | POA: Diagnosis not present

## 2016-05-21 DIAGNOSIS — Z9114 Patient's other noncompliance with medication regimen: Secondary | ICD-10-CM

## 2016-05-21 DIAGNOSIS — Z6841 Body Mass Index (BMI) 40.0 and over, adult: Secondary | ICD-10-CM

## 2016-05-21 DIAGNOSIS — I69359 Hemiplegia and hemiparesis following cerebral infarction affecting unspecified side: Secondary | ICD-10-CM

## 2016-05-21 LAB — COMPREHENSIVE METABOLIC PANEL
ALT: 23 U/L (ref 0–53)
AST: 27 U/L (ref 0–37)
Albumin: 4.2 g/dL (ref 3.5–5.2)
Alkaline Phosphatase: 38 U/L — ABNORMAL LOW (ref 39–117)
BILIRUBIN TOTAL: 0.6 mg/dL (ref 0.2–1.2)
BUN: 26 mg/dL — ABNORMAL HIGH (ref 6–23)
CALCIUM: 10.1 mg/dL (ref 8.4–10.5)
CO2: 30 meq/L (ref 19–32)
CREATININE: 1.76 mg/dL — AB (ref 0.40–1.50)
Chloride: 103 mEq/L (ref 96–112)
GFR: 57.03 mL/min — ABNORMAL LOW (ref >60.00)
GLUCOSE: 98 mg/dL (ref 70–99)
Potassium: 4.4 mEq/L (ref 3.5–5.1)
Sodium: 138 mEq/L (ref 135–145)
TOTAL PROTEIN: 8 g/dL (ref 6.0–8.3)

## 2016-05-21 LAB — CBC
HCT: 46.2 % (ref 39.0–52.0)
Hemoglobin: 15.3 g/dL (ref 13.0–17.0)
MCHC: 33.2 g/dL (ref 30.0–36.0)
MCV: 87.8 fl (ref 78.0–100.0)
PLATELETS: 165 10*3/uL (ref 150.0–400.0)
RBC: 5.26 Mil/uL (ref 4.22–5.81)
RDW: 14.5 % (ref 11.5–15.5)
WBC: 4.8 10*3/uL (ref 4.0–10.5)

## 2016-05-21 LAB — LIPID PANEL
Cholesterol: 154 mg/dL (ref 0–200)
HDL: 39.1 mg/dL (ref >39.00)
LDL CALC: 96 mg/dL (ref 0–99)
NonHDL: 114.7
Total CHOL/HDL Ratio: 4
Triglycerides: 96 mg/dL (ref 0.0–149.0)
VLDL: 19.2 mg/dL (ref 0.0–40.0)

## 2016-05-21 LAB — HEMOGLOBIN A1C: Hgb A1c MFr Bld: 5.8 % (ref 4.6–6.5)

## 2016-05-21 MED ORDER — OLMESARTAN MEDOXOMIL-HCTZ 40-25 MG PO TABS: 1.0000 | ORAL_TABLET | Freq: Every day | ORAL | Status: DC

## 2016-05-21 MED ORDER — XARELTO 20 MG PO TABS: 20.0000 mg | ORAL_TABLET | Freq: Every day | ORAL | Status: DC

## 2016-05-21 MED ORDER — DILTIAZEM HCL ER COATED BEADS 240 MG PO CP24: 240.0000 mg | ORAL_CAPSULE | Freq: Every day | ORAL | Status: DC

## 2016-05-21 MED FILL — XARELTO 20 MG TABLET: 20 | 90 days supply | Qty: 90 | Fill #0

## 2016-05-21 MED FILL — OLMESARTAN-HCTZ 40-25 MG TA: 40-25 | 90 days supply | Qty: 90 | Fill #0

## 2016-05-21 NOTE — Assessment & Plan Note (Signed)
Encouraged him to work on diet and exercise 

## 2016-05-21 NOTE — Assessment & Plan Note (Signed)
CMET today Discussed the importance of good BP control

## 2016-05-21 NOTE — Assessment & Plan Note (Addendum)
Continue Cardizem, Carvedilol and Xarelto- refilled today Discussed the importance of medication compliance

## 2016-05-21 NOTE — Assessment & Plan Note (Signed)
Improved  Will monitor

## 2016-05-21 NOTE — Addendum Note (Signed)
Addended by: Alvina Chou on: 05/21/2016 08:50 AM   Modules accepted: Kipp Brood

## 2016-05-21 NOTE — Progress Notes (Signed)
Pre visit review using our clinic review tool, if applicable. No additional management support is needed unless otherwise documented below in the visit note. 

## 2016-05-21 NOTE — Progress Notes (Signed)
Subjective:    Patient ID: Thomas Quant., male    DOB: Dec 07, 1980, 35 y.o.   MRN: [id number]  HPI  Pt presents to the clinic today for his annual exam. He is also due for follow up of chronic conditions.  Afib: Denies any recent palpitations or shortness of breath. His BP is 152/104 with a HR of 102. He does not follow with cardiology anymore. He last saw Dr. Virgina Jock 10/2014.  CVA with hemipalegia of dominent side: He is no longer working with PT. He continues to experience numbness in his left hand.  HTN: He is noncompliant with his medication regimen. He only takes his BP medication 4 days per week. His BP today is 148/100. He denies chest pain or shortness of breath.  Obesity: He has gained another 10 lbs over the last year. His BMI is 43.72. He reports he has started going to the gym and he cut sugar out of his diet.  CRI: Last creatinine 1.62, BUN 20 and GFR of 63.22. He does not follow with nephrology.  Flu: 09/2015 Tetanus: 2010 Dentist: as needed  Diet: He does eat meat. He consumes fruits and veggies daily. He occasional eats fried foods. He drinks mostly water. Exercise: He goes to the gym 3 days per week for 45 minutes.  Review of Systems      Past Medical History  Diagnosis Date  . Cardiomyopathy 11/28/2010  . Palpitation 11/28/2010  . Atrial fibrillation (HCC) 10/29/2010  . Benign essential hypertension 12/15/2008  . Paroxysmal SVT (supraventricular tachycardia) (HCC) 12/15/2008  . 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 Renal insufficiency 11/28/2010  . Overweight(278.02) 08/23/2010  . Hematuria, unspecified 12/15/2008  . History of chicken pox     Current Outpatient Prescriptions  Medication Sig Dispense Refill  . carvedilol (COREG) 12.5 MG tablet TAKE 1 TABLET BY MOUTH TWICE DAILY WITH MEALS 60 tablet 5  . diltiazem (CARTIA XT) 240  MG 24 hr capsule Take 1 capsule (240 mg total) by mouth daily. 30 capsule 1  . olmesartan-hydrochlorothiazide (BENICAR HCT) 40-25 MG tablet TAKE 1 TABLET BY MOUTH ONCE DAILY 30 tablet 2  . XARELTO 20 MG TABS tablet TAKE 1 TABLET BY MOUTH ONCE DAILY WITH SUPPER 30 tablet 2   No current facility-administered medications for this visit.    No Known Allergies  Family History  Problem Relation Age of Onset  . Hypertension Mother   . Diabetes Mother   . Hypertension Father   . Glaucoma Father   . Hyperlipidemia Other     Parent    Social History   Social History  . Marital Status: Single    Spouse Name: N/A  . Number of Children: N/A  . Years of Education: 14   Occupational History  . Behavioral Health   .     Social History Main Topics  . Smoking status: Never Smoker   . Smokeless tobacco: Never Used  . Alcohol Use: 0.6 oz/week    1 Shots of liquor per week     Comment: <1/day  . Drug Use: No  . Sexual Activity: Yes    Birth Control/ Protection: Condom   Other Topics Concern  . Not on file   Social History Narrative   Neither of his parents nor any siblings have any cardiac or heart rhythm issues. He has 2 grandparents that were on blood thinners  but doesn't know why.      Regular exercise-no   Caffeine Use-yes     Constitutional: Pt reports weight gain. Denies fever, malaise, fatigue, headache.  HEENT: Denies eye pain, eye redness, ear pain, ringing in the ears, wax buildup, runny nose, nasal congestion, bloody nose, or sore throat. Respiratory: Denies difficulty breathing, shortness of breath, cough or sputum production.   Cardiovascular: Denies chest pain, chest tightness, palpitations or swelling in the hands or feet.  Gastrointestinal: Denies abdominal pain, bloating, constipation, diarrhea or blood in the stool.  GU: Denies urgency, frequency, pain with urination, burning sensation, blood in urine, odor or discharge. Musculoskeletal: Denies decrease in range  of motion, difficulty with gait, muscle pain or joint pain and swelling.  Skin: Pt reports rash around bilateral ankles. Denies redness, lesions or ulcercations.  Neurological: Denies dizziness, difficulty with memory, difficulty with speech or problems with balance and coordination.  Psych: Denies anxiety, depression, SI/HI.  No other specific complaints in a complete review of systems (except as listed in HPI above).  Objective:   Physical Exam  BP 148/100 mmHg  Pulse 71  Temp(Src) 98.1 F (36.7 C) (Oral)  Ht 6' 0.5" (1.842 m)  Wt 327 lb (148.326 kg)  BMI 43.72 kg/m2  SpO2 98% Wt Readings from Last 3 Encounters:  05/21/16 327 lb (148.326 kg)  02/22/15 317 lb (143.79 kg)  10/17/14 318 lb (144.244 kg)    General: Appears his stated age, obese in NAD. Skin: Warm, dry and intact. Folliculitis noted of beard. He has grouped areas of hypopigmentation of bilateral ankles medially. HEENT: Head: normal shape and size; Eyes: sclera white, no icterus, conjunctiva pink, PERRLA and EOMs intact; Ears: Tm's gray and intact, normal light reflex; Throat/Mouth: Teeth present, mucosa pink and moist, no exudate, lesions or ulcerations noted.  Neck:  Neck supple, trachea midline. No masses, lumps or thyromegaly present.  Cardiovascular: Normal rate and rhythm. S1,S2 noted.  No murmur, rubs or gallops noted. No JVD or BLE edema. No carotid bruits noted. Pulmonary/Chest: Normal effort and positive vesicular breath sounds. No respiratory distress. No wheezes, rales or ronchi noted.  Abdomen: Soft and nontender. Normal bowel sounds. No distention or masses noted. Liver, spleen and kidneys non palpable. Musculoskeletal: Strength 5/5 BUE/BLE. No signs of joint swelling. No difficulty with gait.  Neurological: Alert and oriented. Cranial nerves II-XII grossly intact. Coordination normal.  Psychiatric: Mood and affect normal. Behavior is normal. Judgment and thought content normal.     BMET    Component  Value Date/Time   NA 136 02/22/2015 1449   K 4.4 02/22/2015 1449   CL 103 02/22/2015 1449   CO2 32 02/22/2015 1449   GLUCOSE 97 02/22/2015 1449   BUN 20 02/22/2015 1449   CREATININE 1.62* 02/22/2015 1449   CALCIUM 9.5 02/22/2015 1449   GFRNONAA 34* 12/18/2012 0625   GFRAA 39* 12/18/2012 0625    Lipid Panel     Component Value Date/Time   CHOL 147 02/22/2015 1449   TRIG 82.0 02/22/2015 1449   HDL 41.20 02/22/2015 1449   CHOLHDL 4 02/22/2015 1449   VLDL 16.4 02/22/2015 1449   LDLCALC 89 02/22/2015 1449    CBC    Component Value Date/Time   WBC 3.5* 02/22/2015 1449   RBC 5.24 02/22/2015 1449   HGB 15.6 02/22/2015 1449   HCT 46.1 02/22/2015 1449   PLT 170.0 02/22/2015 1449   MCV 88.0 02/22/2015 1449   MCH 28.9 12/10/2012 0555   MCHC 33.8 02/22/2015 1449  RDW 14.6 02/22/2015 1449   LYMPHSABS 1.8 12/21/2013 1621   MONOABS 0.3 12/21/2013 1621   EOSABS 0.1 12/21/2013 1621   BASOSABS 0.0 12/21/2013 1621    Hgb A1C Lab Results  Component Value Date   HGBA1C 5.7 02/22/2015         Assessment & Plan:   Preventative Health Maintenance:  Encouraged him to get a flu shot in the fall Tetanus UTD Encouraged him to consume a balanced diet and exercise regimen with goal to lose 10% of body weight by next visit. Encouraged him to see a dentist at least annually Will check CBC, CMET, Lipid, A1C today  RTC in 6 months to follow up chronic conditions

## 2016-05-21 NOTE — Assessment & Plan Note (Signed)
Continue Benicar-refilled today Discussed the importance of medication compliance

## 2016-05-21 NOTE — Patient Instructions (Signed)

## 2016-05-21 NOTE — Assessment & Plan Note (Signed)
Had a long discussion of risks of ongoing noncompliance

## 2016-05-23 MED FILL — CARVEDILOL 12.5 MG TABLET: 12.5 | 30 days supply | Qty: 60 | Fill #1

## 2016-06-19 MED FILL — CARTIA XT 240 MG CAPSULE: 240 | 90 days supply | Qty: 90 | Fill #0

## 2016-07-24 MED FILL — CARVEDILOL 12.5 MG TABLET: 12.5 | 30 days supply | Qty: 60 | Fill #2

## 2016-08-08 DIAGNOSIS — Z0001 Encounter for general adult medical examination with abnormal findings: Secondary | ICD-10-CM | POA: Diagnosis not present

## 2016-08-08 DIAGNOSIS — R208 Other disturbances of skin sensation: Secondary | ICD-10-CM | POA: Diagnosis not present

## 2016-08-08 DIAGNOSIS — I10 Essential (primary) hypertension: Secondary | ICD-10-CM | POA: Diagnosis not present

## 2016-08-08 DIAGNOSIS — I4891 Unspecified atrial fibrillation: Secondary | ICD-10-CM | POA: Diagnosis not present

## 2016-09-05 MED FILL — OLMESARTAN-HCTZ 40-25 MG TA: 40-25 | 90 days supply | Qty: 90 | Fill #1

## 2016-09-05 MED FILL — CARVEDILOL 12.5 MG TABLET: 12.5 | 30 days supply | Qty: 60 | Fill #3

## 2016-10-03 MED FILL — CARTIA XT 240 MG CAPSULE: 240 | 90 days supply | Qty: 90 | Fill #1

## 2016-10-18 MED FILL — CARVEDILOL 12.5 MG TABLET: 12.5 | 30 days supply | Qty: 60 | Fill #4

## 2016-11-07 DIAGNOSIS — I10 Essential (primary) hypertension: Secondary | ICD-10-CM | POA: Diagnosis not present

## 2016-11-07 DIAGNOSIS — Z8673 Personal history of transient ischemic attack (TIA), and cerebral infarction without residual deficits: Secondary | ICD-10-CM | POA: Diagnosis not present

## 2016-11-07 DIAGNOSIS — M25562 Pain in left knee: Secondary | ICD-10-CM | POA: Diagnosis not present

## 2016-11-07 DIAGNOSIS — I4891 Unspecified atrial fibrillation: Secondary | ICD-10-CM | POA: Diagnosis not present

## 2016-11-18 ENCOUNTER — Ambulatory Visit: Payer: 59 | Admitting: Internal Medicine

## 2016-11-20 MED FILL — CARVEDILOL 12.5 MG TABLET: 12.5 | 30 days supply | Qty: 60 | Fill #5

## 2016-11-28 ENCOUNTER — Encounter: Payer: Self-pay | Admitting: Internal Medicine

## 2016-11-28 ENCOUNTER — Ambulatory Visit (INDEPENDENT_AMBULATORY_CARE_PROVIDER_SITE_OTHER): Payer: 59 | Admitting: Internal Medicine

## 2016-11-28 ENCOUNTER — Other Ambulatory Visit: Payer: Self-pay | Admitting: Internal Medicine

## 2016-11-28 VITALS — BP 148/92 | HR 69 | Temp 98.1°F | Wt 336.0 lb

## 2016-11-28 DIAGNOSIS — N289 Disorder of kidney and ureter, unspecified: Secondary | ICD-10-CM | POA: Diagnosis not present

## 2016-11-28 DIAGNOSIS — I10 Essential (primary) hypertension: Secondary | ICD-10-CM

## 2016-11-28 DIAGNOSIS — I69359 Hemiplegia and hemiparesis following cerebral infarction affecting unspecified side: Secondary | ICD-10-CM

## 2016-11-28 DIAGNOSIS — I4891 Unspecified atrial fibrillation: Secondary | ICD-10-CM

## 2016-11-28 DIAGNOSIS — Z6841 Body Mass Index (BMI) 40.0 and over, adult: Secondary | ICD-10-CM

## 2016-11-28 LAB — COMPREHENSIVE METABOLIC PANEL
ALK PHOS: 45 U/L (ref 39–117)
ALT: 22 U/L (ref 0–53)
AST: 27 U/L (ref 0–37)
Albumin: 4 g/dL (ref 3.5–5.2)
BILIRUBIN TOTAL: 0.5 mg/dL (ref 0.2–1.2)
BUN: 18 mg/dL (ref 6–23)
CO2: 34 meq/L — AB (ref 19–32)
Calcium: 9.4 mg/dL (ref 8.4–10.5)
Chloride: 101 mEq/L (ref 96–112)
Creatinine, Ser: 1.56 mg/dL — ABNORMAL HIGH (ref 0.40–1.50)
GFR: 65.36 mL/min (ref >60.00)
GLUCOSE: 102 mg/dL — AB (ref 70–99)
POTASSIUM: 4 meq/L (ref 3.5–5.1)
SODIUM: 141 meq/L (ref 135–145)
TOTAL PROTEIN: 7.7 g/dL (ref 6.0–8.3)

## 2016-11-28 MED ORDER — AMLODIPINE BESYLATE 10 MG PO TABS: 10.0000 mg | ORAL_TABLET | Freq: Every day | ORAL | 0 refills | Status: DC

## 2016-11-28 MED FILL — AMLODIPINE BESYLATE 10 MG T: 10 | 30 days supply | Qty: 30 | Fill #0

## 2016-11-28 NOTE — Assessment & Plan Note (Signed)
Encouraged him to work on diet and exercise 

## 2016-11-28 NOTE — Progress Notes (Signed)
Subjective:    Patient ID: Thomas Quant., male    DOB: [birth year], 35 y.o.   MRN: [id number]  HPI  Pt presents to the clinic today for 6 month follow up of chronic conditions.Marland Kitchen  HTN with Cardiomyopathy: He is taking Benicar HCT and Carvedilol as prescribed. His BP today is 148/92. ECG from 10/2014 reviewed. He follows with Dr. Virgina Jock.  Afib: Persistent. He is taking Diltiazem, Carvedilol and Xarelto. He denies recent rapid heartbeat, chest pain, shortness of breath or abnormal bleeding. He follows with Dr. Virgina Jock.  Obesity: He has gained 9 lbs over the last 6 months. His weight today is 336 lbs with a BMI of 44.94. He does not adhere to any specific diet or exercise regimen.  CKD: Secondary to uncontrolled HTN. He is on an ARB. His baseline creatinine is 1.7.  History of Stroke with right hemiparesis: His last LDL was 96, 05/2016. Echo and carotid dopplers from 12/2012 reviewed. He is taking Benicar HCT and Carvedilol as prescribed.   Review of Systems  Past Medical History:  Diagnosis Date  . Atrial fibrillation (HCC) 10/29/2010  . Benign essential hypertension 12/15/2008  . Cardiomyopathy 11/28/2010  . Hematuria, unspecified 12/15/2008  . History of chicken pox   . Overweight(278.02) 08/23/2010  . Palpitation 11/28/2010  . Paroxysmal SVT (supraventricular tachycardia) (HCC) 12/15/2008  . Renal insufficiency 11/28/2010  . 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 carvedilol (COREG) 12.5 MG tablet TAKE 1 TABLET BY MOUTH TWICE DAILY WITH MEALS 60 tablet 5  . diltiazem (CARTIA XT) 240 MG 24 hr capsule Take 1 capsule (240 mg total) by mouth daily. 90 capsule 1  . olmesartan-hydrochlorothiazide (BENICAR HCT) 40-25 MG tablet Take 1 tablet by mouth daily. 90 tablet 1  . XARELTO 20 MG TABS tablet  Take 1 tablet (20 mg total) by mouth daily with supper. 90 tablet 1   No current facility-administered medications for this visit.     No Known Allergies  Family History  Problem Relation Age of Onset  . Hypertension Mother   . Diabetes Mother   . Hypertension Father   . Glaucoma Father   . Hyperlipidemia Other     Parent    Social History   Social History  . Marital status: Single    Spouse name: N/A  . Number of children: N/A  . Years of education: 44   Occupational History  . Behavioral Health   .  [employer]   Social History Main Topics  . Smoking status: Never Smoker  . Smokeless tobacco: Never Used  . Alcohol use 0.6 oz/week    1 Shots of liquor per week     Comment: rare  . Drug use: No  . Sexual activity: Yes    Birth control/ protection: Condom   Other Topics Concern  . Not on file   Social History Narrative   Neither of his parents nor any siblings have any cardiac or heart rhythm issues. He has 2 grandparents that were on blood thinners but doesn't know why.      Regular exercise-no   Caffeine Use-yes     Constitutional: Pt reports weight gain. Denies fever, malaise, fatigue, headache.  Respiratory: Denies difficulty breathing, shortness of breath, cough or sputum production.   Cardiovascular: Denies chest pain, chest tightness, palpitations or  swelling in the hands or feet.  Neurological: Denies dizziness, difficulty with memory, difficulty with speech or problems with balance and coordination.    No other specific complaints in a complete review of systems (except as listed in HPI above).     Objective:   Physical Exam  BP (!) 148/92   Pulse 69   Temp 98.1 F (36.7 C) (Oral)   Wt (!) 336 lb (152.4 kg)   SpO2 97%   BMI 44.94 kg/m  Wt Readings from Last 3 Encounters:  11/28/16 (!) 336 lb (152.4 kg)  05/21/16 (!) 327 lb (148.3 kg)  02/22/15 (!) 317 lb (143.8 kg)    General: Appears his stated age, obese in  NAD. Cardiovascular: Normal rate and rhythm. S1,S2 noted.  Occasional irregular beat noted. Pulmonary/Chest: Normal effort and positive vesicular breath sounds. No respiratory distress. No wheezes, rales or ronchi noted.  Neurological: Alert and oriented.   BMET    Component Value Date/Time   NA 138 05/21/2016 0850   K 4.4 05/21/2016 0850   CL 103 05/21/2016 0850   CO2 30 05/21/2016 0850   GLUCOSE 98 05/21/2016 0850   BUN 26 (H) 05/21/2016 0850   CREATININE 1.76 (H) 05/21/2016 0850   CALCIUM 10.1 05/21/2016 0850   GFRNONAA 34 (L) 12/18/2012 0625   GFRAA 39 (L) 12/18/2012 0625    Lipid Panel     Component Value Date/Time   CHOL 154 05/21/2016 0850   TRIG 96.0 05/21/2016 0850   HDL 39.10 05/21/2016 0850   CHOLHDL 4 05/21/2016 0850   VLDL 19.2 05/21/2016 0850   LDLCALC 96 05/21/2016 0850    CBC    Component Value Date/Time   WBC 4.8 05/21/2016 0850   RBC 5.26 05/21/2016 0850   HGB 15.3 05/21/2016 0850   HCT 46.2 05/21/2016 0850   PLT 165.0 05/21/2016 0850   MCV 87.8 05/21/2016 0850   MCH 28.9 12/10/2012 0555   MCHC 33.2 05/21/2016 0850   RDW 14.5 05/21/2016 0850   LYMPHSABS 1.8 12/21/2013 1621   MONOABS 0.3 12/21/2013 1621   EOSABS 0.1 12/21/2013 1621   BASOSABS 0.0 12/21/2013 1621    Hgb A1C Lab Results  Component Value Date   HGBA1C 5.8 05/21/2016            Assessment & Plan:

## 2016-11-28 NOTE — Assessment & Plan Note (Signed)
Improved  Will monitor

## 2016-11-28 NOTE — Assessment & Plan Note (Signed)
Still uncontrolled Add Amlodipine Continue Benicar HCT and Carvedilol CMET today  RTC in 3 weeks to follow up HTN

## 2016-11-28 NOTE — Assessment & Plan Note (Signed)
Continue Diltiazem, Carvedilol and Xarelto He will call to make a follow up appt with cardiology

## 2016-11-28 NOTE — Patient Instructions (Signed)
Hypertension Hypertension is another name for high blood pressure. High blood pressure forces your heart to work harder to pump blood. A blood pressure reading has two numbers, which includes a higher number over a lower number (example: 110/72). Follow these instructions at home:  Have your blood pressure rechecked by your doctor.  Only take medicine as told by your doctor. Follow the directions carefully. The medicine does not work as well if you skip doses. Skipping doses also puts you at risk for problems.  Do not smoke.  Monitor your blood pressure at home as told by your doctor. Contact a doctor if:  You think you are having a reaction to the medicine you are taking.  You have repeat headaches or feel dizzy.  You have puffiness (swelling) in your ankles.  You have trouble with your vision. Get help right away if:  You get a very bad headache and are confused.  You feel weak, numb, or faint.  You get chest or belly (abdominal) pain.  You throw up (vomit).  You cannot breathe very well. This information is not intended to replace advice given to you by your health care provider. Make sure you discuss any questions you have with your health care provider. Document Released: 05/06/2008 Document Revised: 04/25/2016 Document Reviewed: 09/10/2013 Elsevier Interactive Patient Education  2017 [employer]  

## 2016-11-28 NOTE — Assessment & Plan Note (Signed)
CMET today He will continue his ARB

## 2016-12-06 ENCOUNTER — Other Ambulatory Visit: Payer: Self-pay | Admitting: Internal Medicine

## 2016-12-06 DIAGNOSIS — I10 Essential (primary) hypertension: Secondary | ICD-10-CM

## 2016-12-06 MED FILL — XARELTO 20 MG TABLET: 20 | 90 days supply | Qty: 90 | Fill #1

## 2016-12-09 MED FILL — OLMESARTAN-HCTZ 40-25 MG TA: 40-25 | 90 days supply | Qty: 90 | Fill #0

## 2016-12-19 ENCOUNTER — Ambulatory Visit: Payer: 59 | Admitting: Internal Medicine

## 2017-01-03 ENCOUNTER — Other Ambulatory Visit: Payer: Self-pay | Admitting: Internal Medicine

## 2017-01-03 MED FILL — CARVEDILOL 12.5 MG TABLET: 12.5 | 30 days supply | Qty: 60 | Fill #0

## 2017-01-08 ENCOUNTER — Other Ambulatory Visit: Payer: Self-pay | Admitting: Internal Medicine

## 2017-01-08 DIAGNOSIS — I482 Chronic atrial fibrillation, unspecified: Secondary | ICD-10-CM

## 2017-01-08 MED FILL — CARTIA XT 240 MG CAPSULE: 240 | 90 days supply | Qty: 90 | Fill #0

## 2017-01-14 ENCOUNTER — Ambulatory Visit: Payer: Self-pay | Admitting: Internal Medicine

## 2017-01-14 NOTE — Progress Notes (Deleted)
Subjective:    Patient ID: Thomas Quant., male    DOB: [birth year], 36 y.o.   MRN: [id number]  HPI  Pt presents to the clinic today for 3 week follow up of HTN. His BP at his last visit was 148/92. Amlodipine 10 mg was added to his regimen of Benicar HCT 40-25 mg and Carvedilol 12.5 mg. He has been taking the medication as prescribed and denies adverse side effects. His BP today is. ECG from 10/2014 reviewed.  Review of Systems      Past Medical History:  Diagnosis Date  . Atrial fibrillation (HCC) 10/29/2010  . Benign essential hypertension 12/15/2008  . Cardiomyopathy 11/28/2010  . Hematuria, unspecified 12/15/2008  . History of chicken pox   . Overweight(278.02) 08/23/2010  . Palpitation 11/28/2010  . Paroxysmal SVT (supraventricular tachycardia) (HCC) 12/15/2008  . Renal insufficiency 11/28/2010  . 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 amLODipine (NORVASC) 10 MG tablet Take 1 tablet (10 mg total) by mouth daily. 30 tablet 0  . CARTIA XT 240 MG 24 hr capsule TAKE 1 CAPSULE BY MOUTH ONCE DAILY 90 capsule 0  . carvedilol (COREG) 12.5 MG tablet TAKE 1 TABLET BY MOUTH TWICE DAILY WITH MEALS 60 tablet 5  . olmesartan-hydrochlorothiazide (BENICAR HCT) 40-25 MG tablet TAKE 1 TABLET BY MOUTH ONCE DAILY 90 tablet 0  . XARELTO 20 MG TABS tablet Take 1 tablet (20 mg total) by mouth daily with supper. 90 tablet 1   No current facility-administered medications for this visit.     No Known Allergies  Family History  Problem Relation Age of Onset  . Hypertension Mother   . Diabetes Mother   . Hypertension Father   . Glaucoma Father   . Hyperlipidemia Other     Parent    Social History   Social History  . Marital status: Single    Spouse name: N/A  . Number of children: N/A  . Years  of education: 86   Occupational History  . Behavioral Health   .  [employer]   Social History Main Topics  . Smoking status: Never Smoker  . Smokeless tobacco: Never Used  . Alcohol use 0.6 oz/week    1 Shots of liquor per week     Comment: rare  . Drug use: No  . Sexual activity: Yes    Birth control/ protection: Condom   Other Topics Concern  . Not on file   Social History Narrative   Neither of his parents nor any siblings have any cardiac or heart rhythm issues. He has 2 grandparents that were on blood thinners but doesn't know why.      Regular exercise-no   Caffeine Use-yes     Constitutional: Denies fever, malaise, fatigue, headache or abrupt weight changes.  HEENT: Denies eye pain, eye redness, ear pain, ringing in the ears, wax buildup, runny nose, nasal congestion, bloody nose, or sore throat. Respiratory: Denies difficulty breathing, shortness of breath, cough or sputum production.   Cardiovascular: Denies chest pain, chest tightness, palpitations or swelling in the hands or feet.  Gastrointestinal: Denies abdominal pain, bloating, constipation, diarrhea or blood in the stool.  GU: Denies urgency, frequency, pain with urination, burning sensation, blood in urine, odor or discharge. Musculoskeletal: Denies decrease in range of motion, difficulty with gait, muscle  pain or joint pain and swelling.  Skin: Denies redness, rashes, lesions or ulcercations.  Neurological: Denies dizziness, difficulty with memory, difficulty with speech or problems with balance and coordination.  Psych: Denies anxiety, depression, SI/HI.  No other specific complaints in a complete review of systems (except as listed in HPI above).  Objective:   Physical Exam        Assessment & Plan:

## 2017-01-23 ENCOUNTER — Ambulatory Visit: Payer: 59 | Admitting: Internal Medicine

## 2017-02-04 ENCOUNTER — Encounter: Payer: Self-pay | Admitting: Internal Medicine

## 2017-02-04 ENCOUNTER — Ambulatory Visit (INDEPENDENT_AMBULATORY_CARE_PROVIDER_SITE_OTHER): Payer: 59 | Admitting: Internal Medicine

## 2017-02-04 DIAGNOSIS — I10 Essential (primary) hypertension: Secondary | ICD-10-CM

## 2017-02-04 NOTE — Assessment & Plan Note (Signed)
He refuses to take any additional meds at this time Will monitor BP for now  Advised him to make a follow up appt with Dr. Antoine Poche

## 2017-02-04 NOTE — Patient Instructions (Signed)
Hypertension °Hypertension is another name for high blood pressure. High blood pressure forces your heart to work harder to pump blood. This can cause problems over time. °There are two numbers in a blood pressure reading. There is a top number (systolic) over a bottom number (diastolic). It is best to have a blood pressure below 120/80. Healthy choices can help lower your blood pressure. You may need medicine to help lower your blood pressure if: °· Your blood pressure cannot be lowered with healthy choices. °· Your blood pressure is higher than 130/80. °Follow these instructions at home: °Eating and drinking  °· If directed, follow the DASH eating plan. This diet includes: °¨ Filling half of your plate at each meal with fruits and vegetables. °¨ Filling one quarter of your plate at each meal with whole grains. Whole grains include whole wheat pasta, brown rice, and whole grain bread. °¨ Eating or drinking low-fat dairy products, such as skim milk or low-fat yogurt. °¨ Filling one quarter of your plate at each meal with low-fat (lean) proteins. Low-fat proteins include fish, skinless chicken, eggs, beans, and tofu. °¨ Avoiding fatty meat, cured and processed meat, or chicken with skin. °¨ Avoiding premade or processed food. °· Eat less than 1,500 mg of salt (sodium) a day. °· Limit alcohol use to no more than 1 drink a day for nonpregnant women and 2 drinks a day for men. One drink equals 12 oz of beer, 5 oz of wine, or 1½ oz of hard liquor. °Lifestyle  °· Work with your doctor to stay at a healthy weight or to lose weight. Ask your doctor what the best weight is for you. °· Get at least 30 minutes of exercise that causes your heart to beat faster (aerobic exercise) most days of the week. This may include walking, swimming, or biking. °· Get at least 30 minutes of exercise that strengthens your muscles (resistance exercise) at least 3 days a week. This may include lifting weights or pilates. °· Do not use any  products that contain nicotine or tobacco. This includes cigarettes and e-cigarettes. If you need help quitting, ask your doctor. °· Check your blood pressure at home as told by your doctor. °· Keep all follow-up visits as told by your doctor. This is important. °Medicines  °· Take over-the-counter and prescription medicines only as told by your doctor. Follow directions carefully. °· Do not skip doses of blood pressure medicine. The medicine does not work as well if you skip doses. Skipping doses also puts you at risk for problems. °· Ask your doctor about side effects or reactions to medicines that you should watch for. °Contact a doctor if: °· You think you are having a reaction to the medicine you are taking. °· You have headaches that keep coming back (recurring). °· You feel dizzy. °· You have swelling in your ankles. °· You have trouble with your vision. °Get help right away if: °· You get a very bad headache. °· You start to feel confused. °· You feel weak or numb. °· You feel faint. °· You get very bad pain in your: °¨ Chest. °¨ Belly (abdomen). °· You throw up (vomit) more than once. °· You have trouble breathing. °Summary °· Hypertension is another name for high blood pressure. °· Making healthy choices can help lower blood pressure. If your blood pressure cannot be controlled with healthy choices, you may need to take medicine. °This information is not intended to replace advice given to you by your   health care provider. Make sure you discuss any questions you have with your health care provider. °Document Released: 05/06/2008 Document Revised: 10/16/2016 Document Reviewed: 10/16/2016 °Elsevier Interactive Patient Education © 2017 [employer] ° °

## 2017-02-04 NOTE — Progress Notes (Signed)
Subjective:    Patient ID: Thomas Quant., male    DOB: May 17, 1981, 36 y.o.   MRN: [id number]  HPI  Pt presents to the clinic today for follow up of HTN. His BP at his last visit was 148/92. Amlodipine 10 mg daily was added to his Olmesartan-HCT and Carvedilol. He is also on Cardizem for Afib. He reports he took the medication the first month, but never requested a refill because he felt like he did not need it. His BP today is 140/82. ECG from 10/2014. He has not seen his cardiologist, Dr. Antoine Poche in > 2 years.  Review of Systems      Past Medical History:  Diagnosis Date  . Atrial fibrillation (HCC) 10/29/2010  . Benign essential hypertension 12/15/2008  . Cardiomyopathy 11/28/2010  . Hematuria, unspecified 12/15/2008  . History of chicken pox   . Overweight(278.02) 08/23/2010  . Palpitation 11/28/2010  . Paroxysmal SVT (supraventricular tachycardia) (HCC) 12/15/2008  . Renal insufficiency 11/28/2010  . 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 amLODipine (NORVASC) 10 MG tablet Take 1 tablet (10 mg total) by mouth daily. 30 tablet 0  . CARTIA XT 240 MG 24 hr capsule TAKE 1 CAPSULE BY MOUTH ONCE DAILY 90 capsule 0  . carvedilol (COREG) 12.5 MG tablet TAKE 1 TABLET BY MOUTH TWICE DAILY WITH MEALS 60 tablet 5  . olmesartan-hydrochlorothiazide (BENICAR HCT) 40-25 MG tablet TAKE 1 TABLET BY MOUTH ONCE DAILY 90 tablet 0  . XARELTO 20 MG TABS tablet Take 1 tablet (20 mg total) by mouth daily with supper. 90 tablet 1   No current facility-administered medications for this visit.     No Known Allergies  Family History  Problem Relation Age of Onset  . Hypertension Mother   . Diabetes Mother   . Hypertension Father   . Glaucoma Father   . Hyperlipidemia Other     Parent    Social History     Social History  . Marital status: Single    Spouse name: N/A  . Number of children: N/A  . 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 Smoking status: Never Smoker  . Smokeless tobacco: Never Used  . Alcohol use 0.6 oz/week    1 Shots of liquor per week     Comment: rare  . Drug use: No  . Sexual activity: Yes    Birth control/ protection: Condom   Other Topics Concern  . Not on file   Social History Narrative   Neither of his parents nor any siblings have any cardiac or heart rhythm issues. He has 2 grandparents that were on blood thinners but doesn't know why.      Regular exercise-no   Caffeine Use-yes     Constitutional: Denies fever, malaise, fatigue, headache or abrupt weight changes.  Respiratory: Denies difficulty breathing, shortness of breath, cough or sputum production.   Cardiovascular: Denies chest pain, chest tightness, palpitations or swelling in the hands or feet.  Neurological: Denies dizziness, difficulty with memory, difficulty with speech or problems with balance and coordination.    No other specific complaints in a complete review of systems (except as listed in HPI above).  Objective:   Physical Exam  BP 140/82   Pulse 83   Temp 98.1 F (36.7 C) (Oral)   Wt (!) 337 lb (152.9 kg)   SpO2 98%   BMI 45.08 kg/m  Wt Readings from Last 3 Encounters:  02/04/17 (!) 337 lb (152.9 kg)  11/28/16 (!) 336 lb (152.4 kg)  05/21/16 (!) 327 lb (148.3 kg)    General: Appears his stated age, obese in NAD. Cardiovascular: Normal rate and rhythm. S1,S2 noted.  No murmur, rubs or gallops noted.  Pulmonary/Chest: Normal effort and positive vesicular breath sounds. No respiratory distress. No wheezes, rales or ronchi noted.  Neurological: Alert and oriented.   BMET    Component Value Date/Time   NA 141 11/28/2016 1142   K 4.0 11/28/2016 1142   CL 101 11/28/2016 1142    CO2 34 (H) 11/28/2016 1142   GLUCOSE 102 (H) 11/28/2016 1142   BUN 18 11/28/2016 1142   CREATININE 1.56 (H) 11/28/2016 1142   CALCIUM 9.4 11/28/2016 1142   GFRNONAA 34 (L) 12/18/2012 0625   GFRAA 39 (L) 12/18/2012 0625    Lipid Panel     Component Value Date/Time   CHOL 154 05/21/2016 0850   TRIG 96.0 05/21/2016 0850   HDL 39.10 05/21/2016 0850   CHOLHDL 4 05/21/2016 0850   VLDL 19.2 05/21/2016 0850   LDLCALC 96 05/21/2016 0850    CBC    Component Value Date/Time   WBC 4.8 05/21/2016 0850   RBC 5.26 05/21/2016 0850   HGB 15.3 05/21/2016 0850   HCT 46.2 05/21/2016 0850   PLT 165.0 05/21/2016 0850   MCV 87.8 05/21/2016 0850   MCH 28.9 12/10/2012 0555   MCHC 33.2 05/21/2016 0850   RDW 14.5 05/21/2016 0850   LYMPHSABS 1.8 12/21/2013 1621   MONOABS 0.3 12/21/2013 1621   EOSABS 0.1 12/21/2013 1621   BASOSABS 0.0 12/21/2013 1621    Hgb A1C Lab Results  Component Value Date   HGBA1C 5.8 05/21/2016            Assessment & Plan:

## 2017-02-12 NOTE — Progress Notes (Signed)
Cardiology Office Note   Date:  02/13/2017   ID:  Thomas Quant., DOB 12/12/1980, MRN [id number]  PCP:  Thomas Reaper, NP  Cardiologist:   Thomas Rotunda, MD  Referring:  Thomas Reaper, NP  Chief Complaint  Patient presents with  . Atrial Fibrillation      History of Present Illness: Thomas [name]. is a 36 y.o. male who presents for follow up of atrial fib, HTN and a CVA. He has been treated with TPA in past for his CV. He has had a reasonable recovery but still has some left-sided arm and hand numbness.  He did have some evidence of LVH on echocardiogram.  I have not seen him since 2015.   He has done well since I last saw him. He does do some exercising and he works full-time [employer].  The patient denies any new symptoms such as chest discomfort, neck or arm discomfort. There has been no new shortness of breath, PND or orthopnea. There have been no reported palpitations, presyncope or syncope.   Past Medical History:  Diagnosis Date  . Atrial fibrillation (HCC) 10/29/2010  . Benign essential hypertension 12/15/2008  . Cardiomyopathy 11/28/2010  . Hematuria, unspecified 12/15/2008  . History of chicken pox   . Overweight(278.02) 08/23/2010  . Palpitation 11/28/2010  . Paroxysmal SVT (supraventricular tachycardia) (HCC) 12/15/2008  . Renal insufficiency 11/28/2010  . 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   Current Outpatient Prescriptions  Medication Sig Dispense Refill  . CARTIA XT 240 MG 24 hr capsule TAKE 1 CAPSULE BY MOUTH ONCE DAILY 90 capsule 0  . carvedilol (COREG) 12.5 MG tablet TAKE 1 TABLET BY MOUTH TWICE DAILY WITH MEALS 60 tablet 5  . olmesartan-hydrochlorothiazide (BENICAR HCT) 40-25 MG tablet TAKE 1 TABLET BY MOUTH ONCE DAILY 90 tablet 0  . XARELTO 20 MG TABS tablet Take 1 tablet (20 mg  total) by mouth daily with supper. 90 tablet 1   No current facility-administered medications for this visit.     Allergies:   Patient has no known allergies.    ROS:  Please see the history of present illness.   Otherwise, review of systems are positive for none.   All other systems are reviewed and negative.    PHYSICAL EXAM: VS:  BP (!) 146/97   Pulse 87   Ht 6\' 1"  (1.854 m)   Wt (!) 338 lb 12.8 oz (153.7 kg)   BMI 44.70 kg/m  , BMI Body mass index is 44.7 kg/m. GENERAL:  Well appearing NECK:  No jugular venous distention, waveform within normal limits, carotid upstroke brisk and symmetric, no bruits, no thyromegaly LUNGS:  Clear to auscultation bilaterally BACK:  No CVA tenderness CHEST:  Unremarkable HEART:  PMI not displaced or sustained,S1 and S2 within normal limits, no S3, no clicks, no rubs, no murmurs, irregular ABD:  Flat, positive bowel sounds normal in frequency in pitch, no bruits, no rebound, no guarding, no midline pulsatile mass, no hepatomegaly, no splenomegaly EXT:  2 plus pulses throughout, no edema, no cyanosis no clubbing  EKG:  EKG is ordered today. The ekg ordered today demonstrates atrial fib with rate 87, axis WNL, intervals WNL, non specific ST T wave changes.   No change from previous.    Recent Labs: 05/21/2016: Hemoglobin 15.3; Platelets 165.0 11/28/2016: ALT 22;  BUN 18; Creatinine, Ser 1.56; Potassium 4.0; Sodium 141    Lipid Panel    Component Value Date/Time   CHOL 154 05/21/2016 0850   TRIG 96.0 05/21/2016 0850   HDL 39.10 05/21/2016 0850   CHOLHDL 4 05/21/2016 0850   VLDL 19.2 05/21/2016 0850   LDLCALC 96 05/21/2016 0850      Wt Readings from Last 3 Encounters:  02/13/17 (!) 338 lb 12.8 oz (153.7 kg)  02/04/17 (!) 337 lb (152.9 kg)  11/28/16 (!) 336 lb (152.4 kg)      Other studies Reviewed: Additional studies/ records that were reviewed today include: Labs. Review of the above records demonstrates:  Please see elsewhere in  the note.     ASSESSMENT AND PLAN:  ATRIAL FIB:  [name] has a [name]2DS2 - VASc score of 3.  The patient  tolerates this rhythm and rate control and anticoagulation. We will continue with the meds as listed.  CVA:  He has very mild residual from this.  No change in therapy is planned.    NON ISCHEMIC CARDIOMYOPATHY:  EF was 50 - 55%.  He did have LVH in the past and I will evaluate with a repeat echo.    HTN:   We had a long discussion about this. At this point I'm going to manage him medically with the meds as listed and have counseled him with detailed information on weight loss which will probably get to a target BP.    OBESITY:  The patient understands the need to lose weight with diet and exercise. We have discussed specific strategies for this.  Current medicines are reviewed at length with the patient today.  The patient does not have concerns regarding medicines.  The following changes have been made:  no change  Labs/ tests ordered today include:   Orders Placed This Encounter  Procedures  . Basic Metabolic Panel (BMET)  . CBC  . EKG 12-Lead  . ECHOCARDIOGRAM COMPLETE     Disposition:   FU with me in one year.     Signed, Thomas Rotunda, MD  02/13/2017 8:14 PM    [facility]

## 2017-02-13 ENCOUNTER — Ambulatory Visit (INDEPENDENT_AMBULATORY_CARE_PROVIDER_SITE_OTHER): Payer: 59 | Admitting: Cardiology

## 2017-02-13 ENCOUNTER — Encounter: Payer: Self-pay | Admitting: Cardiology

## 2017-02-13 VITALS — BP 146/97 | HR 87 | Ht 73.0 in | Wt 338.8 lb

## 2017-02-13 DIAGNOSIS — I482 Chronic atrial fibrillation: Secondary | ICD-10-CM | POA: Diagnosis not present

## 2017-02-13 DIAGNOSIS — Z79899 Other long term (current) drug therapy: Secondary | ICD-10-CM

## 2017-02-13 DIAGNOSIS — I429 Cardiomyopathy, unspecified: Secondary | ICD-10-CM

## 2017-02-13 DIAGNOSIS — I4821 Permanent atrial fibrillation: Secondary | ICD-10-CM

## 2017-02-13 LAB — CBC
HEMATOCRIT: 46.3 % (ref 38.5–50.0)
Hemoglobin: 15.4 g/dL (ref 13.2–17.1)
MCH: 30 pg (ref 27.0–33.0)
MCHC: 33.3 g/dL (ref 32.0–36.0)
MCV: 90.1 fL (ref 80.0–100.0)
MPV: 11 fL (ref 7.5–12.5)
Platelets: 168 10*3/uL (ref 140–400)
RBC: 5.14 MIL/uL (ref 4.20–5.80)
RDW: 14.1 % (ref 11.0–15.0)
WBC: 3.9 10*3/uL (ref 3.8–10.8)

## 2017-02-13 LAB — BASIC METABOLIC PANEL
BUN: 22 mg/dL (ref 7–25)
CHLORIDE: 105 mmol/L (ref 98–110)
CO2: 30 mmol/L (ref 20–31)
CREATININE: 1.69 mg/dL — AB (ref 0.60–1.35)
Calcium: 9.5 mg/dL (ref 8.6–10.3)
Glucose, Bld: 77 mg/dL (ref 65–99)
POTASSIUM: 4.3 mmol/L (ref 3.5–5.3)
Sodium: 142 mmol/L (ref 135–146)

## 2017-02-13 NOTE — Patient Instructions (Signed)
Medication Instructions:  Continue current medications  Labwork: CBC and BMP Today  Testing/Procedures: Your physician has requested that you have an echocardiogram. Echocardiography is a painless test that uses sound waves to create images of your heart. It provides your doctor with information about the size and shape of your heart and how well your heart's chambers and valves are working. This procedure takes approximately one hour. There are no restrictions for this procedure.  Follow-Up: Your physician wants you to follow-up in: 1 Year. You will receive a reminder letter in the mail two months in advance. If you don't receive a letter, please call our office to schedule the follow-up appointment.   Any Other Special Instructions Will Be Listed Below (If Applicable).   If you need a refill on your cardiac medications before your next appointment, please call your pharmacy.

## 2017-02-18 MED FILL — CARVEDILOL 12.5 MG TABLET: 12.5 | 30 days supply | Qty: 60 | Fill #1

## 2017-02-28 ENCOUNTER — Ambulatory Visit (HOSPITAL_COMMUNITY): Payer: 59 | Attending: Cardiology

## 2017-02-28 ENCOUNTER — Other Ambulatory Visit: Payer: Self-pay

## 2017-02-28 DIAGNOSIS — R002 Palpitations: Secondary | ICD-10-CM | POA: Diagnosis not present

## 2017-02-28 DIAGNOSIS — I10 Essential (primary) hypertension: Secondary | ICD-10-CM | POA: Insufficient documentation

## 2017-02-28 DIAGNOSIS — I429 Cardiomyopathy, unspecified: Secondary | ICD-10-CM | POA: Insufficient documentation

## 2017-02-28 DIAGNOSIS — Z8249 Family history of ischemic heart disease and other diseases of the circulatory system: Secondary | ICD-10-CM | POA: Insufficient documentation

## 2017-02-28 DIAGNOSIS — I4891 Unspecified atrial fibrillation: Secondary | ICD-10-CM | POA: Diagnosis not present

## 2017-02-28 DIAGNOSIS — I471 Supraventricular tachycardia: Secondary | ICD-10-CM | POA: Insufficient documentation

## 2017-02-28 DIAGNOSIS — I340 Nonrheumatic mitral (valve) insufficiency: Secondary | ICD-10-CM | POA: Insufficient documentation

## 2017-03-03 ENCOUNTER — Encounter: Payer: Self-pay | Admitting: *Deleted

## 2017-03-13 ENCOUNTER — Other Ambulatory Visit: Payer: Self-pay | Admitting: Internal Medicine

## 2017-03-13 DIAGNOSIS — I10 Essential (primary) hypertension: Secondary | ICD-10-CM

## 2017-03-13 MED FILL — OLMESARTAN-HCTZ 40-25 MG TA: 40-25 | 90 days supply | Qty: 90 | Fill #0

## 2017-03-26 DIAGNOSIS — H52223 Regular astigmatism, bilateral: Secondary | ICD-10-CM | POA: Diagnosis not present

## 2017-03-28 MED FILL — CARVEDILOL 12.5 MG TABLET: 12.5 | 30 days supply | Qty: 60 | Fill #2

## 2017-04-14 ENCOUNTER — Other Ambulatory Visit: Payer: Self-pay | Admitting: Internal Medicine

## 2017-04-14 DIAGNOSIS — I482 Chronic atrial fibrillation, unspecified: Secondary | ICD-10-CM

## 2017-04-14 NOTE — Telephone Encounter (Signed)
Pt saw Card--- advised to continue current meds... Please advise if okay to refill

## 2017-04-15 MED FILL — CARTIA XT 240 MG CAPSULE: 240 | 90 days supply | Qty: 90 | Fill #0

## 2017-05-16 ENCOUNTER — Other Ambulatory Visit: Payer: Self-pay | Admitting: Internal Medicine

## 2017-05-16 DIAGNOSIS — I482 Chronic atrial fibrillation, unspecified: Secondary | ICD-10-CM

## 2017-05-16 MED FILL — CARVEDILOL 12.5 MG TABLET: 12.5 | 30 days supply | Qty: 60 | Fill #3

## 2017-05-16 MED FILL — XARELTO 20 MG TABLET: 20 | 90 days supply | Qty: 90 | Fill #0

## 2017-05-16 NOTE — Telephone Encounter (Signed)
Pt reports he has been taking meds, although not daily... Pt expressed understanding to take daily and he states he will do better... Please advise Also CPE reminder letter mailed to pt

## 2017-05-16 NOTE — Telephone Encounter (Signed)
Call pt and see if he is taking it. He was only given a 6 months supply 1 year ago.

## 2017-05-16 NOTE — Telephone Encounter (Signed)
RX sent to pharmacy  

## 2017-05-16 NOTE — Telephone Encounter (Signed)
Last filled by you 05/2016 with 1 refill--- please advise if okay to refill

## 2017-06-20 DIAGNOSIS — M79671 Pain in right foot: Secondary | ICD-10-CM | POA: Diagnosis not present

## 2017-06-20 DIAGNOSIS — M2142 Flat foot [pes planus] (acquired), left foot: Secondary | ICD-10-CM | POA: Diagnosis not present

## 2017-07-10 ENCOUNTER — Other Ambulatory Visit: Payer: Self-pay | Admitting: Internal Medicine

## 2017-07-10 DIAGNOSIS — I482 Chronic atrial fibrillation, unspecified: Secondary | ICD-10-CM

## 2017-07-10 DIAGNOSIS — I10 Essential (primary) hypertension: Secondary | ICD-10-CM

## 2017-07-10 MED FILL — OLMESARTAN-HCTZ 40-25 MG TA: 40-25 | 90 days supply | Qty: 90 | Fill #0

## 2017-07-10 MED FILL — CARVEDILOL 12.5 MG TABS: 12.5 | 30 days supply | Qty: 60 | Fill #4

## 2017-07-10 MED FILL — CARTIA XT 240 MG CAPSULE: 240 | 90 days supply | Qty: 90 | Fill #0

## 2017-09-09 MED FILL — CARVEDILOL 12.5 MG TABS: 12.5 | 30 days supply | Qty: 60 | Fill #5

## 2017-09-29 ENCOUNTER — Telehealth: Payer: Self-pay | Admitting: Podiatry

## 2017-09-29 NOTE — Telephone Encounter (Signed)
Left vm for pt to call office to schedule appt ( for new orthotics)

## 2017-10-01 ENCOUNTER — Ambulatory Visit (INDEPENDENT_AMBULATORY_CARE_PROVIDER_SITE_OTHER): Payer: 59 | Admitting: Orthotics

## 2017-10-01 DIAGNOSIS — M205X1 Other deformities of toe(s) (acquired), right foot: Secondary | ICD-10-CM | POA: Diagnosis not present

## 2017-10-01 DIAGNOSIS — M21611 Bunion of right foot: Secondary | ICD-10-CM

## 2017-10-01 DIAGNOSIS — M7750 Other enthesopathy of unspecified foot: Secondary | ICD-10-CM | POA: Diagnosis not present

## 2017-10-01 DIAGNOSIS — M779 Enthesopathy, unspecified: Secondary | ICD-10-CM

## 2017-10-01 NOTE — Progress Notes (Signed)
Patient came into today to be cast for Custom Foot Orthotics. Upon recommendation of Dr. Zadie Cleverly  Patient presents with HAV, Hallux Limitus Goals are decrease pain in first MPJ, enhance windlass effect by reverse mortons extenstion, plantar flex first ray Plan vendor [employer]

## 2017-10-07 ENCOUNTER — Ambulatory Visit (INDEPENDENT_AMBULATORY_CARE_PROVIDER_SITE_OTHER): Payer: 59 | Admitting: Internal Medicine

## 2017-10-07 ENCOUNTER — Encounter: Payer: Self-pay | Admitting: Internal Medicine

## 2017-10-07 VITALS — BP 144/98 | HR 67 | Temp 98.4°F | Ht 72.0 in | Wt 325.0 lb

## 2017-10-07 DIAGNOSIS — Z0001 Encounter for general adult medical examination with abnormal findings: Secondary | ICD-10-CM

## 2017-10-07 DIAGNOSIS — I4891 Unspecified atrial fibrillation: Secondary | ICD-10-CM | POA: Diagnosis not present

## 2017-10-07 DIAGNOSIS — I10 Essential (primary) hypertension: Secondary | ICD-10-CM

## 2017-10-07 DIAGNOSIS — N289 Disorder of kidney and ureter, unspecified: Secondary | ICD-10-CM | POA: Diagnosis not present

## 2017-10-07 DIAGNOSIS — I69359 Hemiplegia and hemiparesis following cerebral infarction affecting unspecified side: Secondary | ICD-10-CM | POA: Diagnosis not present

## 2017-10-07 NOTE — Patient Instructions (Signed)

## 2017-10-08 LAB — LIPID PANEL
CHOL/HDL RATIO: 3
Cholesterol: 142 mg/dL (ref 0–200)
HDL: 43.4 mg/dL (ref >39.00)
LDL Cholesterol: 75 mg/dL (ref 0–99)
NONHDL: 98.94
Triglycerides: 118 mg/dL (ref 0.0–149.0)
VLDL: 23.6 mg/dL (ref 0.0–40.0)

## 2017-10-08 LAB — COMPREHENSIVE METABOLIC PANEL
ALK PHOS: 37 U/L — AB (ref 39–117)
ALT: 17 U/L (ref 0–53)
AST: 26 U/L (ref 0–37)
Albumin: 4.2 g/dL (ref 3.5–5.2)
BILIRUBIN TOTAL: 0.5 mg/dL (ref 0.2–1.2)
BUN: 29 mg/dL — AB (ref 6–23)
CO2: 29 meq/L (ref 19–32)
CREATININE: 1.67 mg/dL — AB (ref 0.40–1.50)
Calcium: 9.6 mg/dL (ref 8.4–10.5)
Chloride: 106 mEq/L (ref 96–112)
GFR: 60.12 mL/min (ref >60.00)
GLUCOSE: 70 mg/dL (ref 70–99)
Potassium: 4 mEq/L (ref 3.5–5.1)
SODIUM: 142 meq/L (ref 135–145)
TOTAL PROTEIN: 7.8 g/dL (ref 6.0–8.3)

## 2017-10-08 LAB — CBC
HCT: 46.6 % (ref 39.0–52.0)
HEMOGLOBIN: 15.1 g/dL (ref 13.0–17.0)
MCHC: 32.5 g/dL (ref 30.0–36.0)
MCV: 91 fl (ref 78.0–100.0)
Platelets: 147 10*3/uL — ABNORMAL LOW (ref 150.0–400.0)
RBC: 5.12 Mil/uL (ref 4.22–5.81)
RDW: 14.5 % (ref 11.5–15.5)
WBC: 4.3 10*3/uL (ref 4.0–10.5)

## 2017-10-13 ENCOUNTER — Encounter: Payer: Self-pay | Admitting: Internal Medicine

## 2017-10-13 NOTE — Assessment & Plan Note (Addendum)
Uncontrolled but her reports his cardiologist feels like his current BP is reasonable for him Continue Olmesartan HCT, Carvedilol and Diltiazem CBC and CMET today He will continue to follow with cardiology

## 2017-10-13 NOTE — Progress Notes (Signed)
Subjective:    Patient ID: Thomas [name]., male    DOB: 05-Jul-1981, 36 y.o.   MRN: [id number]  HPI  Pt presents to the clinic today for his annual exam. He is also due to follow up chronic conditions.  Afib: Controlled on Carvedilol and Xarelto.  HTN with Cardiomyopathy: His BP today is 144/98. He is taking Olmesartan-HCTZ, Diltiazem and Carvedilol as prescribed. He follows with Dr. Antoine Poche, note from 01/2017 reviewed.  CRI: His last creatinine was 1.69, 01/2017. He follows with  [facility].  History of Stroke: With residual left side weakness. It does not really affect his day to day life. On Olmesartan HCT, Diltiazem, Carvdeilol and Xarelto.  Flu: 09/2017 Tetanus: 12/2008 Dentist: as needed  Diet: He does eat meat. He consumes fruits and veggies daily. He does eat fried foods. He drinks mostly juice, water and sweet tea. Exercise: He walks for about 30 minutes a few days per week.   Review of Systems      Past Medical History:  Diagnosis Date  . Atrial fibrillation (HCC) 10/29/2010  . Benign essential hypertension 12/15/2008  . Cardiomyopathy 11/28/2010  . Hematuria, unspecified 12/15/2008  . History of chicken pox   . Overweight(278.02) 08/23/2010  . Palpitation 11/28/2010  . Paroxysmal SVT (supraventricular tachycardia) (HCC) 12/15/2008  . Renal insufficiency 11/28/2010  . 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 carvedilol (COREG) 12.5 MG tablet TAKE 1 TABLET BY MOUTH TWICE DAILY WITH MEALS 60 tablet 5  . diltiazem (CARTIA XT) 240 MG 24 hr capsule Take 1 capsule (240 mg total) by mouth daily. MUST SCHEDULE ANNUAL PHYSICAL 90 capsule 0  . olmesartan-hydrochlorothiazide (BENICAR HCT) 40-25 MG tablet Take 1 tablet by mouth daily. MUST SCHEDULE ANNUAL PHYSICAL 90 tablet 0  . XARELTO  20 MG TABS tablet TAKE 1 TABLET BY MOUTH ONCE DAILY WITH SUPPER 90 tablet 1   No current facility-administered medications for this visit.     No Known Allergies  Family History  Problem Relation Age of Onset  . Hypertension Mother   . Diabetes Mother   . Hypertension Father   . Glaucoma Father   . Hyperlipidemia Other        Parent    Social History   Socioeconomic History  . Marital status: Single    Spouse name: Not on file  . Number of children: Not on file  . Years of education: 68  . Highest education level: Not on file  Social Needs  . Financial resource strain: Not on file  . Food insecurity - worry: Not on file  . Food insecurity - inability: Not on file  . Transportation needs - medical: Not on file  . Transportation needs - non-medical: Not on file  Occupational History  . Occupation: Geophysicist/field seismologist: [employer]  Tobacco Use  . Smoking status: Never Smoker  . Smokeless tobacco: Never Used  Substance and Sexual Activity  . Alcohol use: Yes    Alcohol/week: 0.6 oz    Types: 1 Shots of liquor per week    Comment: rare  . Drug use: No  . Sexual activity: Yes    Birth control/protection: Condom  Other Topics Concern  . Not on file  Social History Narrative   Neither of his parents nor any siblings have any  cardiac or heart rhythm issues. He has 2 grandparents that were on blood thinners but doesn't know why.      Regular exercise-no   Caffeine Use-yes     Constitutional: Denies fever, malaise, fatigue, headache or abrupt weight changes.  HEENT: Denies eye pain, eye redness, ear pain, ringing in the ears, wax buildup, runny nose, nasal congestion, bloody nose, or sore throat. Respiratory: Denies difficulty breathing, shortness of breath, cough or sputum production.   Cardiovascular: Denies chest pain, chest tightness, palpitations or swelling in the hands or feet.  Gastrointestinal: Denies abdominal pain, bloating,  constipation, diarrhea or blood in the stool.  GU: Denies urgency, frequency, pain with urination, burning sensation, blood in urine, odor or discharge. Musculoskeletal: Denies decrease in range of motion, difficulty with gait, muscle pain or joint pain and swelling.  Skin: Denies redness, rashes, lesions or ulcercations.  Neurological: Denies dizziness, difficulty with memory, difficulty with speech or problems with balance and coordination.  Psych: Denies anxiety, depression, SI/HI.  No other specific complaints in a complete review of systems (except as listed in HPI above).  Objective:   Physical Exam  BP (!) 144/98   Pulse 67   Temp 98.4 F (36.9 C) (Oral)   Ht 6' (1.829 m)   Wt (!) 325 lb (147.4 kg)   SpO2 98%   BMI 44.08 kg/m  Wt Readings from Last 3 Encounters:  10/07/17 (!) 325 lb (147.4 kg)  02/13/17 (!) 338 lb 12.8 oz (153.7 kg)  02/04/17 (!) 337 lb (152.9 kg)    General: Appears his stated age, obese in NAD. Skin: Warm, dry and intact.  HEENT: Head: normal shape and size; Eyes: sclera white, no icterus, conjunctiva pink, PERRLA and EOMs intact; Ears: Tm's gray and intact, normal light reflex; Throat/Mouth: Teeth present, mucosa pink and moist, no exudate, lesions or ulcerations noted.  Neck:  Neck supple, trachea midline. No masses, lumps or thyromegaly present.  Cardiovascular: Normal rate with irregular. S1,S2 noted.  No murmur, rubs or gallops noted. No JVD or BLE edema.  Pulmonary/Chest: Normal effort and positive vesicular breath sounds. No respiratory distress. No wheezes, rales or ronchi noted.  Abdomen: Soft and nontender. Normal bowel sounds. No distention or masses noted. Liver, spleen and kidneys non palpable. Musculoskeletal: Strength 5/5 BUE. Strength 5/5 RLE, 4/5 LLE. No difficulty with gait.  Neurological: Alert and oriented. Cranial nerves II-XII grossly intact. Coordination normal.  Psychiatric: Mood and affect normal. Behavior is normal. Judgment and  thought content normal.    BMET    Component Value Date/Time   NA 142 10/07/2017 1615   K 4.0 10/07/2017 1615   CL 106 10/07/2017 1615   CO2 29 10/07/2017 1615   GLUCOSE 70 10/07/2017 1615   BUN 29 (H) 10/07/2017 1615   CREATININE 1.67 (H) 10/07/2017 1615   CREATININE 1.69 (H) 02/13/2017 1504   CALCIUM 9.6 10/07/2017 1615   GFRNONAA 34 (L) 12/18/2012 0625   GFRAA 39 (L) 12/18/2012 0625    Lipid Panel     Component Value Date/Time   CHOL 142 10/07/2017 1615   TRIG 118.0 10/07/2017 1615   HDL 43.40 10/07/2017 1615   CHOLHDL 3 10/07/2017 1615   VLDL 23.6 10/07/2017 1615   LDLCALC 75 10/07/2017 1615    CBC    Component Value Date/Time   WBC 4.3 10/07/2017 1615   RBC 5.12 10/07/2017 1615   HGB 15.1 10/07/2017 1615   HCT 46.6 10/07/2017 1615   PLT 147.0 (L) 10/07/2017 1615  MCV 91.0 10/07/2017 1615   MCH 30.0 02/13/2017 1504   MCHC 32.5 10/07/2017 1615   RDW 14.5 10/07/2017 1615   LYMPHSABS 1.8 12/21/2013 1621   MONOABS 0.3 12/21/2013 1621   EOSABS 0.1 12/21/2013 1621   BASOSABS 0.0 12/21/2013 1621    Hgb A1C Lab Results  Component Value Date   HGBA1C 5.8 05/21/2016            Assessment & Plan:   Preventative Health Maintenance:  Flu and tetanus UTD Encouraged him to consume a balanced diet and exercise regimen Advised him to see a dentist annually Will check CBC, CMET and Lipid profile today  RTC in 1 year, sooner if needed Nicki ReaperBAITY, Brock Larmon, NP

## 2017-10-13 NOTE — Assessment & Plan Note (Signed)
Still some slight weakness Will check CMET and lipid profile today Continue current meds

## 2017-10-13 NOTE — Assessment & Plan Note (Addendum)
Chronic CBC and CMET today He will continue to follow with Nephrology

## 2017-10-13 NOTE — Assessment & Plan Note (Signed)
Controlled on Carvedilol and Xarelto Will monitor

## 2017-10-20 ENCOUNTER — Other Ambulatory Visit: Payer: Self-pay | Admitting: Internal Medicine

## 2017-10-20 DIAGNOSIS — I10 Essential (primary) hypertension: Secondary | ICD-10-CM

## 2017-10-20 MED FILL — CARVEDILOL 12.5 MG TABS: 12.5 | 30 days supply | Qty: 60 | Fill #0

## 2017-10-20 MED FILL — OLMESARTAN-HCTZ 40-25 MG TA: 40-25 | 90 days supply | Qty: 90 | Fill #0

## 2017-10-22 ENCOUNTER — Ambulatory Visit (INDEPENDENT_AMBULATORY_CARE_PROVIDER_SITE_OTHER): Payer: 59 | Admitting: Orthotics

## 2017-10-22 DIAGNOSIS — M779 Enthesopathy, unspecified: Secondary | ICD-10-CM

## 2017-10-22 DIAGNOSIS — M205X1 Other deformities of toe(s) (acquired), right foot: Secondary | ICD-10-CM

## 2017-10-22 DIAGNOSIS — M21611 Bunion of right foot: Secondary | ICD-10-CM

## 2017-10-22 NOTE — Progress Notes (Signed)
Patient came in today to pick up custom made foot orthotics.  The goals were accomplished and the patient reported no dissatisfaction with said orthotics.  Patient was advised of breakin period and how to report any issues. 

## 2017-11-19 ENCOUNTER — Other Ambulatory Visit: Payer: Self-pay | Admitting: Internal Medicine

## 2017-11-19 DIAGNOSIS — I482 Chronic atrial fibrillation, unspecified: Secondary | ICD-10-CM

## 2017-11-19 NOTE — Telephone Encounter (Signed)
Entered in error

## 2017-11-20 MED FILL — CARVEDILOL 12.5 MG TABS: 12.5 | 90 days supply | Qty: 180 | Fill #1

## 2017-11-20 MED FILL — CARTIA XT 240 MG CAPSULE: 240 | 90 days supply | Qty: 90 | Fill #0

## 2018-01-12 MED FILL — XARELTO 20 MG TABLET: 20 | 90 days supply | Qty: 90 | Fill #1

## 2018-01-12 MED FILL — OLMESARTAN-HCTZ 40-25 MG TA: 40-25 | 90 days supply | Qty: 90 | Fill #1

## 2018-02-10 ENCOUNTER — Other Ambulatory Visit: Payer: Self-pay

## 2018-02-10 ENCOUNTER — Ambulatory Visit (HOSPITAL_COMMUNITY)
Admission: EM | Admit: 2018-02-10 | Discharge: 2018-02-10 | Disposition: A | Discharge status: Home / Self Care | Payer: No Typology Code available for payment source | Attending: Family Medicine | Admitting: Family Medicine

## 2018-02-10 ENCOUNTER — Encounter (HOSPITAL_COMMUNITY): Payer: Self-pay | Admitting: Emergency Medicine

## 2018-02-10 DIAGNOSIS — M25561 Pain in right knee: Secondary | ICD-10-CM | POA: Diagnosis not present

## 2018-02-10 LAB — URIC ACID: URIC ACID, SERUM: 7.7 mg/dL — AB (ref 4.4–7.6)

## 2018-02-10 MED ORDER — PREDNISONE 20 MG PO TABS: 40.0000 mg | ORAL_TABLET | Freq: Every day | ORAL | 0 refills | Status: AC

## 2018-02-10 MED FILL — predniSONE 20 MG TABS: 20 | 5 days supply | Qty: 10 | Fill #0

## 2018-02-10 NOTE — ED Triage Notes (Signed)
Woke Sunday with right knee pain.  No known injury.  Each day, pain has worsened.

## 2018-02-10 NOTE — Discharge Instructions (Signed)
Please begin prednisone 40 mg daily for the next 5 days.  We are checking for gout from the blood that we drew today.  If your blood pressure readings come down, you may use Aleve.

## 2018-02-10 NOTE — ED Provider Notes (Signed)
[facility]    CSN: [id number] Arrival date & time: 02/10/18  1346     History   Chief Complaint Chief Complaint  Patient presents with  . Knee Pain    HPI Thomas [name]. is a 37 y.o. male history of hypertension, chronic A. fib, previous CVA presenting today with right knee pain.  States that his pain began approximately 3 days ago.  Has felt warm to touch.  States that it is painful more so when he is weightbearing and walking.  Occasionally will lock up or buckle.  States that he has been going to the gym more frequently approximately 2-3 times a week, but will usually do circuit training or elliptical.  He states he has minimal impact.  States he has full extension, but limited in flexion.  Has taken Tylenol for pain.  Denies history of gout.  He does not feel his knee has been increasingly red.  Patient has taken blood pressure medicine today, denies current symptoms, denies headache, changes in vision, one-sided weakness, difficulty speaking, decreased urine output, chest pain.  HPI  Past Medical History:  Diagnosis Date  . Atrial fibrillation (HCC) 10/29/2010  . Benign essential hypertension 12/15/2008  . Cardiomyopathy 11/28/2010  . History of chicken pox   . Renal insufficiency 11/28/2010    Patient Active Problem List   Diagnosis Date Noted  . Atrial fibrillation with RVR (HCC) 12/08/2012  . 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 Renal insufficiency 11/28/2010    History reviewed. No pertinent surgical history.     Home Medications    Prior to Admission medications   Medication Sig Start Date End Date Taking? Authorizing Provider  carvedilol (COREG) 12.5 MG tablet TAKE 1 TABLET BY MOUTH TWICE DAILY WITH MEALS 10/20/17   Baity, Salvadore Oxford, NP  diltiazem (CARTIA XT) 240 MG 24 hr capsule Take 1 capsule (240 mg total) by mouth daily. 11/20/17   Lorre Munroe, NP    olmesartan-hydrochlorothiazide (BENICAR HCT) 40-25 MG tablet TAKE 1 TABLET BY MOUTH DAILY. MUST SCHEDULE ANNUAL PHYSICAL 10/20/17   Lorre Munroe, NP  predniSONE (DELTASONE) 20 MG tablet Take 2 tablets (40 mg total) by mouth daily for 5 days. 02/10/18 02/15/18  Kelen Laura C, PA-C  XARELTO 20 MG TABS tablet TAKE 1 TABLET BY MOUTH ONCE DAILY WITH SUPPER 05/16/17   Lorre Munroe, NP    Family History Family History  Problem Relation Age of Onset  . Hypertension Mother   . Diabetes Mother   . Hypertension Father   . Glaucoma Father   . Hyperlipidemia Other        Parent    Social History Social History   Tobacco Use  . Smoking status: Never Smoker  . Smokeless tobacco: Never Used  Substance Use Topics  . Alcohol use: Yes    Alcohol/week: 0.6 oz    Types: 1 Shots of liquor per week    Comment: rare  . Drug use: No     Allergies   Patient has no known allergies.   Review of Systems Review of Systems  Constitutional: Negative for fatigue and fever.  Eyes: Negative for visual disturbance.  Respiratory: Negative for shortness of breath.   Cardiovascular: Negative for chest pain.  Gastrointestinal: Negative for abdominal pain, nausea and vomiting.  Musculoskeletal: Positive for arthralgias, gait problem and myalgias. Negative for joint swelling.  Skin: Negative for color change and wound.  Neurological: Negative  for dizziness, speech difficulty, weakness, light-headedness and headaches.     Physical Exam Triage Vital Signs ED Triage Vitals  Enc Vitals Group     BP 02/10/18 1452 (!) 185/127     Pulse Rate 02/10/18 1452 98     Resp 02/10/18 1452 20     Temp 02/10/18 1452 98.3 F (36.8 C)     Temp Source 02/10/18 1452 Oral     SpO2 02/10/18 1452 100 %     Weight --      Height --      Head Circumference --      Peak Flow --      Pain Score 02/10/18 1451 8     Pain Loc --      Pain Edu? --      Excl. in GC? --    No data found.  Updated Vital Signs BP  (!) 185/127 (BP Location: Left Arm) Comment (BP Location): regular cuff, forearm  Pulse 98   Temp 98.3 F (36.8 C) (Oral)   Resp 20   SpO2 100%  Blood pressure rechecked at 167/130.  Pulse variable averaging around 72. Visual Acuity Right Eye Distance:   Left Eye Distance:   Bilateral Distance:    Right Eye Near:   Left Eye Near:    Bilateral Near:     Physical Exam  Constitutional: He appears well-developed and well-nourished.  HENT:  Head: Normocephalic and atraumatic.  Eyes: Conjunctivae are normal.  Neck: Neck supple.  Cardiovascular:  Irregularly irregular  Pulmonary/Chest: Effort normal and breath sounds normal. No respiratory distress.  Musculoskeletal: He exhibits no edema.  Right knee: Increased warmth to touch compared to left, no increased swelling or redness.  Patient has relatively normal range of motion although slower compared to left.  Knee appears stable without laxity, negative Lachman's, anterior and posterior drawer, McMurray's.  Knee is very limited due to body habitus.  Neurological: He is alert.  Skin: Skin is warm and dry.  Psychiatric: He has a normal mood and affect.  Nursing note and vitals reviewed.    UC Treatments / Results  Labs (all labs ordered are listed, but only abnormal results are displayed) Labs Reviewed  URIC ACID    EKG  EKG Interpretation None       Radiology No results found.  Procedures Procedures (including critical care time)  Medications Ordered in UC Medications - No data to display   Initial Impression / Assessment and Plan / UC Course  I have reviewed the triage vital signs and the nursing notes.  Pertinent labs & imaging results that were available during my care of the patient were reviewed by me and considered in my medical decision making (see chart for details).     Knee likely inflammatory versus gout.  Will check for uric acid.  Will provide prednisone given patient is not diabetic, patient on  Xarelto and relatively high blood pressure at visit.  Will avoid NSAIDs.  Advised patient to monitor blood pressure at home and follow-up with PCP or here.  Discussed warning signs given his high blood pressure. Discussed strict return precautions. Patient verbalized understanding and is agreeable with plan.   Final Clinical Impressions(s) / UC Diagnoses   Final diagnoses:  Acute pain of right knee    ED Discharge Orders        Ordered    predniSONE (DELTASONE) 20 MG tablet  Daily     02/10/18 1600       Controlled Substance  Prescriptions [place] Controlled Substance Registry consulted? Not Applicable   Lew Dawes, [place] 02/10/18 2029

## 2018-02-27 MED FILL — CARTIA XT 240 MG CAPSULE: 240 | 90 days supply | Qty: 90 | Fill #1

## 2018-04-10 MED FILL — CARVEDILOL 12.5 MG TABS: 12.5 | 90 days supply | Qty: 180 | Fill #2

## 2018-05-13 MED FILL — OLMESARTAN-HCTZ 40-25 MG TA: 40-25 | 30 days supply | Qty: 30 | Fill #2

## 2018-06-11 ENCOUNTER — Other Ambulatory Visit: Payer: Self-pay | Admitting: Internal Medicine

## 2018-06-11 DIAGNOSIS — I482 Chronic atrial fibrillation, unspecified: Secondary | ICD-10-CM

## 2018-06-11 MED FILL — OLMESARTAN-HCTZ 40-25 MG TA: 40-25 | 90 days supply | Qty: 90 | Fill #3

## 2018-06-11 MED FILL — CARTIA XT 240 MG CAPSULE: 240 | 90 days supply | Qty: 90 | Fill #0

## 2018-07-06 ENCOUNTER — Other Ambulatory Visit: Payer: Self-pay | Admitting: Internal Medicine

## 2018-07-06 DIAGNOSIS — I482 Chronic atrial fibrillation, unspecified: Secondary | ICD-10-CM

## 2018-07-07 NOTE — Telephone Encounter (Signed)
Last filled 05/2017... Please advise if okay to refill

## 2018-07-29 MED FILL — XARELTO 20 MG TABLET: 20 | 90 days supply | Qty: 90 | Fill #0

## 2018-09-15 ENCOUNTER — Other Ambulatory Visit: Payer: Self-pay | Admitting: Internal Medicine

## 2018-09-15 DIAGNOSIS — I10 Essential (primary) hypertension: Secondary | ICD-10-CM

## 2018-09-15 DIAGNOSIS — I482 Chronic atrial fibrillation, unspecified: Secondary | ICD-10-CM

## 2018-09-15 MED FILL — OLMESARTAN-HCTZ 40-25 MG TA: 40-25 | 60 days supply | Qty: 60 | Fill #4

## 2018-09-15 MED FILL — CARTIA XT 240 MG CAPSULE SA: 240 | 90 days supply | Qty: 90 | Fill #0

## 2018-09-15 MED FILL — CARVEDILOL 12.5 MG TABLET: 12.5 | 90 days supply | Qty: 180 | Fill #3

## 2018-10-08 ENCOUNTER — Encounter: Payer: Self-pay | Admitting: Internal Medicine

## 2018-10-08 DIAGNOSIS — Z0289 Encounter for other administrative examinations: Secondary | ICD-10-CM

## 2018-10-26 ENCOUNTER — Ambulatory Visit: Payer: Self-pay | Admitting: Podiatry

## 2018-10-26 ENCOUNTER — Ambulatory Visit (INDEPENDENT_AMBULATORY_CARE_PROVIDER_SITE_OTHER): Payer: No Typology Code available for payment source | Admitting: Orthotics

## 2018-10-26 DIAGNOSIS — M21611 Bunion of right foot: Secondary | ICD-10-CM

## 2018-10-26 DIAGNOSIS — M779 Enthesopathy, unspecified: Secondary | ICD-10-CM

## 2018-10-26 DIAGNOSIS — M205X1 Other deformities of toe(s) (acquired), right foot: Secondary | ICD-10-CM

## 2018-10-26 NOTE — Progress Notes (Signed)
Patient wants exact repeat from 2018 f/o.  Richy same specs.   Dawn will call and he can p/up.  No appointment necessary

## 2018-11-16 ENCOUNTER — Encounter: Payer: Self-pay | Admitting: Internal Medicine

## 2018-11-16 ENCOUNTER — Ambulatory Visit (INDEPENDENT_AMBULATORY_CARE_PROVIDER_SITE_OTHER): Payer: No Typology Code available for payment source | Admitting: Internal Medicine

## 2018-11-16 VITALS — BP 158/96 | HR 74 | Temp 97.9°F | Wt 339.0 lb

## 2018-11-16 DIAGNOSIS — M109 Gout, unspecified: Secondary | ICD-10-CM | POA: Diagnosis not present

## 2018-11-16 MED ORDER — PREDNISONE 10 MG PO TABS: ORAL_TABLET | ORAL | 0 refills | Status: DC

## 2018-11-16 MED FILL — predniSONE 10 MG (21) TBPK: 10 | 6 days supply | Qty: 21 | Fill #0

## 2018-11-16 NOTE — Patient Instructions (Signed)

## 2018-11-16 NOTE — Progress Notes (Signed)
Subjective:    Patient ID: Thomas Quant., male    DOB: 12-08-1980, 37 y.o.   MRN: [id number]  HPI  Pt presents to the clinic today with c/o toe pain. He reports the pain is in his left 2nd, 3rd and 4th toes. He noticed this 3 days ago. He describes the pain as pins and needles. He reports the toe is warm and tender to touch. He denies any trauma to the area. He has not recently cut his nails. He reports he has had gout in his right hill and this feels similar. He has not tried anything OTC for his symptoms. History of CKD.  Review of Systems  Past Medical History:  Diagnosis Date  . Atrial fibrillation (HCC) 10/29/2010  . Benign essential hypertension 12/15/2008  . Cardiomyopathy 11/28/2010  . History of chicken pox   . Renal insufficiency 11/28/2010    Current Outpatient Medications  Medication Sig Dispense Refill  . CARTIA XT 240 MG 24 hr capsule TAKE 1 CAPSULE BY MOUTH ONCE DAILY 90 capsule 0  . carvedilol (COREG) 12.5 MG tablet TAKE 1 TABLET BY MOUTH TWICE DAILY WITH MEALS 60 tablet 11  . olmesartan-hydrochlorothiazide (BENICAR HCT) 40-25 MG tablet TAKE 1 TABLET BY MOUTH DAILY. MUST SCHEDULE ANNUAL PHYSICAL 90 tablet 0  . XARELTO 20 MG TABS tablet TAKE 1 TABLET BY MOUTH ONCE DAILY WITH SUPPER 90 tablet 1   No current facility-administered medications for this visit.     No Known Allergies  Family History  Problem Relation Age of Onset  . Hypertension Mother   . Diabetes Mother   . Hypertension Father   . Glaucoma Father   . Hyperlipidemia Other        Parent    Social History   Socioeconomic History  . Marital status: Single    Spouse name: Not on file  . Number of children: Not on file  . Years of education: 83  . Highest education level: Not on file  Occupational History  . Occupation: Geophysicist/field seismologist: [employer]  Social Needs  . Financial resource strain: Not on file  . Food insecurity:    Worry: Not on file   Inability: Not on file  . Transportation needs:    Medical: Not on file    Non-medical: Not on file  Tobacco Use  . Smoking status: Never Smoker  . Smokeless tobacco: Never Used  Substance and Sexual Activity  . Alcohol use: Yes    Alcohol/week: 1.0 standard drinks    Types: 1 Shots of liquor per week    Comment: rare  . Drug use: No  . Sexual activity: Yes    Birth control/protection: Condom  Lifestyle  . Physical activity:    Days per week: Not on file    Minutes per session: Not on file  . Stress: Not on file  Relationships  . Social connections:    Talks on phone: Not on file    Gets together: Not on file    Attends religious service: Not on file    Active member of club or organization: Not on file    Attends meetings of clubs or organizations: Not on file    Relationship status: Not on file  . Intimate partner violence:    Fear of current or ex partner: Not on file    Emotionally abused: Not on file    Physically abused: Not on file    Forced sexual activity:  Not on file  Other Topics Concern  . Not on file  Social History Narrative   Neither of his parents nor any siblings have any cardiac or heart rhythm issues. He has 2 grandparents that were on blood thinners but doesn't know why.      Regular exercise-no   Caffeine Use-yes     Constitutional: Denies fever, malaise, fatigue, headache or abrupt weight changes.  Musculoskeletal: Pt reports toe pain and swelling. Denies decrease in range of motion, difficulty with gait, muscle pain.  Skin: Pt reports redness and warmth of toes of left foot. Denies redness, rashes, lesions or ulcercations.    No other specific complaints in a complete review of systems (except as listed in HPI above).     Objective:   Physical Exam   BP (!) 158/96   Pulse 74   Temp 97.9 F (36.6 C) (Oral)   Wt (!) 339 lb (153.8 kg)   SpO2 96%   BMI 45.98 kg/m  Wt Readings from Last 3 Encounters:  11/16/18 (!) 339 lb (153.8 kg)    10/07/17 (!) 325 lb (147.4 kg)  02/13/17 (!) 338 lb 12.8 oz (153.7 kg)    General: Appears his stated age, obese, in NAD. Skin: Warm, dry and intact. Erythema noted at base of 2nd, 3rd and 4th toes. Musculoskeletal: Normal flexion and extension of the toes. Swelling noted of the 2nd, 3rd and 4th toes, left foot. Tenderness noted with palpation. Normal gait.   BMET    Component Value Date/Time   NA 142 10/07/2017 1615   K 4.0 10/07/2017 1615   CL 106 10/07/2017 1615   CO2 29 10/07/2017 1615   GLUCOSE 70 10/07/2017 1615   BUN 29 (H) 10/07/2017 1615   CREATININE 1.67 (H) 10/07/2017 1615   CREATININE 1.69 (H) 02/13/2017 1504   CALCIUM 9.6 10/07/2017 1615   GFRNONAA 34 (L) 12/18/2012 0625   GFRAA 39 (L) 12/18/2012 0625    Lipid Panel     Component Value Date/Time   CHOL 142 10/07/2017 1615   TRIG 118.0 10/07/2017 1615   HDL 43.40 10/07/2017 1615   CHOLHDL 3 10/07/2017 1615   VLDL 23.6 10/07/2017 1615   LDLCALC 75 10/07/2017 1615    CBC    Component Value Date/Time   WBC 4.3 10/07/2017 1615   RBC 5.12 10/07/2017 1615   HGB 15.1 10/07/2017 1615   HCT 46.6 10/07/2017 1615   PLT 147.0 (L) 10/07/2017 1615   MCV 91.0 10/07/2017 1615   MCH 30.0 02/13/2017 1504   MCHC 32.5 10/07/2017 1615   RDW 14.5 10/07/2017 1615   LYMPHSABS 1.8 12/21/2013 1621   MONOABS 0.3 12/21/2013 1621   EOSABS 0.1 12/21/2013 1621   BASOSABS 0.0 12/21/2013 1621    Hgb A1C Lab Results  Component Value Date   HGBA1C 5.8 05/21/2016           Assessment & Plan:   Gout:  RX for Pred Taper x 6 days Encouraged elevation, stay off it as much as possible  Return precautions discussed Nicki Reaper, NP

## 2018-12-21 ENCOUNTER — Other Ambulatory Visit: Payer: Self-pay | Admitting: Internal Medicine

## 2018-12-21 DIAGNOSIS — I10 Essential (primary) hypertension: Secondary | ICD-10-CM

## 2018-12-23 MED FILL — OLMESARTAN-HCTZ 40-25 MG TA: 40-25 | 90 days supply | Qty: 90 | Fill #0

## 2019-01-06 ENCOUNTER — Encounter: Payer: No Typology Code available for payment source | Admitting: Internal Medicine

## 2019-01-06 DIAGNOSIS — Z0289 Encounter for other administrative examinations: Secondary | ICD-10-CM

## 2019-02-01 ENCOUNTER — Other Ambulatory Visit: Payer: Self-pay | Admitting: Internal Medicine

## 2019-02-01 DIAGNOSIS — I482 Chronic atrial fibrillation, unspecified: Secondary | ICD-10-CM

## 2019-02-01 MED ORDER — DILTIAZEM HCL ER COATED BEADS 240 MG PO CP24
240.0000 mg | ORAL_CAPSULE | Freq: Every day | ORAL | 0 refills | Status: DC
Start: 2019-02-01 — End: 2019-03-08

## 2019-02-01 MED FILL — CARTIA XT 240 MG CAPSULE SA: 240 | 30 days supply | Qty: 30 | Fill #0

## 2019-02-09 ENCOUNTER — Other Ambulatory Visit: Payer: Self-pay | Admitting: Internal Medicine

## 2019-02-10 ENCOUNTER — Telehealth: Payer: Self-pay

## 2019-02-10 MED FILL — CARVEDILOL 12.5 MG TABLET: 12.5 | 30 days supply | Qty: 60 | Fill #0

## 2019-02-10 NOTE — Telephone Encounter (Signed)
Rx was last filled 10/2017 w/ 11 refills, at CPE appt, no upcoming appts scheduled, last OV acute gout 11/2018... please advise

## 2019-02-10 NOTE — Telephone Encounter (Signed)
30 day supply. Must schedule CPX

## 2019-02-10 NOTE — Telephone Encounter (Signed)
Attempted to call, no answer. I sent a MyChart message

## 2019-02-10 NOTE — Telephone Encounter (Signed)
[facility] Night - Client Nonclinical Telephone Record [facility] Medical Call Center Client [facility] Night - Client Client Site [facility] - Night Physician Nicki Reaper - NP Contact Type Call Who Is Calling Patient / Member / Family / Caregiver Caller Name Angelluis Keath Caller Phone Number [phone] Patient Name Thomas [name] Patient DOB 1981/08/14 Call Type Message Only Information Provided Reason for Call Request to Schedule Office Appointment Initial Comment Caller states that he needs to set up an appt for a yearly check-up and an Rx refill. Additional Comment Office hours provided. Declined triage and advised to call back in the morning. Call Closed By: Dorena Cookey Transaction Date/Time: 02/09/2019 5:05:39 PM (ET)

## 2019-02-15 ENCOUNTER — Other Ambulatory Visit: Payer: Self-pay | Admitting: Internal Medicine

## 2019-02-15 DIAGNOSIS — I482 Chronic atrial fibrillation, unspecified: Secondary | ICD-10-CM

## 2019-02-15 MED FILL — XARELTO 20 MG TABLET: 20 | 90 days supply | Qty: 90 | Fill #1

## 2019-02-22 ENCOUNTER — Other Ambulatory Visit: Payer: Self-pay | Admitting: Internal Medicine

## 2019-02-22 NOTE — Telephone Encounter (Signed)
Pt called to check status of medication refill.   °

## 2019-02-23 ENCOUNTER — Ambulatory Visit (INDEPENDENT_AMBULATORY_CARE_PROVIDER_SITE_OTHER): Payer: No Typology Code available for payment source | Admitting: Internal Medicine

## 2019-02-23 ENCOUNTER — Encounter: Payer: Self-pay | Admitting: Internal Medicine

## 2019-02-23 ENCOUNTER — Other Ambulatory Visit: Payer: Self-pay

## 2019-02-23 ENCOUNTER — Other Ambulatory Visit: Payer: Self-pay | Admitting: Internal Medicine

## 2019-02-23 DIAGNOSIS — M25572 Pain in left ankle and joints of left foot: Secondary | ICD-10-CM | POA: Diagnosis not present

## 2019-02-23 DIAGNOSIS — M25472 Effusion, left ankle: Secondary | ICD-10-CM

## 2019-02-23 MED ORDER — PREDNISONE 10 MG PO TABS: ORAL_TABLET | ORAL | 0 refills | Status: DC

## 2019-02-23 MED FILL — predniSONE 10 MG TABS: 10 | 6 days supply | Qty: 21 | Fill #0

## 2019-02-23 NOTE — Patient Instructions (Signed)

## 2019-02-23 NOTE — Progress Notes (Signed)
Virtual Visit via Video Note  I connected with Thomas [name]. on 02/23/19 at 10:15 AM EDT by a video enabled telemedicine application and verified that I am speaking with the correct person using two identifiers.   I discussed the limitations of evaluation and management by telemedicine and the availability of in person appointments. The patient expressed understanding and agreed to proceed.  History of Present Illness: Pt reports left ankle pain and swelling. This started 4 days ago. He describes the pain as sharp, grinding. The pain is worse with ambulation. He reports associated redness and warmth. He denies any injury to the area. He thinks it is gout. He had a uric acid level of 7.7, 01/2018. He was treated for a gout flare 11/2018 with Prednisone. He does have a history of CKD, but GFR> 60, creatinine clearance 130.43. He is not currently taking any prophylactic medication for gout.    Observations/Objective: Alert and oriented x 3. Engages fully. Pain with flexion, extension and rotation of the ankle. Pain with palpation over the medial malleolus.   Assessment and Plan:  Left Ankle Pain and Swelling:  Feels like previous gout per pt report RX for Pred Taper x 6 days Consider changing Olmesartan HCT to Olmesartan if he has another flare Consider uric acid level, Allopurinol 100 mg once daily when flare is over Encouraged elevation, ice to help reduce swelling   Follow Up Instructions:    I discussed the assessment and treatment plan with the patient. The patient was provided an opportunity to ask questions and all were answered. The patient agreed with the plan and demonstrated an understanding of the instructions.   The patient was advised to call back or seek an in-person evaluation if the symptoms worsen or if the condition fails to improve as anticipated.  I provided 7 minutes of non-face-to-face time during this encounter.   Nicki Reaper, NP

## 2019-03-05 ENCOUNTER — Other Ambulatory Visit: Payer: Self-pay | Admitting: Internal Medicine

## 2019-03-05 DIAGNOSIS — I482 Chronic atrial fibrillation, unspecified: Secondary | ICD-10-CM

## 2019-03-08 MED FILL — CARTIA XT 240 MG CAPSULE SA: 240 | 90 days supply | Qty: 90 | Fill #0

## 2019-03-29 ENCOUNTER — Other Ambulatory Visit: Payer: Self-pay | Admitting: Internal Medicine

## 2019-03-30 ENCOUNTER — Telehealth: Payer: Self-pay

## 2019-03-30 MED FILL — CARVEDILOL 12.5 MG TABLET: 12.5 | 90 days supply | Qty: 180 | Fill #0

## 2019-03-30 NOTE — Telephone Encounter (Signed)
Copied from CRM [id number]. Topic: Quick Communication - See Telephone Encounter >> Mar 29, 2019  4:53 PM Aretta Nip wrote: CRM for notification. See Telephone encounter for: 03/29/19. Pt called after hours and wants a call back to sch his CPE with [facility]. Please reach out to pt for sch . Would prefer afternoons.

## 2019-03-30 NOTE — Telephone Encounter (Signed)
I left voicemail for pt letting him know an appt was already scheduled 6/8 at 3:15pm. If any concerns or questions please call back.

## 2019-03-31 ENCOUNTER — Encounter: Payer: Self-pay | Admitting: Internal Medicine

## 2019-04-01 ENCOUNTER — Encounter: Payer: Self-pay | Admitting: Internal Medicine

## 2019-04-01 ENCOUNTER — Ambulatory Visit (INDEPENDENT_AMBULATORY_CARE_PROVIDER_SITE_OTHER): Payer: No Typology Code available for payment source | Admitting: Internal Medicine

## 2019-04-01 ENCOUNTER — Telehealth: Payer: Self-pay

## 2019-04-01 DIAGNOSIS — I10 Essential (primary) hypertension: Secondary | ICD-10-CM | POA: Diagnosis not present

## 2019-04-01 DIAGNOSIS — M10272 Drug-induced gout, left ankle and foot: Secondary | ICD-10-CM

## 2019-04-01 MED ORDER — COLCHICINE 0.6 MG PO TABS: ORAL_TABLET | ORAL | 2 refills | Status: DC

## 2019-04-01 MED ORDER — OLMESARTAN MEDOXOMIL 40 MG PO TABS: 40.0000 mg | ORAL_TABLET | Freq: Every day | ORAL | 1 refills | Status: DC

## 2019-04-01 MED ORDER — COLCHICINE 0.6 MG PO TABS
ORAL_TABLET | ORAL | 2 refills | Status: DC
Start: 2019-04-01 — End: 2019-04-01

## 2019-04-01 MED ORDER — ALLOPURINOL 100 MG PO TABS: 100.0000 mg | ORAL_TABLET | Freq: Every day | ORAL | 6 refills | Status: DC

## 2019-04-01 MED FILL — ALLOPURINOL 100 MG TABS: 100 | 30 days supply | Qty: 30 | Fill #0

## 2019-04-01 MED FILL — COLCHICINE 0.6 MG TABS: 0.6 | 30 days supply | Qty: 30 | Fill #0

## 2019-04-01 MED FILL — OLMESARTAN MEDOXOMIL 40 MG: 40 | 30 days supply | Qty: 30 | Fill #0

## 2019-04-01 NOTE — Progress Notes (Signed)
Virtual Visit via Video Note  I connected with Thomas [name]. on 04/01/19 at 11:30 AM EDT by a video enabled telemedicine application and verified that I am speaking with the correct person using two identifiers.  Location: Patient: Work Provider: Office   I discussed the limitations of evaluation and management by telemedicine and the availability of in person appointments. The patient expressed understanding and agreed to proceed.  History of Present Illness:  Pt presents to the clinic today with c/o pain and swelling in his left ankle. He reports this started 4-5 days ago. He denies any injury to the area. He thinks he is having another gout flare. He took Prednisone 3/24 with good relief. His uric acid level from 01/2019 was elevated. He stopped his Benicar HCT because he felt like this was contributing. He denies increased headaches, dizziness or visual changes. He has not been monitoring his blood pressure at home. He has taken Ibuprofen for his left ankle pain with minimal improvement.  Past Medical History:  Diagnosis Date  . Atrial fibrillation (HCC) 10/29/2010  . Benign essential hypertension 12/15/2008  . Cardiomyopathy 11/28/2010  . History of chicken pox   . Renal insufficiency 11/28/2010    Current Outpatient Medications  Medication Sig Dispense Refill  . CARTIA XT 240 MG 24 hr capsule TAKE 1 CAPSULE BY MOUTH ONCE DAILY (MUST SCHEDULE ANNUAL EXAM) 90 capsule 0  . carvedilol (COREG) 12.5 MG tablet TAKE 1 TABLET BY MOUTH TWO TIMES DAILY WITTH A MEAL (NEEDS OV) 180 tablet 0  . olmesartan-hydrochlorothiazide (BENICAR HCT) 40-25 MG tablet TAKE 1 TABLET BY MOUTH DAILY. MUST SCHEDULE ANNUAL PHYSICAL 90 tablet 0  . predniSONE (DELTASONE) 10 MG tablet Take 6 tabs day 1, 5 tabs day 2, 4 tabs day 3, 3 tabs day 4, 2 tabs day 5, 1 tab day 6 (Patient not taking: Reported on 02/23/2019) 21 tablet 0  . XARELTO 20 MG TABS tablet TAKE 1 TABLET BY MOUTH ONCE DAILY WITH SUPPER 90 tablet 1    No current facility-administered medications for this visit.     No Known Allergies  Family History  Problem Relation Age of Onset  . Hypertension Mother   . Diabetes Mother   . Hypertension Father   . Glaucoma Father   . Hyperlipidemia Other        Parent    Social History   Socioeconomic History  . Marital status: Single    Spouse name: Not on file  . Number of children: Not on file  . Years of education: 39  . Highest education level: Not on file  Occupational History  . Occupation: Geophysicist/field seismologist:  [employer]  Social Needs  . Financial resource strain: Not on file  . Food insecurity:    Worry: Not on file    Inability: Not on file  . Transportation needs:    Medical: Not on file    Non-medical: Not on file  Tobacco Use  . Smoking status: Never Smoker  . Smokeless tobacco: Never Used  Substance and Sexual Activity  . Alcohol use: Yes    Alcohol/week: 1.0 standard drinks    Types: 1 Shots of liquor per week    Comment: rare  . Drug use: No  . Sexual activity: Yes    Birth control/protection: Condom  Lifestyle  . Physical activity:    Days per week: Not on file    Minutes per session: Not on file  . Stress: Not  on file  Relationships  . Social connections:    Talks on phone: Not on file    Gets together: Not on file    Attends religious service: Not on file    Active member of club or organization: Not on file    Attends meetings of clubs or organizations: Not on file    Relationship status: Not on file  . Intimate partner violence:    Fear of current or ex partner: Not on file    Emotionally abused: Not on file    Physically abused: Not on file    Forced sexual activity: Not on file  Other Topics Concern  . Not on file  Social History Narrative   Neither of his parents nor any siblings have any cardiac or heart rhythm issues. He has 2 grandparents that were on blood thinners but doesn't know why.      Regular  exercise-no   Caffeine Use-yes     Constitutional: Denies fever, malaise, fatigue, headache or abrupt weight changes.  Musculoskeletal: Pt reports left ankle pain and swelling. Denies decrease in range of motion, difficulty with gait, muscle pain.   No other specific complaints in a complete review of systems (except as listed in HPI above).   Wt Readings from Last 3 Encounters:  11/16/18 (!) 339 lb (153.8 kg)  10/07/17 (!) 325 lb (147.4 kg)  02/13/17 (!) 338 lb 12.8 oz (153.7 kg)    General: Appears his stated age, obese in NAD. Skin: Unable to visualize redness of the left ankle. Pulmonary/Chest: Normal effort. No respiratory distress.  Musculoskeletal: Mild swelling of left ankle. Gait not visualized. Neurological: Alert and oriented.    BMET    Component Value Date/Time   NA 142 10/07/2017 1615   K 4.0 10/07/2017 1615   CL 106 10/07/2017 1615   CO2 29 10/07/2017 1615   GLUCOSE 70 10/07/2017 1615   BUN 29 (H) 10/07/2017 1615   CREATININE 1.67 (H) 10/07/2017 1615   CREATININE 1.69 (H) 02/13/2017 1504   CALCIUM 9.6 10/07/2017 1615   GFRNONAA 34 (L) 12/18/2012 0625   GFRAA 39 (L) 12/18/2012 0625    Lipid Panel     Component Value Date/Time   CHOL 142 10/07/2017 1615   TRIG 118.0 10/07/2017 1615   HDL 43.40 10/07/2017 1615   CHOLHDL 3 10/07/2017 1615   VLDL 23.6 10/07/2017 1615   LDLCALC 75 10/07/2017 1615    CBC    Component Value Date/Time   WBC 4.3 10/07/2017 1615   RBC 5.12 10/07/2017 1615   HGB 15.1 10/07/2017 1615   HCT 46.6 10/07/2017 1615   PLT 147.0 (L) 10/07/2017 1615   MCV 91.0 10/07/2017 1615   MCH 30.0 02/13/2017 1504   MCHC 32.5 10/07/2017 1615   RDW 14.5 10/07/2017 1615   LYMPHSABS 1.8 12/21/2013 1621   MONOABS 0.3 12/21/2013 1621   EOSABS 0.1 12/21/2013 1621   BASOSABS 0.0 12/21/2013 1621    Hgb A1C Lab Results  Component Value Date   HGBA1C 5.8 05/21/2016         Assessment and Plan:  Gout, Left Ankle:  RX for  Colchicine as needed for flares RX for Allopurinol to start once this flare is over. Advised starting Allopurinol during a flare can cause it to worsen Will repeat uric acid level in 2 months  HTN:  Stop Benicar HCT Will start Benicar 40 mg daily Reinforced DASH diet and exercise for weight loss  Follow Up Instructions:    I  discussed the assessment and treatment plan with the patient. The patient was provided an opportunity to ask questions and all were answered. The patient agreed with the plan and demonstrated an understanding of the instructions.   The patient was advised to call back or seek an in-person evaluation if the symptoms worsen or if the condition fails to improve as anticipated.     Nicki Reaper, NP

## 2019-04-01 NOTE — Patient Instructions (Signed)

## 2019-04-01 NOTE — Telephone Encounter (Signed)
Spoke to [place] and she stated that the dose needs to be cut in half... sig changes and resent to the pharmacy... VO from [facility] to send in per pharmacy recommendation

## 2019-04-01 NOTE — Telephone Encounter (Signed)
Thomas [name] at [facility] outpt pharmacy left v/m; pt was prescribed colchicine. Pt is also on diltiazem (cartia) and coreg. These drugs may increase concentration of colchicine. Manufacturer recommends adjusting dose of colchicine. Please advise.

## 2019-04-28 MED FILL — COLCHICINE 0.6 MG TABS: 0.6 | 30 days supply | Qty: 30 | Fill #1

## 2019-04-28 MED FILL — OLMESARTAN MEDOXOMIL 40 MG: 40 | 30 days supply | Qty: 30 | Fill #1

## 2019-04-28 MED FILL — ALLOPURINOL 100 MG TABS: 100 | 30 days supply | Qty: 30 | Fill #1

## 2019-05-10 ENCOUNTER — Ambulatory Visit (INDEPENDENT_AMBULATORY_CARE_PROVIDER_SITE_OTHER): Payer: No Typology Code available for payment source | Admitting: Internal Medicine

## 2019-05-10 ENCOUNTER — Encounter: Payer: Self-pay | Admitting: Internal Medicine

## 2019-05-10 ENCOUNTER — Other Ambulatory Visit: Payer: Self-pay

## 2019-05-10 VITALS — BP 162/110 | HR 82 | Temp 98.1°F | Ht 71.75 in | Wt 312.0 lb

## 2019-05-10 DIAGNOSIS — I10 Essential (primary) hypertension: Secondary | ICD-10-CM | POA: Diagnosis not present

## 2019-05-10 DIAGNOSIS — I4891 Unspecified atrial fibrillation: Secondary | ICD-10-CM | POA: Diagnosis not present

## 2019-05-10 DIAGNOSIS — I69359 Hemiplegia and hemiparesis following cerebral infarction affecting unspecified side: Secondary | ICD-10-CM | POA: Diagnosis not present

## 2019-05-10 DIAGNOSIS — M109 Gout, unspecified: Secondary | ICD-10-CM | POA: Insufficient documentation

## 2019-05-10 DIAGNOSIS — N183 Chronic kidney disease, stage 3 unspecified: Secondary | ICD-10-CM

## 2019-05-10 DIAGNOSIS — Z0000 Encounter for general adult medical examination without abnormal findings: Secondary | ICD-10-CM | POA: Diagnosis not present

## 2019-05-10 DIAGNOSIS — M1A00X0 Idiopathic chronic gout, unspecified site, without tophus (tophi): Secondary | ICD-10-CM

## 2019-05-10 NOTE — Progress Notes (Signed)
Subjective:    Patient ID: Thomas QuantLeroy A Icenhower Jr., male    DOB: 04-18-1981, 38 y.o.   MRN: [id number]  HPI  Patient is here for his annual exam.  He is also due for follow up of chronic conditions.  AFib: Managed on Diltiazem, Carvedilol, and Xarelto.  ECG from 01/2017 reviewed.  He follows Dr. Antoine PocheHochrein.   HTN: His BP today is 160/112.  He is taking Diltiazem, Carvedilol, Olmesartan as prescribed.  ECG from 01/2017 reviewed.  CKD III: His last creatinine was 1.67, GFR 60.12, 10/2017.  He is on Olmesartan for renal protection.  He does not follow with nephrology.  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  He is not on any cholesterol lowering medications at this time.  He is taking Diltiazem, Carvedilol, and Xarelto as prescribed.  He does not follow with neurology.  Gout: His last uric acid level was 7.7 on 01/2018.  He is taking Allopurinol daily and  Colchicine as needed for flares.   Flu: 09/2018 Tetanus: 2010 Dentist: as needed  Diet/exercise: He does eat lean meats. He consumes fruits and veggies daily. He tries to avoid fried foods. She drinks mostly water. Walking around the neighborhood.    Review of Systems      Past Medical History:  Diagnosis Date  . Atrial fibrillation (HCC) 10/29/2010  . Benign essential hypertension 12/15/2008  . Cardiomyopathy 11/28/2010  . History of chicken pox   . Renal insufficiency 11/28/2010    Current Outpatient Medications  Medication Sig Dispense Refill  . allopurinol (ZYLOPRIM) 100 MG tablet Take 1 tablet (100 mg total) by mouth daily. 30 tablet 6  . CARTIA XT 240 MG 24 hr capsule TAKE 1 CAPSULE BY MOUTH ONCE DAILY (MUST SCHEDULE ANNUAL EXAM) 90 capsule 0  . carvedilol (COREG) 12.5 MG tablet TAKE 1 TABLET BY MOUTH TWO TIMES DAILY WITTH A MEAL (NEEDS OV) 180 tablet 0  . colchicine 0.6 MG tablet Take 1 tab at onset of flare followed by 1/2 tab 1 hour later. Can take 1/2 tab BID prn thereafter during gout flare 30 tablet 2  .  olmesartan (BENICAR) 40 MG tablet Take 1 tablet (40 mg total) by mouth daily. 90 tablet 1  . XARELTO 20 MG TABS tablet TAKE 1 TABLET BY MOUTH ONCE DAILY WITH SUPPER 90 tablet 1   No current facility-administered medications for this visit.     No Known Allergies  Family History  Problem Relation Age of Onset  . Hypertension Mother   . Diabetes Mother   . Hypertension Father   . Glaucoma Father   . Hyperlipidemia Other        Parent    Social History   Socioeconomic History  . Marital status: Single    Spouse name: Not on file  . Number of children: Not on file  . Years of education: 2614  . Highest education level: Not on file  Occupational History  . Occupation: Geophysicist/field seismologistBehavioral Health    Employer: [employer]  Social Needs  . Financial resource strain: Not on file  . Food insecurity:    Worry: Not on file    Inability: Not on file  . Transportation needs:    Medical: Not on file    Non-medical: Not on file  Tobacco Use  . Smoking status: Never Smoker  . Smokeless tobacco: Never Used  Substance and Sexual Activity  . Alcohol use: Yes    Alcohol/week: 1.0 standard drinks  Types: 1 Shots of liquor per week    Comment: rare  . Drug use: No  . Sexual activity: Yes    Birth control/protection: Condom  Lifestyle  . Physical activity:    Days per week: Not on file    Minutes per session: Not on file  . Stress: Not on file  Relationships  . Social connections:    Talks on phone: Not on file    Gets together: Not on file    Attends religious service: Not on file    Active member of club or organization: Not on file    Attends meetings of clubs or organizations: Not on file    Relationship status: Not on file  . Intimate partner violence:    Fear of current or ex partner: Not on file    Emotionally abused: Not on file    Physically abused: Not on file    Forced sexual activity: Not on file  Other Topics Concern  . Not on file  Social History Narrative    Neither of his parents nor any siblings have any cardiac or heart rhythm issues. He has 2 grandparents that were on blood thinners but doesn't know why.      Regular exercise-no   Caffeine Use-yes     Constitutional: Denies fever, malaise, fatigue, headache or abrupt weight changes.  HEENT: Denies eye pain, eye redness, ear pain, ringing in the ears, wax buildup, runny nose, nasal congestion, bloody nose, or sore throat. Respiratory: Denies difficulty breathing, shortness of breath, cough or sputum production.   Cardiovascular: Denies chest pain, chest tightness, palpitations or swelling in the hands or feet.  Gastrointestinal: Denies abdominal pain, bloating, constipation, diarrhea or blood in the stool.  GU: Denies urgency, frequency, pain with urination, burning sensation, blood in urine, odor or discharge. Musculoskeletal: Pt reports persistent left sided weakness. Denies decrease in range of motion, difficulty with gait, muscle pain or joint pain and swelling.  Skin: Denies redness, rashes, lesions or ulcercations.  Neurological: Denies dizziness, difficulty with memory, difficulty with speech or problems with balance and coordination.  Psych: Denies anxiety, depression, SI/HI.  No other specific complaints in a complete review of systems (except as listed in HPI above).  Objective:   Physical Exam   BP (!) 162/110 (BP Location: Left Arm, Patient Position: Sitting, Cuff Size: Large)   Pulse 82   Temp 98.1 F (36.7 C) (Oral)   Ht 5' 11.75" (1.822 m)   Wt (!) 312 lb (141.5 kg)   SpO2 98%   BMI 42.61 kg/m  Wt Readings from Last 3 Encounters:  05/10/19 (!) 312 lb (141.5 kg)  11/16/18 (!) 339 lb (153.8 kg)  10/07/17 (!) 325 lb (147.4 kg)    General: Appears his stated age, obese, in NAD. Skin: Warm, dry and intact. No rashesnoted. HEENT: Head: normal shape and size; Eyes: sclera white, no icterus, conjunctiva pink, PERRLA and EOMs intact; Ears: Tm's gray and intact, normal  light reflex;  Neck:  Neck supple, trachea midline. No masses, lumps or thyromegaly present.  Cardiovascular: Normal rate and rhythm. S1,S2 noted.  No murmur, rubs or gallops noted. No JVD or BLE edema. Pulmonary/Chest: Normal effort and positive vesicular breath sounds. No respiratory distress. No wheezes, rales or ronchi noted.  Abdomen: Soft and nontender. Normal bowel sounds. No distention or masses noted. Liver, spleen and kidneys non palpable. Musculoskeletal: Strength 5/5 BUE/BLE. No difficulty with gait.  Neurological: Alert and oriented. Cranial nerves II-XII grossly intact. Coordination normal.  Psychiatric: Mood and affect normal. Behavior is normal. Judgment and thought content normal.    BMET    Component Value Date/Time   NA 142 10/07/2017 1615   K 4.0 10/07/2017 1615   CL 106 10/07/2017 1615   CO2 29 10/07/2017 1615   GLUCOSE 70 10/07/2017 1615   BUN 29 (H) 10/07/2017 1615   CREATININE 1.67 (H) 10/07/2017 1615   CREATININE 1.69 (H) 02/13/2017 1504   CALCIUM 9.6 10/07/2017 1615   GFRNONAA 34 (L) 12/18/2012 0625   GFRAA 39 (L) 12/18/2012 0625    Lipid Panel     Component Value Date/Time   CHOL 142 10/07/2017 1615   TRIG 118.0 10/07/2017 1615   HDL 43.40 10/07/2017 1615   CHOLHDL 3 10/07/2017 1615   VLDL 23.6 10/07/2017 1615   LDLCALC 75 10/07/2017 1615    CBC    Component Value Date/Time   WBC 4.3 10/07/2017 1615   RBC 5.12 10/07/2017 1615   HGB 15.1 10/07/2017 1615   HCT 46.6 10/07/2017 1615   PLT 147.0 (L) 10/07/2017 1615   MCV 91.0 10/07/2017 1615   MCH 30.0 02/13/2017 1504   MCHC 32.5 10/07/2017 1615   RDW 14.5 10/07/2017 1615   LYMPHSABS 1.8 12/21/2013 1621   MONOABS 0.3 12/21/2013 1621   EOSABS 0.1 12/21/2013 1621   BASOSABS 0.0 12/21/2013 1621    Hgb A1C Lab Results  Component Value Date   HGBA1C 5.8 05/21/2016           Assessment & Plan:   Preventative Health Maintenance:  Flu shot UTD He declines tetanus booster today  Encouraged him to consume a balanced diet and exercise regimen Advised him to see an eye doctor and dentist annually Will check CBC, CMET, Lipid, A1C and uric acid level today  RTC in 6 months to follow up chronic conditions Webb Silversmith, NP

## 2019-05-10 NOTE — Assessment & Plan Note (Signed)
Continue Olmesartan Will check CMET today

## 2019-05-10 NOTE — Patient Instructions (Signed)

## 2019-05-10 NOTE — Assessment & Plan Note (Signed)
Improved  Will monitor

## 2019-05-10 NOTE — Assessment & Plan Note (Addendum)
Will discuss with cardiology about changing Carvedilol to Labetalol for better BP control Continue Diltiazem and Omesartan CBC today

## 2019-05-10 NOTE — Assessment & Plan Note (Signed)
Uncontrolled Will discuss with cardiology about changing Carvedilol to Labetolol for better BP control Continue Diltiazem and Olmesartan Reinforced DASH diet and exercise for weight loss CMET today

## 2019-05-10 NOTE — Assessment & Plan Note (Signed)
Will check uric acid level today Continue Allopurinol as prescribed- will adjust if needed based on labs Continue Colchicine prn

## 2019-05-10 NOTE — Progress Notes (Signed)
Hi,  I am OK if you change to labetalol.  Maylon Cos

## 2019-05-11 LAB — CBC
HCT: 44.2 % (ref 39.0–52.0)
Hemoglobin: 14.4 g/dL (ref 13.0–17.0)
MCHC: 32.4 g/dL (ref 30.0–36.0)
MCV: 89.6 fl (ref 78.0–100.0)
Platelets: 151 10*3/uL (ref 150.0–400.0)
RBC: 4.94 Mil/uL (ref 4.22–5.81)
RDW: 13.8 % (ref 11.5–15.5)
WBC: 3.7 10*3/uL — ABNORMAL LOW (ref 4.0–10.5)

## 2019-05-11 LAB — COMPREHENSIVE METABOLIC PANEL
ALT: 19 U/L (ref 0–53)
AST: 25 U/L (ref 0–37)
Albumin: 4.4 g/dL (ref 3.5–5.2)
Alkaline Phosphatase: 49 U/L (ref 39–117)
BUN: 26 mg/dL — ABNORMAL HIGH (ref 6–23)
CO2: 28 mEq/L (ref 19–32)
Calcium: 9.8 mg/dL (ref 8.4–10.5)
Chloride: 102 mEq/L (ref 96–112)
Creatinine, Ser: 1.59 mg/dL — ABNORMAL HIGH (ref 0.40–1.50)
GFR: 59.34 mL/min — ABNORMAL LOW (ref >60.00)
Glucose, Bld: 84 mg/dL (ref 70–99)
Potassium: 4.4 mEq/L (ref 3.5–5.1)
Sodium: 139 mEq/L (ref 135–145)
Total Bilirubin: 0.5 mg/dL (ref 0.2–1.2)
Total Protein: 7.4 g/dL (ref 6.0–8.3)

## 2019-05-11 LAB — URIC ACID: Uric Acid, Serum: 5.9 mg/dL (ref 4.0–7.8)

## 2019-05-11 LAB — LIPID PANEL
Cholesterol: 128 mg/dL (ref 0–200)
HDL: 42 mg/dL (ref >39.00)
LDL Cholesterol: 71 mg/dL (ref 0–99)
NonHDL: 85.85
Total CHOL/HDL Ratio: 3
Triglycerides: 74 mg/dL (ref 0.0–149.0)
VLDL: 14.8 mg/dL (ref 0.0–40.0)

## 2019-05-11 LAB — HEMOGLOBIN A1C: Hgb A1c MFr Bld: 5.7 % (ref 4.6–6.5)

## 2019-05-11 MED ORDER — LABETALOL HCL 100 MG PO TABS: 100.0000 mg | ORAL_TABLET | Freq: Two times a day (BID) | ORAL | 0 refills | Status: DC

## 2019-05-11 MED FILL — LABETALOL HCL 100 MG TABLET: 100 | 30 days supply | Qty: 60 | Fill #0

## 2019-05-11 NOTE — Addendum Note (Signed)
Addended by: Jearld Fenton on: 05/11/2019 08:05 AM   Modules accepted: Orders

## 2019-05-11 NOTE — Progress Notes (Signed)
Call patient.  Spoke with Dr. Percival Spanish.  He is ok with [place] changing carvedilol to labetolol.  Will send in labetolol 100 mg BID in place of carvedilol.  I want to see him in 2 weeks for BP f/u.  Doxy.me is fine.

## 2019-05-13 ENCOUNTER — Ambulatory Visit (INDEPENDENT_AMBULATORY_CARE_PROVIDER_SITE_OTHER): Payer: No Typology Code available for payment source | Admitting: Internal Medicine

## 2019-05-13 ENCOUNTER — Encounter: Payer: Self-pay | Admitting: Internal Medicine

## 2019-05-13 DIAGNOSIS — Z789 Other specified health status: Secondary | ICD-10-CM

## 2019-05-13 NOTE — Patient Instructions (Signed)
Male Infertility    Male infertility refers to a man's inability to get a woman pregnant (get her to conceive) after a year of having sex regularly without using birth control.  Both men and women can have fertility problems.  What are the causes?  This condition may be caused by:   Problems with sperm. Infertility can result if a man is:  ? Not producing enough sperm (low sperm count).  ? Not producing enough sperm of normal size and shape (poor sperm morphology).  ? Producing sperm that are not able to reach the egg (poor motility).   Problems in a man's reproductive organs, such as:  ? Enlarged veins (varicoceles), cysts (spermatoceles), or tumors of the testicles.  ? Sexual dysfunction, including inability to have an erection.  ? Injury to the testicles.  ? Having had a testicle that did not drop to its location in the scrotum (undescended testicle).  ? A birth defect, such as not having the tubes that carry sperm (vas deferens).  ? Lack of certain hormones.   Certain medical conditions. These may include:  ? Diabetes.  ? Cancer and cancer treatments, such as chemotherapy or radiation.  ? Klinefelter syndrome. This is an inherited genetic disorder.  ? Thyroid problems, such as an underactive or overactive thyroid.  ? Cystic fibrosis.  ? Infections.  ? Sexually transmitted diseases.  Infertility can be linked to more than one cause. The cause of infertility in some men is not known (unexplained infertility).  What increases the risk?   Age. A man's fertility declines with age.   Smoking tobacco.   Excessive alcohol use.   Obesity.   Emotional stress.   Exposure of heat to the testicles, such as frequent use of a hot tub or sauna.   Using drugs such as anabolic steroids, cocaine, and marijuana.   Being exposed to environmental toxins, such as pesticides and lead.  What are the signs or symptoms?  The main sign of infertility in men is the inability to get a woman to conceive.  How is this  diagnosed?  This condition may be diagnosed using:   Semen tests to check sperm count, morphology, and motility.   Blood tests to check hormone levels.   Ultrasound of the scrotum to check for a varicocele or problems with the testicles.   Transrectal ultrasound to check the prostate gland and to look for problems with the tubes that transport the semen (seminal vesicles).   Taking a small sample of tissue from inside a testicle (biopsy). This is examined under a microscope.   Blood tests to check for genetic abnormalities (genetic testing).  To be diagnosed with infertility, both partners will have a physical exam. Both partners will also have an extensive medical and sexual history taken. Additional tests may be done.  How is this treated?  Treatment depends on the cause of infertility. Most cases of infertility in men are treated with medicine, surgery, or lifestyle changes.   Men may take medicine to:  ? Correct hormone problems.  ? Treat other health conditions.  ? Treat infections.  ? Treat sexual dysfunction.   Surgery may be done to:  ? Remove blockages in the reproductive tract.  ? Correct other structural problems of the reproductive tract.   Lifestyle changes, such as:  ? Reducing or not drinking alcohol.  ? Stopping use of drugs such as anabolic steroids, cocaine, and marijuana.  ? Losing weight.  ? Stopping smoking.  ? Using stress   reduction techniques.  Assisted reproductive technology (ART)  Assisted reproductive technology (ART) refers to all treatments and procedures that combine eggs and sperm outside the body to try to help a couple conceive. Sperm is collected through normal ejaculation or surgery, or from a donor. ART is often combined with fertility drugs to stimulate ovulation in the woman. Sometimes ART is done using eggs retrieved from another woman's body (donor eggs) or from previously frozen fertilized eggs (embryos).  There are different types of ART. These  include:   Intrauterine insemination (IUI). A long, thin tube is used to place sperm directly into a woman's uterus. This procedure:  ? Is effective for infertility caused by sperm problems, including low sperm count and low motility.  ? Can be used in combination with fertility drugs.   In vitro fertilization (IVF). This is done when a woman's fallopian tubes are blocked or when a man has low sperm count. In this procedure:  ? Fertility drugs are used to stimulate the ovaries to produce multiple eggs.  ? Once mature, these eggs are removed from the body and combined with the sperm to be fertilized.  ? The fertilized eggs are then placed into the woman's uterus.  Follow these instructions at home:   Take over-the-counter and prescription medicines only as told by your health care provider.   Do not use any products that contain nicotine or tobacco, such as cigarettes and e-cigarettes. If you need help quitting, ask your health care provider.   If you drink alcohol, limit how much you have to 0-2 drinks a day.   Make dietary changes to lose weight or maintain a healthy weight. Work with your health care provider and a dietitian to set a weight-loss goal that is healthy and reasonable for you.   Seek support from a counselor or support group to talk about your concerns related to infertility. Couples counseling may be helpful for you and your partner.   Practice stress reduction techniques that work well for you, such as regular physical activity, meditation, or deep breathing.   Keep all follow-up visits as told by your health care provider. This is important.  Contact a health care provider if you:   Feel that stress is interfering with your life and relationships.   Have side effects from treatments for infertility.  Summary   Male infertility refers to a man's inability to get a woman pregnant (get her to conceive) after a year of having sex regularly without using birth control.   To be diagnosed  with infertility, both partners will have a physical exam. Both partners will also have an extensive medical and sexual history taken.   Seek support from a counselor or support group to talk about your concerns related to infertility. Couples counseling may be helpful for you and your partner.  This information is not intended to replace advice given to you by your health care provider. Make sure you discuss any questions you have with your health care provider.  Document Released: 10/20/2017 Document Revised: 10/20/2017 Document Reviewed: 10/20/2017  Elsevier Interactive Patient Education  2019 [employer]

## 2019-05-13 NOTE — Progress Notes (Signed)
Virtual Visit via Video Note  I connected with Thomas [name]. on 05/13/19 at  3:45 PM EDT by a video enabled telemedicine application and verified that I am speaking with the correct person using two identifiers.  Location: Patient: Work Provider: Office   I discussed the limitations of evaluation and management by telemedicine and the availability of in person appointments. The patient expressed understanding and agreed to proceed.  History of Present Illness:  Pt wanting to discuss infertility. He is not sure if he has any issues. Him and his partner have been trying to conceive for 4 months without success. His partner has an appt with her GYN for to discuss difficulty conceiving and he would like referral for further evaluation as well.    Past Medical History:  Diagnosis Date  . Atrial fibrillation ([place]) 10/29/2010  . Benign essential hypertension 12/15/2008  . Cardiomyopathy 11/28/2010  . History of chicken pox   . Renal insufficiency 11/28/2010    Current Outpatient Medications  Medication Sig Dispense Refill  . allopurinol (ZYLOPRIM) 100 MG tablet Take 1 tablet (100 mg total) by mouth daily. 30 tablet 6  . CARTIA XT 240 MG 24 hr capsule TAKE 1 CAPSULE BY MOUTH ONCE DAILY (MUST SCHEDULE ANNUAL EXAM) 90 capsule 0  . colchicine 0.6 MG tablet Take 1 tab at onset of flare followed by 1/2 tab 1 hour later. Can take 1/2 tab BID prn thereafter during gout flare 30 tablet 2  . labetalol (NORMODYNE) 100 MG tablet Take 1 tablet (100 mg total) by mouth 2 (two) times daily. 60 tablet 0  . olmesartan (BENICAR) 40 MG tablet Take 1 tablet (40 mg total) by mouth daily. 90 tablet 1  . XARELTO 20 MG TABS tablet TAKE 1 TABLET BY MOUTH ONCE DAILY WITH SUPPER 90 tablet 1   No current facility-administered medications for this visit.     No Known Allergies  Family History  Problem Relation Age of Onset  . Hypertension Mother   . Diabetes Mother   . Hypertension Father   . Glaucoma  Father   . Hyperlipidemia Other        Parent    Social History   Socioeconomic History  . Marital status: Single    Spouse name: Not on file  . Number of children: Not on file  . Years of education: 35  . Highest education level: Not on file  Occupational History  . Occupation: Economist: [employer]  Social Needs  . Financial resource strain: Not on file  . Food insecurity    Worry: Not on file    Inability: Not on file  . Transportation needs    Medical: Not on file    Non-medical: Not on file  Tobacco Use  . Smoking status: Never Smoker  . Smokeless tobacco: Never Used  Substance and Sexual Activity  . Alcohol use: Yes    Alcohol/week: 1.0 standard drinks    Types: 1 Shots of liquor per week    Comment: rare  . Drug use: No  . Sexual activity: Yes    Birth control/protection: Condom  Lifestyle  . Physical activity    Days per week: Not on file    Minutes per session: Not on file  . Stress: Not on file  Relationships  . Social Herbalist on phone: Not on file    Gets together: Not on file    Attends religious service: Not  on file    Active member of club or organization: Not on file    Attends meetings of clubs or organizations: Not on file    Relationship status: Not on file  . Intimate partner violence    Fear of current or ex partner: Not on file    Emotionally abused: Not on file    Physically abused: Not on file    Forced sexual activity: Not on file  Other Topics Concern  . Not on file  Social History Narrative   Neither of his parents nor any siblings have any cardiac or heart rhythm issues. He has 2 grandparents that were on blood thinners but doesn't know why.      Regular exercise-no   Caffeine Use-yes     Constitutional: Denies fever, malaise, fatigue, headache or abrupt weight changes.  Respiratory: Denies difficulty breathing, shortness of breath, cough or sputum production.   Cardiovascular:  Denies chest pain, chest tightness, palpitations or swelling in the hands or feet.  Gastrointestinal: Denies abdominal pain, bloating, constipation, diarrhea or blood in the stool.  GU: Pt reports difficulty conceiving. Denies urgency, frequency, pain with urination, burning sensation, blood in urine, odor or discharge.  No other specific complaints in a complete review of systems (except as listed in HPI above).   Wt Readings from Last 3 Encounters:  05/10/19 (!) 312 lb (141.5 kg)  11/16/18 (!) 339 lb (153.8 kg)  10/07/17 (!) 325 lb (147.4 kg)    General: Appears his stated age, obese in NAD. Pulmonary/Chest: Normal effort. No respiratory distress.  Neurological: Alert and oriented. Psychiatric: Mood and affect normal. Behavior is normal. Judgment and thought content normal.     BMET    Component Value Date/Time   NA 139 05/10/2019 1522   K 4.4 05/10/2019 1522   CL 102 05/10/2019 1522   CO2 28 05/10/2019 1522   GLUCOSE 84 05/10/2019 1522   BUN 26 (H) 05/10/2019 1522   CREATININE 1.59 (H) 05/10/2019 1522   CREATININE 1.69 (H) 02/13/2017 1504   CALCIUM 9.8 05/10/2019 1522   GFRNONAA 34 (L) 12/18/2012 0625   GFRAA 39 (L) 12/18/2012 0625    Lipid Panel     Component Value Date/Time   CHOL 128 05/10/2019 1522   TRIG 74.0 05/10/2019 1522   HDL 42.00 05/10/2019 1522   CHOLHDL 3 05/10/2019 1522   VLDL 14.8 05/10/2019 1522   LDLCALC 71 05/10/2019 1522    CBC    Component Value Date/Time   WBC 3.7 (L) 05/10/2019 1522   RBC 4.94 05/10/2019 1522   HGB 14.4 05/10/2019 1522   HCT 44.2 05/10/2019 1522   PLT 151.0 05/10/2019 1522   MCV 89.6 05/10/2019 1522   MCH 30.0 02/13/2017 1504   MCHC 32.4 05/10/2019 1522   RDW 13.8 05/10/2019 1522   LYMPHSABS 1.8 12/21/2013 1621   MONOABS 0.3 12/21/2013 1621   EOSABS 0.1 12/21/2013 1621   BASOSABS 0.0 12/21/2013 1621    Hgb A1C Lab Results  Component Value Date   HGBA1C 5.7 05/10/2019         Assessment and  Plan:  Difficulty Conceiving:  Advised him typically we don't start workup until after 1 year of trying to conceive without success He would still like to see a urologist anyway- referral placed  Return precautions discussed  Follow Up Instructions:    I discussed the assessment and treatment plan with the patient. The patient was provided an opportunity to ask questions and all were answered. The  patient agreed with the plan and demonstrated an understanding of the instructions.   The patient was advised to call back or seek an in-person evaluation if the symptoms worsen or if the condition fails to improve as anticipated.    Nicki Reaper, NP

## 2019-06-01 MED FILL — ALLOPURINOL 100 MG TABS: 100 | 30 days supply | Qty: 30 | Fill #2

## 2019-06-01 MED FILL — COLCHICINE 0.6 MG TABS: 0.6 | 30 days supply | Qty: 30 | Fill #2

## 2019-06-01 MED FILL — OLMESARTAN MEDOXOMIL 40 MG: 40 | 30 days supply | Qty: 30 | Fill #2

## 2019-06-07 ENCOUNTER — Other Ambulatory Visit: Payer: Self-pay | Admitting: Internal Medicine

## 2019-06-07 DIAGNOSIS — I482 Chronic atrial fibrillation, unspecified: Secondary | ICD-10-CM

## 2019-06-07 MED FILL — CARTIA XT 240 MG CAPSULE SA: 240 | 90 days supply | Qty: 90 | Fill #0

## 2019-06-09 ENCOUNTER — Other Ambulatory Visit: Payer: Self-pay

## 2019-06-09 DIAGNOSIS — I482 Chronic atrial fibrillation, unspecified: Secondary | ICD-10-CM

## 2019-06-09 MED ORDER — DILTIAZEM HCL ER COATED BEADS 240 MG PO CP24: 240.0000 mg | ORAL_CAPSULE | Freq: Every day | ORAL | 1 refills | Status: DC

## 2019-06-21 ENCOUNTER — Other Ambulatory Visit: Payer: Self-pay | Admitting: Internal Medicine

## 2019-06-21 MED FILL — LABETALOL HCL 100MG TABLET: 100 | 30 days supply | Qty: 60 | Fill #0

## 2019-06-21 NOTE — Telephone Encounter (Signed)
I was trying to find a note, last filled 05/11/2019, is this a new medication pt needs to continue?.... I assume there may have been a change from the Carvedilol, but did not see a note...Marland Kitchen please advise

## 2019-07-01 ENCOUNTER — Other Ambulatory Visit: Payer: Self-pay | Admitting: Internal Medicine

## 2019-07-01 MED FILL — COLCHICINE 0.6 MG TABS: 0.6 | 15 days supply | Qty: 30 | Fill #0

## 2019-07-01 MED FILL — OLMESARTAN MEDOXOMIL 40 MG: 40 | 30 days supply | Qty: 30 | Fill #3

## 2019-07-01 MED FILL — ALLOPURINOL 100 MG TABS: 100 | 30 days supply | Qty: 30 | Fill #3

## 2019-07-26 NOTE — Progress Notes (Signed)
Cardiology Office Note   Date:  07/27/2019   ID:  [name]., DOB 21-Oct-1981, MRN [id number]  PCP:  Jearld Fenton, NP  Cardiologist:   Minus Breeding, MD  Referring:  Jearld Fenton, NP  Chief Complaint  Patient presents with  . Atrial Fibrillation      History of Present Illness: Thomas [name]. is a 38 y.o. male who presents for follow up of atrial fib, HTN and a CVA. He has been treated with TPA in past for his CVA.  I last saw him in 2018.  He works at [employer].  He was exercising before the virus happened.  He has not been doing that as much though he does his activities of daily living.The patient denies any new symptoms such as chest discomfort, neck or arm discomfort. There has been no new shortness of breath, PND or orthopnea. There have been no reported palpitations, presyncope or syncope.    Past Medical History:  Diagnosis Date  . Atrial fibrillation ([place]) 10/29/2010  . Benign essential hypertension 12/15/2008  . Cardiomyopathy 11/28/2010  . History of chicken pox   . Renal insufficiency 11/28/2010    History reviewed. No pertinent surgical history.   Current Outpatient Medications  Medication Sig Dispense Refill  . allopurinol (ZYLOPRIM) 100 MG tablet Take 1 tablet (100 mg total) by mouth daily. 30 tablet 6  . colchicine 0.6 MG tablet TAKE 1 TAB AT ONSET OF FLARE FOLLOWED BY 1/2 TAB 1 HOUR LATER. CAN TAKE 1/2 TAB 2 X A DAY AS NEEDED THEREAFTER DURING GOUT FLARE 30 tablet 2  . diltiazem (CARTIA XT) 240 MG 24 hr capsule Take 1 capsule (240 mg total) by mouth daily. 90 capsule 1  . labetalol (NORMODYNE) 100 MG tablet TAKE 1 TABLET (100 MG TOTAL) BY MOUTH 2 (TWO) TIMES DAILY. 60 tablet 2  . olmesartan (BENICAR) 40 MG tablet Take 1 tablet (40 mg total) by mouth daily. 90 tablet 1  . XARELTO 20 MG TABS tablet TAKE 1 TABLET BY MOUTH ONCE DAILY WITH SUPPER 90 tablet 1   No current facility-administered medications for this visit.      Allergies:   Patient has no known allergies.    ROS:  Please see the history of present illness.   Otherwise, review of systems are positive for none.   All other systems are reviewed and negative.    PHYSICAL EXAM: VS:  BP (!) 146/84   Pulse 69   Ht 5' 11.75" (1.822 m)   Wt (!) 320 lb 9.6 oz (145.4 kg)   SpO2 98%   BMI 43.78 kg/m  , BMI Body mass index is 43.78 kg/m. GENERAL:  Well appearing NECK:  No jugular venous distention, waveform within normal limits, carotid upstroke brisk and symmetric, no bruits, no thyromegaly LUNGS:  Clear to auscultation bilaterally CHEST:  Unremarkable HEART:  PMI not displaced or sustained,S1 and S2 within normal limits, no S3, no clicks, no rubs, no murmurs, irregular ABD:  Flat, positive bowel sounds normal in frequency in pitch, no bruits, no rebound, no guarding, no midline pulsatile mass, no hepatomegaly, no splenomegaly EXT:  2 plus pulses throughout, no edema, no cyanosis no clubbing   EKG:  EKG is  ordered today. The ekg ordered today demonstrates atrial fib with rate 60, axis WNL, intervals WNL, non specific ST T wave changes.   No change from previous.    Recent Labs: 05/10/2019: ALT 19; BUN 26; Creatinine, Ser 1.59;  Hemoglobin 14.4; Platelets 151.0; Potassium 4.4; Sodium 139    Lipid Panel    Component Value Date/Time   CHOL 128 05/10/2019 1522   TRIG 74.0 05/10/2019 1522   HDL 42.00 05/10/2019 1522   CHOLHDL 3 05/10/2019 1522   VLDL 14.8 05/10/2019 1522   LDLCALC 71 05/10/2019 1522      Wt Readings from Last 3 Encounters:  07/27/19 (!) 320 lb 9.6 oz (145.4 kg)  05/10/19 (!) 312 lb (141.5 kg)  11/16/18 (!) 339 lb (153.8 kg)      Other studies Reviewed: Additional studies/ records that were reviewed today include: Labs Review of the above records demonstrates:  See elsewhere   ASSESSMENT AND PLAN:  ATRIAL FIB:  Mr. [name]. has a [name]2DS2 - VASc score of 3.    He tolerates this.  He tolerates  anticoagulation is up-to-date with blood work including CBC and be met.  No change in therapy.   CVA:  He has very mild residual from this.  He has left arm and hand weakness.  No change in therapy.   NON ISCHEMIC CARDIOMYOPATHY:  EF was 50 - 55%.  He had some mild LVH.  He has no symptoms since his last echocardiogram in 2018.  No change in therapy.   HTN:    The blood pressure is much better than it used to be.  I have suggested weight loss to get him to his target.  OBESITY: We talked about continued strategies for weight loss.  Current medicines are reviewed at length with the patient today.  The patient does not have concerns regarding medicines.  The following changes have been made:   None  Labs/ tests ordered today include: None  No orders of the defined types were placed in this encounter.    Disposition:   FU with me in 2 years.     Signed, Minus Breeding, MD  07/27/2019 4:56 PM    [facility]

## 2019-07-27 ENCOUNTER — Ambulatory Visit (INDEPENDENT_AMBULATORY_CARE_PROVIDER_SITE_OTHER): Payer: No Typology Code available for payment source | Admitting: Cardiology

## 2019-07-27 ENCOUNTER — Encounter: Payer: Self-pay | Admitting: Cardiology

## 2019-07-27 ENCOUNTER — Other Ambulatory Visit: Payer: Self-pay

## 2019-07-27 VITALS — BP 146/84 | HR 69 | Ht 71.75 in | Wt 320.6 lb

## 2019-07-27 DIAGNOSIS — I10 Essential (primary) hypertension: Secondary | ICD-10-CM | POA: Diagnosis not present

## 2019-07-27 DIAGNOSIS — I4891 Unspecified atrial fibrillation: Secondary | ICD-10-CM | POA: Diagnosis not present

## 2019-07-27 NOTE — Patient Instructions (Signed)
Medication Instructions:  Your physician recommends that you continue on your current medications as directed. Please refer to the Current Medication list given to you today.  If you need a refill on your cardiac medications before your next appointment, please call your pharmacy.   Lab work: none  Testing/Procedures: none  Follow-Up: At [facility], you and your health needs are our priority.  As part of our continuing mission to provide you with exceptional heart care, we have created designated Provider Care Teams.  These Care Teams include your primary Cardiologist (physician) and Advanced Practice Providers (APPs -  Physician Assistants and Nurse Practitioners) who all work together to provide you with the care you need, when you need it. You will need a follow up appointment in 12 months.  Please call our office 2 months in advance to schedule this appointment.  You may see Dr Percival Spanish  or one of the following Advanced Practice Providers on your designated Care Team:   Rosaria Ferries, PA-C . Jory Sims, DNP, ANP

## 2019-07-30 ENCOUNTER — Other Ambulatory Visit (INDEPENDENT_AMBULATORY_CARE_PROVIDER_SITE_OTHER): Payer: No Typology Code available for payment source

## 2019-07-30 DIAGNOSIS — I4891 Unspecified atrial fibrillation: Secondary | ICD-10-CM

## 2019-07-30 DIAGNOSIS — I10 Essential (primary) hypertension: Secondary | ICD-10-CM | POA: Diagnosis not present

## 2019-08-02 MED FILL — OLMESARTAN MEDOXOMIL 40 MG: 40 | 30 days supply | Qty: 30 | Fill #4

## 2019-08-02 MED FILL — ALLOPURINOL 100 MG TABS: 100 | 30 days supply | Qty: 30 | Fill #4

## 2019-08-02 MED FILL — COLCHICINE 0.6 MG TABS: 0.6 | 15 days supply | Qty: 30 | Fill #1

## 2019-08-02 MED FILL — LABETALOL HCL 100MG TABLET: 100 | 30 days supply | Qty: 60 | Fill #1

## 2019-09-01 MED FILL — OLMESARTAN MEDOXOMIL 40 MG: 40 | 30 days supply | Qty: 30 | Fill #5

## 2019-09-01 MED FILL — CARTIA XT 240 MG CAPSULE SA: 240 | 90 days supply | Qty: 90 | Fill #1

## 2019-09-01 MED FILL — ALLOPURINOL 100 MG TABS: 100 | 30 days supply | Qty: 30 | Fill #5

## 2019-09-01 MED FILL — LABETALOL HCL 100MG TABLET: 100 | 30 days supply | Qty: 60 | Fill #2

## 2019-09-01 MED FILL — COLCHICINE 0.6 MG TABS: 0.6 | 15 days supply | Qty: 30 | Fill #2

## 2019-09-30 ENCOUNTER — Other Ambulatory Visit: Payer: Self-pay | Admitting: Internal Medicine

## 2019-09-30 DIAGNOSIS — I482 Chronic atrial fibrillation, unspecified: Secondary | ICD-10-CM

## 2019-09-30 MED FILL — ALLOPURINOL 100 MG TABS: 100 | 30 days supply | Qty: 30 | Fill #6

## 2019-09-30 NOTE — Telephone Encounter (Signed)
Patient stated that at his last visit it was discussed that his prescriptions should be for 90 days.  Patient called to make sure they were sent for the 90 and not 30

## 2019-10-01 MED FILL — COLCHICINE 0.6 MG TABS: 0.6 | 45 days supply | Qty: 90 | Fill #0

## 2019-10-01 MED FILL — XARELTO 20 MG TABLET: 20 | 90 days supply | Qty: 90 | Fill #0

## 2019-10-01 MED FILL — OLMESARTAN MEDOXOMIL 40 MG: 40 | 30 days supply | Qty: 30 | Fill #0

## 2019-10-21 ENCOUNTER — Other Ambulatory Visit: Payer: Self-pay | Admitting: Internal Medicine

## 2019-10-22 MED FILL — LABETALOL HCL 100MG TABLET: 100 | 90 days supply | Qty: 180 | Fill #0

## 2019-10-24 MED FILL — ALLOPURINOL 100 MG TABS: 100 | 90 days supply | Qty: 90 | Fill #0

## 2019-10-25 MED FILL — OLMESARTAN MEDOXOMIL 40 MG: 40 | 30 days supply | Qty: 30 | Fill #1

## 2019-12-06 MED FILL — CARTIA XT 240 MG CAPSULE SA: 240 | 90 days supply | Qty: 90 | Fill #0

## 2020-01-04 MED FILL — OLMESARTAN MEDOXOMIL 40 MG: 40 | 30 days supply | Qty: 30 | Fill #3

## 2020-01-04 MED FILL — COLCHICINE 0.6 MG TABS: 0.6 | 45 days supply | Qty: 90 | Fill #1

## 2020-01-04 MED FILL — XARELTO 20 MG TABLET: 20 | 90 days supply | Qty: 90 | Fill #1

## 2020-02-05 MED FILL — ALLOPURINOL 100 MG TABS: 100 | 90 days supply | Qty: 90 | Fill #1

## 2020-02-05 MED FILL — OLMESARTAN MEDOXOMIL 40 MG: 40 | 30 days supply | Qty: 30 | Fill #4

## 2020-02-29 ENCOUNTER — Other Ambulatory Visit: Payer: No Typology Code available for payment source | Admitting: Orthotics

## 2020-02-29 ENCOUNTER — Other Ambulatory Visit: Payer: Self-pay

## 2020-02-29 ENCOUNTER — Ambulatory Visit (INDEPENDENT_AMBULATORY_CARE_PROVIDER_SITE_OTHER): Payer: No Typology Code available for payment source | Admitting: Podiatry

## 2020-02-29 DIAGNOSIS — M2580 Other specified joint disorders, unspecified joint: Secondary | ICD-10-CM

## 2020-02-29 DIAGNOSIS — M79671 Pain in right foot: Secondary | ICD-10-CM

## 2020-02-29 DIAGNOSIS — M19072 Primary osteoarthritis, left ankle and foot: Secondary | ICD-10-CM | POA: Diagnosis not present

## 2020-02-29 DIAGNOSIS — M19071 Primary osteoarthritis, right ankle and foot: Secondary | ICD-10-CM | POA: Diagnosis not present

## 2020-02-29 DIAGNOSIS — M19079 Primary osteoarthritis, unspecified ankle and foot: Secondary | ICD-10-CM

## 2020-02-29 DIAGNOSIS — M205X1 Other deformities of toe(s) (acquired), right foot: Secondary | ICD-10-CM

## 2020-02-29 NOTE — Patient Instructions (Signed)

## 2020-03-02 ENCOUNTER — Other Ambulatory Visit: Payer: No Typology Code available for payment source | Admitting: Orthotics

## 2020-03-06 NOTE — Progress Notes (Signed)
Subjective:   Patient ID: Thomas Quant., male   DOB: 39 y.o.   MRN: [id number]   HPI 39 year old male presents the office of requesting new orthotics.  He states they were uncomfortable with putting pressure on the right foot submetatarsal 1.  He is interested in new orthotics but he wants to change the material.  No recent injury.  No swelling.  No rating pain or weakness.   Review of Systems  All other systems reviewed and are negative.  Past Medical History:  Diagnosis Date  . Atrial fibrillation ([facility]) 10/29/2010  . Benign essential hypertension 12/15/2008  . Cardiomyopathy 11/28/2010  . History of chicken pox   . Renal insufficiency 11/28/2010    No past surgical history on file.   Current Outpatient Medications:  .  allopurinol (ZYLOPRIM) 100 MG tablet, TAKE 1 TABLET BY MOUTH DAILY., Disp: 90 tablet, Rfl: 1 .  colchicine 0.6 MG tablet, TAKE 1 TAB AT ONSET OF FLARE FOLLOWED BY 1/2 TAB 1 HOUR LATER. CAN TAKE 1/2 TAB 2 TIMES A DAY AS NEEDED THEREAFTER DURING GOUT FLARE, Disp: 90 tablet, Rfl: 1 .  diltiazem (CARTIA XT) 240 MG 24 hr capsule, Take 1 capsule (240 mg total) by mouth daily., Disp: 90 capsule, Rfl: 1 .  labetalol (NORMODYNE) 100 MG tablet, TAKE 1 TABLET BY MOUTH TWICE DAILY, Disp: 180 tablet, Rfl: 1 .  olmesartan (BENICAR) 40 MG tablet, TAKE 1 TABLET BY MOUTH DAILY., Disp: 90 tablet, Rfl: 1 .  XARELTO 20 MG TABS tablet, TAKE 1 TABLET BY MOUTH ONCE DAILY WITH SUPPER, Disp: 90 tablet, Rfl: 1  No Known Allergies       Objective:  Physical Exam  General: AAO x3, NAD  Dermatological: Skin is warm, dry and supple bilateral. Nails x 10 are well manicured; remaining integument appears unremarkable at this time. There are no open sores, no preulcerative lesions, no rash or signs of infection present.  Vascular: Dorsalis Pedis artery and Posterior Tibial artery pedal pulses are 2/4 bilateral with immedate capillary fill time. Pedal hair growth present. No varicosities  and no lower extremity edema present bilateral. There is no pain with calf compression, swelling, warmth, erythema.   Neruologic: Grossly intact via light touch bilateral. Vibratory intact via tuning fork bilateral. Protective threshold with Semmes Wienstein monofilament intact to all pedal sites bilateral. Patellar and Achilles deep tendon reflexes 2+ bilateral. No Babinski or clonus noted bilateral.   Musculoskeletal: Prominence of the sesamoids on the right foot worse than left and this is where there is tenderness palpation submetatarsal 1 on the sesamoid complex.  There is no edema.  No pain with MPJ range of motion or other areas of discomfort.  Muscular strength 5/5 in all groups tested bilateral.  Equinus present  Gait: Unassisted, Nonantalgic.       Assessment:   Sesamoiditis    Plan:  -Treatment options discussed including all alternatives, risks, and complications -Etiology of symptoms were discussed -He was measured today for new orthotics.  Order to offload submetatarsal 1, sesamoids which we marked.  Also well change the material so you are not doing leather on the top-cover.  Discussed stretching exercises to help stretch the Achilles tendon.  Return in about 3 weeks (around 03/21/2020) for pick up orthtoics .  Vivi Barrack DPM

## 2020-03-13 MED FILL — OLMESARTAN MEDOXOMIL 40 MG: 40 | 30 days supply | Qty: 30 | Fill #5

## 2020-03-17 MED FILL — LABETALOL HCL 100MG TABLET: 100 | 90 days supply | Qty: 180 | Fill #1

## 2020-03-17 MED FILL — CARTIA XT 240 MG CAPSULE SA: 240 | 90 days supply | Qty: 90 | Fill #1

## 2020-03-21 ENCOUNTER — Other Ambulatory Visit: Payer: Self-pay

## 2020-03-21 ENCOUNTER — Ambulatory Visit: Payer: No Typology Code available for payment source | Admitting: Orthotics

## 2020-03-21 DIAGNOSIS — M205X1 Other deformities of toe(s) (acquired), right foot: Secondary | ICD-10-CM

## 2020-03-21 NOTE — Progress Notes (Signed)
Patient came in today to pick up custom made foot orthotics.  The goals were accomplished and the patient reported no dissatisfaction with said orthotics.  Patient was advised of breakin period and how to report any issues. 

## 2020-04-18 ENCOUNTER — Other Ambulatory Visit: Payer: Self-pay | Admitting: Internal Medicine

## 2020-04-19 MED FILL — OLMESARTAN MEDOXOMIL 40 MG: 40 | 30 days supply | Qty: 30 | Fill #0

## 2020-04-19 MED FILL — COLCHICINE 0.6 MG TABS: 0.6 | 90 days supply | Qty: 90 | Fill #0

## 2020-05-13 ENCOUNTER — Other Ambulatory Visit: Payer: Self-pay | Admitting: Internal Medicine

## 2020-05-15 MED FILL — OLMESARTAN MEDOXOMIL 40 MG: 40 | 30 days supply | Qty: 30 | Fill #0

## 2020-05-15 MED FILL — ALLOPURINOL 100 MG TABS: 100 | 30 days supply | Qty: 30 | Fill #0

## 2020-06-14 ENCOUNTER — Telehealth: Payer: Self-pay | Admitting: Cardiology

## 2020-06-14 NOTE — Telephone Encounter (Signed)
LVM for patient to return call to get f/u scheduled with Hochrein from recall list 

## 2020-06-15 ENCOUNTER — Other Ambulatory Visit: Payer: Self-pay | Admitting: Internal Medicine

## 2020-06-15 DIAGNOSIS — I482 Chronic atrial fibrillation, unspecified: Secondary | ICD-10-CM

## 2020-06-15 MED FILL — ALLOPURINOL 100 MG TABS: 100 | 30 days supply | Qty: 30 | Fill #0

## 2020-06-15 MED FILL — OLMESARTAN MEDOXOMIL 40 MG: 40 | 30 days supply | Qty: 30 | Fill #0

## 2020-06-15 MED FILL — DILTIAZEM 24HR ER 240 MG CA: 240 | 30 days supply | Qty: 30 | Fill #0

## 2020-07-17 ENCOUNTER — Other Ambulatory Visit: Payer: Self-pay | Admitting: Internal Medicine

## 2020-07-17 DIAGNOSIS — I482 Chronic atrial fibrillation, unspecified: Secondary | ICD-10-CM

## 2020-07-19 MED ORDER — OLMESARTAN MEDOXOMIL 40 MG PO TABS: 40.0000 mg | ORAL_TABLET | Freq: Every day | ORAL | 0 refills | Status: DC

## 2020-07-19 MED ORDER — LABETALOL HCL 100 MG PO TABS: 100.0000 mg | ORAL_TABLET | Freq: Two times a day (BID) | ORAL | 0 refills | Status: DC

## 2020-07-19 MED FILL — ALLOPURINOL 100 MG TABS: 100 | 90 days supply | Qty: 90 | Fill #0

## 2020-07-19 MED FILL — DILTIAZEM 24HR ER 240 MG CA: 240 | 90 days supply | Qty: 90 | Fill #0

## 2020-07-19 MED FILL — COLCHICINE 0.6 MG TABS: 0.6 | 90 days supply | Qty: 90 | Fill #0

## 2020-07-19 MED FILL — LABETALOL HCL 100 MG TABLET: 100 | 90 days supply | Qty: 180 | Fill #0

## 2020-07-19 MED FILL — OLMESARTAN MEDOXOMIL 40 MG: 40 | 90 days supply | Qty: 90 | Fill #0

## 2020-07-24 DIAGNOSIS — Z7189 Other specified counseling: Secondary | ICD-10-CM | POA: Insufficient documentation

## 2020-07-24 NOTE — Progress Notes (Signed)
Cardiology Office Note   Date:  07/25/2020   ID:  [name], DOB [birth year], MRN [id number]  PCP:  Lorre Munroe, NP  Cardiologist:   Rollene Rotunda, MD  Referring:  Lorre Munroe, NP  Chief Complaint  Patient presents with  . Atrial Fibrillation      History of Present Illness: Thomas [name]. is a 39 y.o. male who presents for follow up of atrial fib, HTN and a CVA. He has been treated with TPA in past for his CVA.  I last saw him in 2020.    Since I last saw him he thinks he is in and out of atrial fibrillation although he was in fibrillation when I saw him previously and he is now.  He had Covid recently and was just cleared to come back to work.  He said he had some loss of smell and taste.  He had muscle soreness and cough.  He has had some slightly decreased exercise tolerance during activity such as climbing the stairs.  He is not feeling any irregular heartbeat.  He tolerates anticoagulation.  He is not having any chest pressure, neck or arm discomfort.  He has had no new weight gain or edema.  He works at [employer].   Past Medical History:  Diagnosis Date  . Atrial fibrillation ([facility]) 10/29/2010  . Benign essential hypertension 12/15/2008  . Cardiomyopathy 11/28/2010  . History of chicken pox   . Renal insufficiency 11/28/2010    History reviewed. No pertinent surgical history.   Current Outpatient Medications  Medication Sig Dispense Refill  . allopurinol (ZYLOPRIM) 100 MG tablet Take 1 tablet (100 mg total) by mouth daily. 90 tablet 0  . colchicine 0.6 MG tablet TAKE 1 TAB BY MOUTH AT ONSET OF FLARE THEN 1/2 TAB 1 HOUR LATER. MAY TAKE 1/2 TAB 2 TIMES A DAY AS NEEDED THEREAFTER DURING GOUT FLARE 90 tablet 0  . diltiazem (CARDIZEM CD) 240 MG 24 hr capsule Take 1 capsule (240 mg total) by mouth daily. 90 capsule 0  . labetalol (NORMODYNE) 100 MG tablet Take 1 tablet (100 mg total) by mouth 2 (two) times daily. 180 tablet 0  . olmesartan  (BENICAR) 40 MG tablet Take 1 tablet (40 mg total) by mouth daily. 90 tablet 0  . XARELTO 20 MG TABS tablet TAKE 1 TABLET BY MOUTH ONCE DAILY WITH SUPPER 90 tablet 1  . hydrALAZINE (APRESOLINE) 10 MG tablet Take 1 tablet (10 mg total) by mouth in the morning and at bedtime. 60 tablet 11   No current facility-administered medications for this visit.    Allergies:   Patient has no known allergies.    ROS:  Please see the history of present illness.   Otherwise, review of systems are positive for none.   All other systems are reviewed and negative.    PHYSICAL EXAM: VS:  BP (!) 180/132   Pulse 76   Ht 6\' 1"  (1.854 m)   Wt (!) 312 lb 6.4 oz (141.7 kg)   SpO2 97%   BMI 41.22 kg/m  , BMI Body mass index is 41.22 kg/m. GENERAL:  Well appearing NECK:  No jugular venous distention, waveform within normal limits, carotid upstroke brisk and symmetric, no bruits, no thyromegaly LUNGS:  Clear to auscultation bilaterally CHEST:  Unremarkable HEART:  PMI not displaced or sustained,S1 and S2 within normal limits, no S3, no clicks, no rubs, no murmurs, irregular ABD:  Flat, positive bowel sounds  normal in frequency in pitch, no bruits, no rebound, no guarding, no midline pulsatile mass, no hepatomegaly, no splenomegaly EXT:  2 plus pulses throughout, no edema, no cyanosis no clubbing   EKG:  EKG is  ordered today. The ekg ordered today demonstrates atrial fib with rate 76, axis WNL, intervals WNL, non specific ST T wave changes.   No change from previous.    Recent Labs: No results found for requested labs within last 8760 hours.    Lipid Panel    Component Value Date/Time   CHOL 128 05/10/2019 1522   TRIG 74.0 05/10/2019 1522   HDL 42.00 05/10/2019 1522   CHOLHDL 3 05/10/2019 1522   VLDL 14.8 05/10/2019 1522   LDLCALC 71 05/10/2019 1522      Wt Readings from Last 3 Encounters:  07/25/20 (!) 312 lb 6.4 oz (141.7 kg)  07/27/19 (!) 320 lb 9.6 oz (145.4 kg)  05/10/19 (!) 312 lb  (141.5 kg)      Other studies Reviewed: Additional studies/ records that were reviewed today include: None Review of the above records demonstrates:  NA   ASSESSMENT AND PLAN:  ATRIAL FIB:  Mr. Nikesh Teschner. has a [name]2DS2 - VASc score of 3.   I believe he is in this rhythm all the time.  I am going to apply a 3-day monitor to make sure that is the case and to make sure he has good rate control.  I am going to check blood work as its been over a year to make sure he is not having any anemia on anticoagulation.  He otherwise tolerates this rhythm.   CVA: He has mild residual left arm and hand weakness.  No change in therapy.   NON ISCHEMIC CARDIOMYOPATHY: His last ejection fraction was 55 to 60%.  This was improved from 50 to 55%.  No change in therapy.    HTN:    His blood pressure is not controlled.  He did not tolerate spironolactone which caused breast tenderness.  He did not tolerate hydrochlorothiazide which exacerbated gout.  I am going to have to add hydralazine 10 mg twice daily.   OBESITY:    We have previously talked about weight loss strategies.   COVID EDUCATION: He will get the Covid vaccine when he is able having just recovered from Covid.  Current medicines are reviewed at length with the patient today.  The patient does not have concerns regarding medicines.  The following changes have been made:   As above  Labs/ tests ordered today include:   Orders Placed This Encounter  Procedures  . Basic metabolic panel  . CBC  . LONG TERM MONITOR (3-14 DAYS)  . EKG 12-Lead     Disposition:   FU with me in one year.   Signed, Rollene Rotunda, MD  07/25/2020 9:40 AM    [facility]

## 2020-07-25 ENCOUNTER — Encounter: Payer: Self-pay | Admitting: *Deleted

## 2020-07-25 ENCOUNTER — Ambulatory Visit (INDEPENDENT_AMBULATORY_CARE_PROVIDER_SITE_OTHER): Payer: No Typology Code available for payment source | Admitting: Cardiology

## 2020-07-25 ENCOUNTER — Other Ambulatory Visit: Payer: Self-pay | Admitting: Cardiology

## 2020-07-25 ENCOUNTER — Encounter: Payer: Self-pay | Admitting: Cardiology

## 2020-07-25 ENCOUNTER — Other Ambulatory Visit: Payer: Self-pay

## 2020-07-25 VITALS — BP 180/132 | HR 76 | Ht 73.0 in | Wt 312.4 lb

## 2020-07-25 DIAGNOSIS — I4891 Unspecified atrial fibrillation: Secondary | ICD-10-CM | POA: Diagnosis not present

## 2020-07-25 DIAGNOSIS — I639 Cerebral infarction, unspecified: Secondary | ICD-10-CM

## 2020-07-25 DIAGNOSIS — Z7189 Other specified counseling: Secondary | ICD-10-CM

## 2020-07-25 DIAGNOSIS — I10 Essential (primary) hypertension: Secondary | ICD-10-CM | POA: Diagnosis not present

## 2020-07-25 DIAGNOSIS — I428 Other cardiomyopathies: Secondary | ICD-10-CM

## 2020-07-25 MED ORDER — HYDRALAZINE HCL 10 MG PO TABS: 10.0000 mg | ORAL_TABLET | Freq: Two times a day (BID) | ORAL | 11 refills | Status: DC

## 2020-07-25 MED FILL — hydrALAZINE HCL 10 MG TABS: 10 | 30 days supply | Qty: 60 | Fill #0

## 2020-07-25 NOTE — Progress Notes (Signed)
Patient ID: Thomas Quant., male   DOB: 08/10/1981, 39 y.o.   MRN: [id number] Patient enrolled for [employer] to ship a 3 day ZIO XT long term holter monitor to his home.

## 2020-07-25 NOTE — Patient Instructions (Signed)
Medication Instructions:  Start Hydralazine 10mg , take 1 tablet twice a day *If you need a refill on your cardiac medications before your next appointment, please call your pharmacy*  Lab Work: Your physician recommends that you return for lab work today (CBC, BMP) If you have labs (blood work) drawn today and your tests are completely normal, you will receive your results only by: MyChart Message (if you have MyChart) OR . A paper copy in the mail If you have any lab test that is abnormal or we need to change your treatment, we will call you to review the results.  Testing/Procedures: Cardiac Monitor - Zio for 3 days  Follow-Up: At [facility], you and your health needs are our priority.  As part of our continuing mission to provide you with exceptional heart care, we have created designated Provider Care Teams.  These Care Teams include your primary Cardiologist (physician) and Advanced Practice Providers (APPs -  Physician Assistants and Nurse Practitioners) who all work together to provide you with the care you need, when you need it.  Your next appointment:   12 month(s) You will receive a reminder letter in the mail two months in advance. If you don't receive a letter, please call our office to schedule the follow-up appointment.  The format for your next appointment:   In Person  Provider:   [facility] MD  Other Instructions ZIO XT- Long Term Monitor Instructions   Your physician has requested you wear your ZIO patch monitor_3_days.   This is a single patch monitor.  [employer] supplies one patch monitor per enrollment.  Additional stickers are not available.   Please do not apply patch if you will be having a Nuclear Stress Test, Echocardiogram, Cardiac CT, MRI, or Chest Xray during the time frame you would be wearing the monitor. The patch cannot be worn during these tests.  You cannot remove and re-apply the ZIO XT patch monitor.   Your ZIO patch monitor will be  sent USPS Priority mail from [facility] directly to your home address. The monitor may also be mailed to a PO BOX if home delivery is not available.   It may take 3-5 days to receive your monitor after you have been enrolled.   Once you have received you monitor, please review enclosed instructions.  Your monitor has already been registered assigning a specific monitor serial # to you.   Applying the monitor   Shave hair from upper left chest.   Hold abrader disc by orange tab.  Rub abrader in 40 strokes over left upper chest as indicated in your monitor instructions.   Clean area with 4 enclosed alcohol pads .  Use all pads to assure are is cleaned thoroughly.  Let dry.   Apply patch as indicated in monitor instructions.  Patch will be place under collarbone on left side of chest with arrow pointing upward.   Rub patch adhesive wings for 2 minutes.Remove white label marked "1".  Remove white label marked "2".  Rub patch adhesive wings for 2 additional minutes.   While looking in a mirror, press and release button in center of patch.  A small green light will flash 3-4 times .  This will be your only indicator the monitor has been turned on.     Do not shower for the first 24 hours.  You may shower after the first 24 hours.   Press button if you feel a symptom. You will hear a small click.  Record Date, Time and Symptom in the Patient Log Book.   When you are ready to remove patch, follow instructions on last 2 pages of Patient Log Book.  Stick patch monitor onto last page of Patient Log Book.   Place Patient Log Book in [place] box.  Use locking tab on box and tape box closed securely.  The [employer] and [employer] has [employer] on it.  Please place in mailbox as soon as possible.  Your physician should have your test results approximately 7 days after the monitor has been mailed back to [facility].   Call [facility] Customer Care at [phone] if you have questions  regarding your ZIO XT patch monitor.  Call them immediately if you see an orange light blinking on your monitor.   If your monitor falls off in less than 4 days contact our Monitor department at [phone].  If your monitor becomes loose or falls off after 4 days call [employer] at [phone] for suggestions on securing your monitor.

## 2020-07-28 ENCOUNTER — Other Ambulatory Visit (INDEPENDENT_AMBULATORY_CARE_PROVIDER_SITE_OTHER): Payer: No Typology Code available for payment source

## 2020-07-28 DIAGNOSIS — I4891 Unspecified atrial fibrillation: Secondary | ICD-10-CM | POA: Diagnosis not present

## 2020-07-28 LAB — BASIC METABOLIC PANEL
BUN/Creatinine Ratio: 12 (ref 9–20)
BUN: 18 mg/dL (ref 6–20)
CO2: 25 mmol/L (ref 20–29)
Calcium: 9.5 mg/dL (ref 8.7–10.2)
Chloride: 102 mmol/L (ref 96–106)
Creatinine, Ser: 1.45 mg/dL — ABNORMAL HIGH (ref 0.76–1.27)
GFR calc Af Amer: 70 mL/min/{1.73_m2} (ref >59)
GFR calc non Af Amer: 61 mL/min/{1.73_m2} (ref >59)
Glucose: 89 mg/dL (ref 65–99)
Potassium: 4.5 mmol/L (ref 3.5–5.2)
Sodium: 139 mmol/L (ref 134–144)

## 2020-07-28 LAB — CBC
Hematocrit: 42.8 % (ref 37.5–51.0)
Hemoglobin: 14.5 g/dL (ref 13.0–17.7)
MCH: 28.9 pg (ref 26.6–33.0)
MCHC: 33.9 g/dL (ref 31.5–35.7)
MCV: 85 fL (ref 79–97)
Platelets: 189 10*3/uL (ref 150–450)
RBC: 5.01 x10E6/uL (ref 4.14–5.80)
RDW: 13.2 % (ref 11.6–15.4)
WBC: 3.5 10*3/uL (ref 3.4–10.8)

## 2020-08-24 MED FILL — hydrALAZINE HCL 10 MG TABS: 10 | 30 days supply | Qty: 60 | Fill #1

## 2020-08-29 ENCOUNTER — Encounter: Payer: No Typology Code available for payment source | Admitting: Internal Medicine

## 2020-09-10 ENCOUNTER — Encounter: Payer: Self-pay | Admitting: Internal Medicine

## 2020-09-11 ENCOUNTER — Other Ambulatory Visit: Payer: Self-pay

## 2020-09-11 ENCOUNTER — Emergency Department (INDEPENDENT_AMBULATORY_CARE_PROVIDER_SITE_OTHER): Payer: No Typology Code available for payment source

## 2020-09-11 ENCOUNTER — Emergency Department
Admission: EM | Admit: 2020-09-11 | Discharge: 2020-09-11 | Disposition: A | Discharge status: Home / Self Care | Payer: No Typology Code available for payment source | Source: Home / Self Care

## 2020-09-11 ENCOUNTER — Encounter: Payer: Self-pay | Admitting: Emergency Medicine

## 2020-09-11 DIAGNOSIS — M25511 Pain in right shoulder: Secondary | ICD-10-CM

## 2020-09-11 DIAGNOSIS — W109XXA Fall (on) (from) unspecified stairs and steps, initial encounter: Secondary | ICD-10-CM

## 2020-09-11 DIAGNOSIS — S4291XA Fracture of right shoulder girdle, part unspecified, initial encounter for closed fracture: Secondary | ICD-10-CM

## 2020-09-11 NOTE — ED Provider Notes (Signed)
[facility]    CSN: [id number] Arrival date & time: 09/11/20  1725      History   Chief Complaint Chief Complaint  Patient presents with  . Shoulder Injury    HPI Thomas [name]. is a 39 y.o. male.   HPI Patient reports walking in home and ambulating down the stairs, missing a step, landing with full body weight onto his right shoulder x 1 day ago. He reports a history of a rotator cuff injury. Since fall, the patient has been unable to rotate or extend his shoulder. Endorses shooting pain down the right arm. He has take tylenol 650 mg 2-3 times daily since injury occurred. He is currently waking a blood thinner due to atrial fibrillation and history of CVA.  Past Medical History:  Diagnosis Date  . Atrial fibrillation (HCC) 10/29/2010  . Benign essential hypertension 12/15/2008  . Cardiomyopathy 11/28/2010  . History of chicken pox   . Renal insufficiency 11/28/2010    Patient Active Problem List   Diagnosis Date Noted  . Educated about COVID-19 virus infection 07/24/2020  . Gout 05/10/2019  . Atrial fibrillation with RVR (HCC) 12/08/2012  . 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 CKD (chronic kidney disease) stage 3, GFR 30-59 ml/min (HCC) 11/28/2010    History reviewed. No pertinent surgical history.     Home Medications    Prior to Admission medications   Medication Sig Start Date End Date Taking? Authorizing Provider  allopurinol (ZYLOPRIM) 100 MG tablet Take 1 tablet (100 mg total) by mouth daily. 07/19/20   Lorre Munroe, NP  colchicine 0.6 MG tablet TAKE 1 TAB BY MOUTH AT ONSET OF FLARE THEN 1/2 TAB 1 HOUR LATER. MAY TAKE 1/2 TAB 2 TIMES A DAY AS NEEDED THEREAFTER DURING GOUT FLARE 07/19/20   Lorre Munroe, NP  diltiazem (CARDIZEM CD) 240 MG 24 hr capsule Take 1 capsule (240 mg total) by mouth daily. 07/19/20   Lorre Munroe, NP  hydrALAZINE (APRESOLINE) 10 MG tablet  Take 1 tablet (10 mg total) by mouth in the morning and at bedtime. 07/25/20   Rollene Rotunda, MD  labetalol (NORMODYNE) 100 MG tablet Take 1 tablet (100 mg total) by mouth 2 (two) times daily. 07/19/20   Lorre Munroe, NP  olmesartan (BENICAR) 40 MG tablet Take 1 tablet (40 mg total) by mouth daily. 07/19/20   Lorre Munroe, NP  XARELTO 20 MG TABS tablet TAKE 1 TABLET BY MOUTH ONCE DAILY WITH SUPPER 10/01/19   Lorre Munroe, NP    Family History Family History  Problem Relation Age of Onset  . Hypertension Mother   . Diabetes Mother   . Hypertension Father   . Glaucoma Father   . Hyperlipidemia Other        Parent    Social History Social History   Tobacco Use  . Smoking status: Never Smoker  . Smokeless tobacco: Never Used  Vaping Use  . Vaping Use: Never used  Substance Use Topics  . Alcohol use: Yes    Alcohol/week: 1.0 standard drink    Types: 1 Shots of liquor per week    Comment: rare  . Drug use: No     Allergies   Patient has no known allergies.  Review of Systems Review of Systems Pertinent negatives listed in HPI Physical Exam Triage Vital Signs ED Triage Vitals  Enc Vitals Group     BP 09/11/20  1744 (!) 166/141     Pulse Rate 09/11/20 1744 86     Resp --      Temp 09/11/20 1744 98.1 F (36.7 C)     Temp Source 09/11/20 1744 Oral     SpO2 09/11/20 1744 98 %     Weight 09/11/20 1745 (!) 313 lb (142 kg)     Height 09/11/20 1745 6\' 1"  (1.854 m)     Head Circumference --      Peak Flow --      Pain Score 09/11/20 1745 7     Pain Loc --      Pain Edu? --      Excl. in GC? --    No data found.  Updated Vital Signs BP (!) 166/141 (BP Location: Right Arm)   Pulse 86   Temp 98.1 F (36.7 C) (Oral)   Ht 6\' 1"  (1.854 m)   Wt (!) 313 lb (142 kg)   SpO2 98%   BMI 41.30 kg/m   Visual Acuity Right Eye Distance:   Left Eye Distance:   Bilateral Distance:    Right Eye Near:   Left Eye Near:    Bilateral Near:     Physical  Exam Constitutional:      Appearance: He is obese.  Cardiovascular:     Rate and Rhythm: Normal rate.     Pulses: Normal pulses.  Musculoskeletal:     Right shoulder: Tenderness and bony tenderness present. Decreased range of motion. Decreased strength.     Cervical back: Normal range of motion.  Neurological:     Mental Status: He is alert.     GCS: GCS eye subscore is 4. GCS verbal subscore is 5. GCS motor subscore is 6.     Sensory: Sensation is intact.     Gait: Gait is intact.  Psychiatric:        Attention and Perception: Attention normal.        Mood and Affect: Mood normal.      UC Treatments / Results  Labs (all labs ordered are listed, but only abnormal results are displayed) Labs Reviewed - No data to display  EKG   Radiology DG Shoulder Right  Result Date: 09/11/2020 CLINICAL DATA:  39 year old male with fall and right shoulder pain. EXAM: RIGHT SHOULDER - 2+ VIEW COMPARISON:  Chest radiograph dated 12/07/2012. FINDINGS: There is apparent age indeterminate fracture of the acromion, likely acute. Clinical correlation is recommended. CT may provide better evaluation. There is no dislocation. The bones are well mineralized. No arthritic changes. The soft tissues are unremarkable. IMPRESSION: Age indeterminate, likely acute, fracture of the acromion. CT may provide better evaluation. Electronically Signed   By: 24 M.D.   On: 09/11/2020 18:35    Procedures Procedures (including critical care time)  Medications Ordered in UC Medications - No data to display  Initial Impression / Assessment and Plan / UC Course  I have reviewed the triage vital signs and the nursing notes.  Pertinent labs & imaging results that were available during my care of the patient were reviewed by me and considered in my medical decision making (see chart for details).     Imaging of the right shoulder indicates a possible right acromion fracture involving the right shoulder.   Patient placed in a sling and advised to follow-up with orthopedics tomorrow.  Patient is given information to follow-up with orthopedics tomorrow given injuries related to shoulder.  Patient offered pain medication however declined ports  he will continue with Tylenol as this has been effective in managing pain.  Patient provided a work note patient stable for discharge. Final Clinical Impressions(s) / UC Diagnoses   Final diagnoses:  Closed fracture of right shoulder, initial encounter     Discharge Instructions     Follow-up at [facility] here in [place].  Walk-in accepted. [address] [place] [address] [phone]      ED Prescriptions    None     PDMP not reviewed this encounter.   Bing Neighbors, FNP 09/13/20 1330

## 2020-09-11 NOTE — ED Triage Notes (Signed)
Rt shoulder injury yesterday fell down some stairs and landed with arm up, dislocated, popped back in, past rotator cuff injury.

## 2020-09-11 NOTE — Discharge Instructions (Signed)
Follow-up at [facility] here in [place].  Walk-in accepted. [address] [place] [address] [phone]

## 2020-09-18 ENCOUNTER — Encounter: Payer: No Typology Code available for payment source | Admitting: Internal Medicine

## 2020-09-26 ENCOUNTER — Other Ambulatory Visit: Payer: Self-pay | Admitting: Internal Medicine

## 2020-09-26 DIAGNOSIS — I482 Chronic atrial fibrillation, unspecified: Secondary | ICD-10-CM

## 2020-09-27 ENCOUNTER — Other Ambulatory Visit: Payer: Self-pay | Admitting: Internal Medicine

## 2020-09-27 MED FILL — XARELTO 20 MG TABLET: 20 | 90 days supply | Qty: 90 | Fill #0

## 2020-09-27 NOTE — Telephone Encounter (Signed)
Pt has upcoming appt for CPE... this Rx was last filled 09/2019 with 1 refill... is pt supposed to continue medication?

## 2020-09-27 NOTE — Telephone Encounter (Signed)
Yes, on treatment for Afib with RVR

## 2020-10-11 ENCOUNTER — Other Ambulatory Visit: Payer: Self-pay | Admitting: Internal Medicine

## 2020-10-11 MED FILL — hydrALAZINE HCL 10 MG TABS: 10 | 30 days supply | Qty: 60 | Fill #2

## 2020-10-12 ENCOUNTER — Other Ambulatory Visit: Payer: Self-pay | Admitting: Internal Medicine

## 2020-10-12 MED FILL — ALLOPURINOL 100 MG TABS: 100 | 90 days supply | Qty: 90 | Fill #0

## 2020-10-16 ENCOUNTER — Telehealth: Payer: Self-pay

## 2020-10-16 ENCOUNTER — Encounter: Payer: No Typology Code available for payment source | Admitting: Internal Medicine

## 2020-10-16 NOTE — Progress Notes (Deleted)
Subjective:    Patient ID: Thomas Quant., male    DOB: 06-24-1981, 39 y.o.   MRN: [id number]  HPI  Patient presents the clinic today for his annual exam.  He is also due to follow-up chronic conditions.  A:fib Managed on Diltiazem, Carvedilol and Xarelto.  He follows with cardiology.  ECG from 8/2021reviewed.  HTN: His BP today is.  He is taking Diltiazem, Carvedilol and Olmesartan as prescribed.  ECG from 07/2020 reviewed.  CKD 3: His last creatinine was 1.45, GFR was 70, 07/2020.  He is on Olmesartan for renal protection.  He does not follow with nephrology.  CVA with LeftHhemiparesis: He is not taking any cholesterol-lowering medication.  He is taking Diltiazem, C our Saturday clinic arvedilol and Xarelto as prescribed.  He does not follow with neurology.  Gout: He denies recent flare on A that the llopurinol.  He has Colchicine to take as needed for flares.  Flu: Tetanus: 2010 Covid: Dentist:  Diet: Exercise:  Review of Systems      Past Medical History:  Diagnosis Date   Atrial fibrillation (HCC) 10/29/2010   Benign essential hypertension 12/15/2008   Cardiomyopathy 11/28/2010   History of chicken pox    Renal insufficiency 11/28/2010    Current Outpatient Medications  Medication Sig Dispense Refill   allopurinol (ZYLOPRIM) 100 MG tablet TAKE 1 TABLET BY MOUTH DAILY 90 tablet 0   colchicine 0.6 MG tablet TAKE 1 TAB BY MOUTH AT ONSET OF FLARE THEN 1/2 TAB 1 HOUR LATER. MAY TAKE 1/2 TAB 2 TIMES A DAY AS NEEDED THEREAFTER DURING GOUT FLARE 90 tablet 0   diltiazem (CARDIZEM CD) 240 MG 24 hr capsule Take 1 capsule (240 mg total) by mouth daily. 90 capsule 0   hydrALAZINE (APRESOLINE) 10 MG tablet Take 1 tablet (10 mg total) by mouth in the morning and at bedtime. 60 tablet 11   labetalol (NORMODYNE) 100 MG tablet Take 1 tablet (100 mg total) by mouth 2 (two) times daily. 180 tablet 0   olmesartan (BENICAR) 40 MG tablet Take 1 tablet (40 mg total) by mouth  daily. 90 tablet 0   XARELTO 20 MG TABS tablet TAKE 1 TABLET BY MOUTH ONCE DAILY WITH SUPPER (MUST SCHEDULE FOLLOW UP VISIT FOR DECEMBER) 90 tablet 1   No current facility-administered medications for this visit.    No Known Allergies  Family History  Problem Relation Age of Onset   Hypertension Mother    Diabetes Mother    Hypertension Father    Glaucoma Father    Hyperlipidemia Other        Parent    Social History   Socioeconomic History   Marital status: Married    Spouse name: Not on file   Number of children: Not on file   Years of education: 14   Highest education level: Not on file  Occupational History   Occupation: Research scientist (physical sciences) Health    Employer: [employer]  Tobacco Use   Smoking status: Never Smoker   Smokeless tobacco: Never Used  Vaping Use   Vaping Use: Never used  Substance and Sexual Activity   Alcohol use: Yes    Alcohol/week: 1.0 standard drink    Types: 1 Shots of liquor per week    Comment: rare   Drug use: No   Sexual activity: Yes    Birth control/protection: Condom  Other Topics Concern   Not on file  Social History Narrative   Neither of his parents  nor any siblings have any cardiac or heart rhythm issues. He has 2 grandparents that were on blood thinners but doesn't know why.      Regular exercise-no   Caffeine Use-yes   Social Determinants of Health   Financial Resource Strain:    Difficulty of Paying Living Expenses: Not on file  Food Insecurity:    Worried About Programme researcher, broadcasting/film/video in the Last Year: Not on file   [employer] of Food in the Last Year: Not on file  Transportation Needs:    Lack of Transportation (Medical): Not on file   Lack of Transportation (Non-Medical): Not on file  Physical Activity:    Days of Exercise per Week: Not on file   Minutes of Exercise per Session: Not on file  Stress:    Feeling of Stress : Not on file  Social Connections:    Frequency of Communication with  Friends and Family: Not on file   Frequency of Social Gatherings with Friends and Family: Not on file   Attends Religious Services: Not on file   Active Member of Clubs or Organizations: Not on file   Attends Banker Meetings: Not on file   Marital Status: Not on file  Intimate Partner Violence:    Fear of Current or Ex-Partner: Not on file   Emotionally Abused: Not on file   Physically Abused: Not on file   Sexually Abused: Not on file     Constitutional: Denies fever, malaise, fatigue, headache or abrupt weight changes.  HEENT: Denies eye pain, eye redness, ear pain, ringing in the ears, wax buildup, runny nose, nasal congestion, bloody nose, or sore throat. Respiratory: Denies difficulty breathing, shortness of breath, cough or sputum production.   Cardiovascular: Denies chest pain, chest tightness, palpitations or swelling in the hands or feet.  Gastrointestinal: Denies abdominal pain, bloating, constipation, diarrhea or blood in the stool.  GU: Denies urgency, frequency, pain with urination, burning sensation, blood in urine, odor or discharge. Musculoskeletal: Pt reports left side weakness. Denies decrease in range of motion, difficulty with gait, muscle pain or joint pain and swelling.  Skin: Denies redness, rashes, lesions or ulcercations.  Neurological: Denies dizziness, difficulty with memory, difficulty with speech or problems with balance and coordination.  Psych: Denies anxiety, depression, SI/HI.  No other specific complaints in a complete review of systems (except as listed in HPI above).  Objective:   Physical Exam    There were no vitals taken for this visit. Wt Readings from Last 3 Encounters:  09/11/20 (!) 313 lb (142 kg)  07/25/20 (!) 312 lb 6.4 oz (141.7 kg)  07/27/19 (!) 320 lb 9.6 oz (145.4 kg)    General: Appears their stated age, well developed, well nourished in NAD. Skin: Warm, dry and intact. No rashes, lesions or ulcerations  noted. HEENT: Head: normal shape and size; Eyes: sclera white, no icterus, conjunctiva pink, PERRLA and EOMs intact; Ears: Tm's gray and intact, normal light reflex; Nose: mucosa pink and moist, septum midline; Throat/Mouth: Teeth present, mucosa pink and moist, no exudate, lesions or ulcerations noted.  Neck:  Neck supple, trachea midline. No masses, lumps or thyromegaly present.  Cardiovascular: Normal rate and rhythm. S1,S2 noted.  No murmur, rubs or gallops noted. No JVD or BLE edema. No carotid bruits noted. Pulmonary/Chest: Normal effort and positive vesicular breath sounds. No respiratory distress. No wheezes, rales or ronchi noted.  Abdomen: Soft and nontender. Normal bowel sounds. No distention or masses noted. Liver, spleen and  kidneys non palpable. Musculoskeletal: Normal range of motion. No signs of joint swelling. No difficulty with gait.  Neurological: Alert and oriented. Cranial nerves II-XII grossly intact. Coordination normal.  Psychiatric: Mood and affect normal. Behavior is normal. Judgment and thought content normal.   EKG:  BMET    Component Value Date/Time   NA 139 07/28/2020 0944   K 4.5 07/28/2020 0944   CL 102 07/28/2020 0944   CO2 25 07/28/2020 0944   GLUCOSE 89 07/28/2020 0944   GLUCOSE 84 05/10/2019 1522   BUN 18 07/28/2020 0944   CREATININE 1.45 (H) 07/28/2020 0944   CREATININE 1.69 (H) 02/13/2017 1504   CALCIUM 9.5 07/28/2020 0944   GFRNONAA 61 07/28/2020 0944   GFRAA 70 07/28/2020 0944    Lipid Panel     Component Value Date/Time   CHOL 128 05/10/2019 1522   TRIG 74.0 05/10/2019 1522   HDL 42.00 05/10/2019 1522   CHOLHDL 3 05/10/2019 1522   VLDL 14.8 05/10/2019 1522   LDLCALC 71 05/10/2019 1522    CBC    Component Value Date/Time   WBC 3.5 07/28/2020 0944   WBC 3.7 (L) 05/10/2019 1522   RBC 5.01 07/28/2020 0944   RBC 4.94 05/10/2019 1522   HGB 14.5 07/28/2020 0944   HCT 42.8 07/28/2020 0944   PLT 189 07/28/2020 0944   MCV 85 07/28/2020  0944   MCH 28.9 07/28/2020 0944   MCH 30.0 02/13/2017 1504   MCHC 33.9 07/28/2020 0944   MCHC 32.4 05/10/2019 1522   RDW 13.2 07/28/2020 0944   LYMPHSABS 1.8 12/21/2013 1621   MONOABS 0.3 12/21/2013 1621   EOSABS 0.1 12/21/2013 1621   BASOSABS 0.0 12/21/2013 1621    Hgb A1C Lab Results  Component Value Date   HGBA1C 5.7 05/10/2019          Assessment & Plan:    Nicki Reaper, NP This visit occurred during the SARS-CoV-2 public health emergency.  Safety protocols were in place, including screening questions prior to the visit, additional usage of staff PPE, and extensive cleaning of exam room while observing appropriate contact time as indicated for disinfecting solutions.

## 2020-10-16 NOTE — Telephone Encounter (Signed)
[facility] Night - Client Nonclinical Telephone Record Recruitment consultant [facility] Night - Client Client Site [facility] [place] - Night Physician Nicki Reaper - NP Contact Type Call Who Is Calling Patient / Member / Family / Caregiver Caller Name Edd Reppert Caller Phone Number [phone] Patient Name Thomas [name] Patient DOB 05/31/1981 Call Type Message Only Information Provided Reason for Call Request to Cancel Office Appointment Initial Comment Caller states he needs to cancel Additional Comment Its 830am Disp. Time Disposition Final User 10/16/2020 7:24:31 AM General Information Provided Yes Lois Huxley Call Closed By: Lois Huxley Transaction Date/Time: 10/16/2020 7:22:37 AM (ET)

## 2020-10-16 NOTE — Telephone Encounter (Signed)
Will get no show fee

## 2020-11-01 ENCOUNTER — Other Ambulatory Visit: Payer: Self-pay | Admitting: Internal Medicine

## 2020-11-01 DIAGNOSIS — I482 Chronic atrial fibrillation, unspecified: Secondary | ICD-10-CM

## 2020-11-03 ENCOUNTER — Other Ambulatory Visit: Payer: Self-pay | Admitting: Internal Medicine

## 2020-11-03 DIAGNOSIS — I482 Chronic atrial fibrillation, unspecified: Secondary | ICD-10-CM

## 2020-11-03 MED FILL — OLMESARTAN MEDOXOMIL 40 MG: 40 | 90 days supply | Qty: 90 | Fill #0

## 2020-11-03 MED FILL — DILTIAZEM 24HR ER 240 MG CA: 240 | 90 days supply | Qty: 90 | Fill #0

## 2020-11-07 ENCOUNTER — Ambulatory Visit: Payer: No Typology Code available for payment source | Admitting: Internal Medicine

## 2020-11-23 ENCOUNTER — Other Ambulatory Visit: Payer: Self-pay | Admitting: Internal Medicine

## 2020-11-23 MED FILL — hydrALAZINE HCL 10 MG TABS: 10 | 30 days supply | Qty: 60 | Fill #3

## 2020-11-23 MED FILL — COLCHICINE 0.6 MG TABS: 0.6 | 90 days supply | Qty: 90 | Fill #0

## 2020-12-22 ENCOUNTER — Other Ambulatory Visit: Payer: Self-pay | Admitting: Internal Medicine

## 2020-12-22 DIAGNOSIS — I482 Chronic atrial fibrillation, unspecified: Secondary | ICD-10-CM

## 2020-12-22 NOTE — Telephone Encounter (Signed)
All of these medications were filled Dec 2021 90 day supply, pt has upcoming appt 01/05/2021, will wait until appointment

## 2020-12-25 ENCOUNTER — Other Ambulatory Visit (HOSPITAL_COMMUNITY): Payer: Self-pay | Admitting: Emergency Medicine

## 2020-12-25 ENCOUNTER — Other Ambulatory Visit: Payer: Self-pay

## 2020-12-25 ENCOUNTER — Other Ambulatory Visit: Payer: Self-pay | Admitting: Internal Medicine

## 2020-12-25 ENCOUNTER — Ambulatory Visit (HOSPITAL_COMMUNITY)
Admission: EM | Admit: 2020-12-25 | Discharge: 2020-12-25 | Disposition: A | Discharge status: Home / Self Care | Payer: No Typology Code available for payment source | Attending: Emergency Medicine | Admitting: Emergency Medicine

## 2020-12-25 ENCOUNTER — Encounter (HOSPITAL_COMMUNITY): Payer: Self-pay

## 2020-12-25 DIAGNOSIS — I482 Chronic atrial fibrillation, unspecified: Secondary | ICD-10-CM

## 2020-12-25 DIAGNOSIS — L97319 Non-pressure chronic ulcer of right ankle with unspecified severity: Secondary | ICD-10-CM

## 2020-12-25 MED ORDER — CEPHALEXIN 500 MG PO CAPS: 500.0000 mg | ORAL_CAPSULE | Freq: Four times a day (QID) | ORAL | 0 refills | Status: DC

## 2020-12-25 MED ORDER — BACITRACIN ZINC 500 UNIT/GM EX OINT
TOPICAL_OINTMENT | CUTANEOUS | Status: AC
  Filled 2020-12-25: qty 28.35

## 2020-12-25 MED FILL — hydrALAZINE HCL 10 MG TABS: 10 | 30 days supply | Qty: 60 | Fill #4

## 2020-12-25 MED FILL — CEPHALEXIN 500 MG CAPSULE: 500 | 7 days supply | Qty: 28 | Fill #0

## 2020-12-25 MED FILL — XARELTO 20 MG TABLET: 20 | 90 days supply | Qty: 90 | Fill #1

## 2020-12-25 NOTE — Discharge Instructions (Signed)
Take the antibiotic as directed.    Keep your wound clean and dry.  Wash it gently twice a day with soap and water.  Apply an antibiotic cream and bandage twice a day.    Schedule an appointment with your primary care provider as soon as possible for a wound recheck.

## 2020-12-25 NOTE — ED Triage Notes (Signed)
Patient states he had a accident at his residence and injured his shoulder and his right ankle. Pt states a wound began developing about 2 weeks after being seen initially. Pt states he noticed it in October. Patient is aox4 and ambulatory.

## 2020-12-25 NOTE — ED Provider Notes (Signed)
[facility]    CSN: [id number] Arrival date & time: 12/25/20  1556      History   Chief Complaint Chief Complaint  Patient presents with  . Wound Check    Since end of october    HPI Thomas [name]. is a 40 y.o. male.   Patient presents with an open wound on his right ankle x3 months.  He states he fell 3 months ago, hurt his shoulder, and had an abrasion on his right ankle.  He states the abrasion has gotten worse and is now an open wound.  He has been treating it at home with Neosporin.  He denies fever, chills, numbness, weakness, paresthesias, or other symptoms.  He was seen at [facility] urgent care on 09/11/2020; diagnosed with closed fracture of the right shoulder, acromion; treated with a sling and advised to follow-up with orthopedics.  He was seen by orthopedics on 09/12/2020; diagnosed with right shoulder pain and injury; treated symptomatically.  Medical history includes cardiomyopathy, hypertension, atrial fibrillation, CVA with hemiplegia, CKD. He denies history of diabetes or problems with circulation.  The history is provided by the patient and medical records.    Past Medical History:  Diagnosis Date  . Atrial fibrillation (HCC) 10/29/2010  . Benign essential hypertension 12/15/2008  . Cardiomyopathy 11/28/2010  . History of chicken pox   . Renal insufficiency 11/28/2010    Patient Active Problem List   Diagnosis Date Noted  . Gout 05/10/2019  . Atrial fibrillation with RVR (HCC) 12/08/2012  . 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 CKD (chronic kidney disease) stage 3, GFR 30-59 ml/min (HCC) 11/28/2010    History reviewed. No pertinent surgical history.     Home Medications    Prior to Admission medications   Medication Sig Start Date End Date Taking? Authorizing Provider  allopurinol (ZYLOPRIM) 100 MG tablet TAKE 1 TABLET BY MOUTH DAILY 10/12/20  Yes Baity,  Salvadore Oxford, NP  cephALEXin (KEFLEX) 500 MG capsule Take 1 capsule (500 mg total) by mouth 4 (four) times daily. 12/25/20  Yes Mickie Bail, NP  colchicine 0.6 MG tablet TAKE 1 TAB BY MOUTH AT ONSET OF FLARE THEN 1/2 TAB 1 HOUR LATER. MAY TAKE 1/2 TAB 2 TIMES A DAY AS NEEDED THEREAFTER DURING GOUT FLARE 11/23/20  Yes Lorre Munroe, NP  diltiazem (CARDIZEM CD) 240 MG 24 hr capsule TAKE 1 CAPSULE BY MOUTH DAILY 11/03/20  Yes Lorre Munroe, NP  hydrALAZINE (APRESOLINE) 10 MG tablet Take 1 tablet (10 mg total) by mouth in the morning and at bedtime. 07/25/20  Yes Rollene Rotunda, MD  labetalol (NORMODYNE) 100 MG tablet Take 1 tablet (100 mg total) by mouth 2 (two) times daily. 07/19/20  Yes Baity, Salvadore Oxford, NP  olmesartan (BENICAR) 40 MG tablet TAKE 1 TABLET BY MOUTH DAILY 11/03/20  Yes Baity, Salvadore Oxford, NP  XARELTO 20 MG TABS tablet TAKE 1 TABLET BY MOUTH ONCE DAILY WITH SUPPER (MUST SCHEDULE FOLLOW UP VISIT FOR DECEMBER) 09/27/20  Yes Baity, Salvadore Oxford, NP    Family History Family History  Problem Relation Age of Onset  . Hypertension Mother   . Diabetes Mother   . Hypertension Father   . Glaucoma Father   . Hyperlipidemia Other        Parent    Social History Social History   Tobacco Use  . Smoking status: Never Smoker  . Smokeless tobacco: Never Used  Vaping  Use  . Vaping Use: Never used  Substance Use Topics  . Alcohol use: Yes    Alcohol/week: 1.0 standard drink    Types: 1 Shots of liquor per week    Comment: rare  . Drug use: No     Allergies   Patient has no known allergies.   Review of Systems Review of Systems  Constitutional: Negative for chills and fever.  HENT: Negative for ear pain and sore throat.   Eyes: Negative for pain and visual disturbance.  Respiratory: Negative for cough and shortness of breath.   Cardiovascular: Negative for chest pain and palpitations.  Gastrointestinal: Negative for abdominal pain and vomiting.  Genitourinary: Negative for dysuria  and hematuria.  Musculoskeletal: Negative for arthralgias and back pain.  Skin: Positive for wound. Negative for color change.  Neurological: Negative for syncope, weakness and numbness.  All other systems reviewed and are negative.    Physical Exam Triage Vital Signs ED Triage Vitals  Enc Vitals Group     BP 12/25/20 1620 (!) 138/103     Pulse Rate 12/25/20 1620 (!) 56     Resp 12/25/20 1620 17     Temp --      Temp Source 12/25/20 1620 Oral     SpO2 12/25/20 1620 100 %     Weight --      Height --      Head Circumference --      Peak Flow --      Pain Score 12/25/20 1623 4     Pain Loc --      Pain Edu? --      Excl. in GC? --    No data found.  Updated Vital Signs BP (!) 138/103 (BP Location: Right Arm)   Pulse (!) 56   Resp 17   SpO2 100%   Visual Acuity Right Eye Distance:   Left Eye Distance:   Bilateral Distance:    Right Eye Near:   Left Eye Near:    Bilateral Near:     Physical Exam Vitals and nursing note reviewed.  Constitutional:      General: He is not in acute distress.    Appearance: He is well-developed and well-nourished. He is obese. He is not ill-appearing.  HENT:     Head: Normocephalic and atraumatic.     Mouth/Throat:     Mouth: Mucous membranes are moist.  Eyes:     Conjunctiva/sclera: Conjunctivae normal.  Cardiovascular:     Rate and Rhythm: Normal rate and regular rhythm.     Heart sounds: Normal heart sounds.  Pulmonary:     Effort: Pulmonary effort is normal. No respiratory distress.     Breath sounds: Normal breath sounds.  Abdominal:     Palpations: Abdomen is soft.     Tenderness: There is no abdominal tenderness.  Musculoskeletal:        General: No swelling, tenderness, deformity or edema. Normal range of motion.     Cervical back: Neck supple.  Skin:    General: Skin is warm and dry.     Findings: Lesion present.     Comments: 2 cm circular ulcer on right lateral ankle; scant yellow drainage.  Neurological:      General: No focal deficit present.     Mental Status: He is alert and oriented to person, place, and time.     Gait: Gait normal.  Psychiatric:        Mood and Affect: Mood and affect and  mood normal.        Behavior: Behavior normal.      UC Treatments / Results  Labs (all labs ordered are listed, but only abnormal results are displayed) Labs Reviewed - No data to display  EKG   Radiology No results found.  Procedures Procedures (including critical care time)  Medications Ordered in UC Medications - No data to display  Initial Impression / Assessment and Plan / UC Course  I have reviewed the triage vital signs and the nursing notes.  Pertinent labs & imaging results that were available during my care of the patient were reviewed by me and considered in my medical decision making (see chart for details).   Right ankle ulcer. Treating with cephalexin. Wound cleaned and dressed with Bactroban. Wound care instructions discussed with patient. Instructed him to schedule an appointment with his PCP in the next week for a wound recheck. Patient agrees to plan of care.   Final Clinical Impressions(s) / UC Diagnoses   Final diagnoses:  Lower limb ulcer, ankle, right, with unspecified severity [facility])     Discharge Instructions     Take the antibiotic as directed.    Keep your wound clean and dry.  Wash it gently twice a day with soap and water.  Apply an antibiotic cream and bandage twice a day.    Schedule an appointment with your primary care provider as soon as possible for a wound recheck.           ED Prescriptions    Medication Sig Dispense Auth. Provider   cephALEXin (KEFLEX) 500 MG capsule Take 1 capsule (500 mg total) by mouth 4 (four) times daily. 28 capsule Mickie Bail, NP     PDMP not reviewed this encounter.   Mickie Bail, NP 12/25/20 [phone]

## 2020-12-26 ENCOUNTER — Other Ambulatory Visit: Payer: Self-pay | Admitting: Internal Medicine

## 2020-12-26 MED ORDER — LABETALOL HCL 100 MG PO TABS: 100.0000 mg | ORAL_TABLET | Freq: Two times a day (BID) | ORAL | 0 refills | Status: DC

## 2020-12-26 MED FILL — LABETALOL HCL 100MG TABLET: 100 | 90 days supply | Qty: 180 | Fill #0

## 2021-01-05 ENCOUNTER — Telehealth: Payer: Self-pay

## 2021-01-05 ENCOUNTER — Other Ambulatory Visit: Payer: Self-pay | Admitting: Internal Medicine

## 2021-01-05 ENCOUNTER — Other Ambulatory Visit: Payer: Self-pay

## 2021-01-05 ENCOUNTER — Encounter: Payer: No Typology Code available for payment source | Admitting: Internal Medicine

## 2021-01-05 DIAGNOSIS — I482 Chronic atrial fibrillation, unspecified: Secondary | ICD-10-CM

## 2021-01-05 MED ORDER — DILTIAZEM HCL ER COATED BEADS 240 MG PO CP24: 240.0000 mg | ORAL_CAPSULE | Freq: Every day | ORAL | 0 refills | Status: DC

## 2021-01-05 MED FILL — DILTIAZEM 24HR ER 240 MG CA: 240 | 30 days supply | Qty: 30 | Fill #0

## 2021-01-05 NOTE — Telephone Encounter (Signed)
Pt n/s CPE today  Pt has canceled and no showed 5 appts since Sept 2021

## 2021-01-05 NOTE — Telephone Encounter (Signed)
Patient had to be resch for appt but is asking for refill on medication until next appt    diltiazem (CARDIZEM CD) 240 MG 24 hr capsule  [facility] [place], [place] - [address] [place]

## 2021-01-05 NOTE — Progress Notes (Deleted)
Subjective:    Patient ID: Thomas Quant., male    DOB: [birth year], 40 y.o.   MRN: [id number]  HPI    Review of Systems  Past Medical History:  Diagnosis Date  . Atrial fibrillation (HCC) 10/29/2010  . Benign essential hypertension 12/15/2008  . Cardiomyopathy 11/28/2010  . History of chicken pox   . Renal insufficiency 11/28/2010    Current Outpatient Medications  Medication Sig Dispense Refill  . allopurinol (ZYLOPRIM) 100 MG tablet TAKE 1 TABLET BY MOUTH DAILY 90 tablet 0  . cephALEXin (KEFLEX) 500 MG capsule Take 1 capsule (500 mg total) by mouth 4 (four) times daily. 28 capsule 0  . colchicine 0.6 MG tablet TAKE 1 TAB BY MOUTH AT ONSET OF FLARE THEN 1/2 TAB 1 HOUR LATER. MAY TAKE 1/2 TAB 2 TIMES A DAY AS NEEDED THEREAFTER DURING GOUT FLARE 90 tablet 0  . diltiazem (CARDIZEM CD) 240 MG 24 hr capsule TAKE 1 CAPSULE BY MOUTH DAILY 90 capsule 0  . hydrALAZINE (APRESOLINE) 10 MG tablet Take 1 tablet (10 mg total) by mouth in the morning and at bedtime. 60 tablet 11  . labetalol (NORMODYNE) 100 MG tablet Take 1 tablet (100 mg total) by mouth 2 (two) times daily. 180 tablet 0  . olmesartan (BENICAR) 40 MG tablet TAKE 1 TABLET BY MOUTH DAILY 90 tablet 0  . XARELTO 20 MG TABS tablet TAKE 1 TABLET BY MOUTH ONCE DAILY WITH SUPPER (MUST SCHEDULE FOLLOW UP VISIT FOR DECEMBER) 90 tablet 1   No current facility-administered medications for this visit.    No Known Allergies  Family History  Problem Relation Age of Onset  . Hypertension Mother   . Diabetes Mother   . Hypertension Father   . Glaucoma Father   . Hyperlipidemia Other        Parent    Social History   Socioeconomic History  . Marital status: Married    Spouse name: Not on file  . Number of children: Not on file  . Years of education: 47  . Highest education level: Not on file  Occupational History  . Occupation: Geophysicist/field seismologist: [employer]  Tobacco Use  . Smoking status:  Never Smoker  . Smokeless tobacco: Never Used  Vaping Use  . Vaping Use: Never used  Substance and Sexual Activity  . Alcohol use: Yes    Alcohol/week: 1.0 standard drink    Types: 1 Shots of liquor per week    Comment: rare  . Drug use: No  . Sexual activity: Yes    Birth control/protection: Condom  Other Topics Concern  . Not on file  Social History Narrative   Neither of his parents nor any siblings have any cardiac or heart rhythm issues. He has 2 grandparents that were on blood thinners but doesn't know why.      Regular exercise-no   Caffeine Use-yes   Social Determinants of Health   Financial Resource Strain: Not on file  Food Insecurity: Not on file  Transportation Needs: Not on file  Physical Activity: Not on file  Stress: Not on file  Social Connections: Not on file  Intimate Partner Violence: Not on file     Constitutional: Denies fever, malaise, fatigue, headache or abrupt weight changes.  HEENT: Denies eye pain, eye redness, ear pain, ringing in the ears, wax buildup, runny nose, nasal congestion, bloody nose, or sore throat. Respiratory: Denies difficulty breathing, shortness of breath, cough or sputum  production.   Cardiovascular: Denies chest pain, chest tightness, palpitations or swelling in the hands or feet.  Gastrointestinal: Denies abdominal pain, bloating, constipation, diarrhea or blood in the stool.  GU: Denies urgency, frequency, pain with urination, burning sensation, blood in urine, odor or discharge. Musculoskeletal: Denies decrease in range of motion, difficulty with gait, muscle pain or joint pain and swelling.  Skin: Denies redness, rashes, lesions or ulcercations.  Neurological: Denies dizziness, difficulty with memory, difficulty with speech or problems with balance and coordination.  Psych: Denies anxiety, depression, SI/HI.  No other specific complaints in a complete review of systems (except as listed in HPI above).     Objective:    Physical Exam  There were no vitals taken for this visit. Wt Readings from Last 3 Encounters:  09/11/20 (!) 313 lb (142 kg)  07/25/20 (!) 312 lb 6.4 oz (141.7 kg)  07/27/19 (!) 320 lb 9.6 oz (145.4 kg)    General: Appears their stated age, well developed, well nourished in NAD. Skin: Warm, dry and intact. No rashes, lesions or ulcerations noted. HEENT: Head: normal shape and size; Eyes: sclera white, no icterus, conjunctiva pink, PERRLA and EOMs intact; Ears: Tm's gray and intact, normal light reflex; Nose: mucosa pink and moist, septum midline; Throat/Mouth: Teeth present, mucosa pink and moist, no exudate, lesions or ulcerations noted.  Neck:  Neck supple, trachea midline. No masses, lumps or thyromegaly present.  Cardiovascular: Normal rate and rhythm. S1,S2 noted.  No murmur, rubs or gallops noted. No JVD or BLE edema. No carotid bruits noted. Pulmonary/Chest: Normal effort and positive vesicular breath sounds. No respiratory distress. No wheezes, rales or ronchi noted.  Abdomen: Soft and nontender. Normal bowel sounds. No distention or masses noted. Liver, spleen and kidneys non palpable. Musculoskeletal: Normal range of motion. No signs of joint swelling. No difficulty with gait.  Neurological: Alert and oriented. Cranial nerves II-XII grossly intact. Coordination normal.  Psychiatric: Mood and affect normal. Behavior is normal. Judgment and thought content normal.   EKG:  BMET    Component Value Date/Time   NA 139 07/28/2020 0944   K 4.5 07/28/2020 0944   CL 102 07/28/2020 0944   CO2 25 07/28/2020 0944   GLUCOSE 89 07/28/2020 0944   GLUCOSE 84 05/10/2019 1522   BUN 18 07/28/2020 0944   CREATININE 1.45 (H) 07/28/2020 0944   CREATININE 1.69 (H) 02/13/2017 1504   CALCIUM 9.5 07/28/2020 0944   GFRNONAA 61 07/28/2020 0944   GFRAA 70 07/28/2020 0944    Lipid Panel     Component Value Date/Time   CHOL 128 05/10/2019 1522   TRIG 74.0 05/10/2019 1522   HDL 42.00 05/10/2019  1522   CHOLHDL 3 05/10/2019 1522   VLDL 14.8 05/10/2019 1522   LDLCALC 71 05/10/2019 1522    CBC    Component Value Date/Time   WBC 3.5 07/28/2020 0944   WBC 3.7 (L) 05/10/2019 1522   RBC 5.01 07/28/2020 0944   RBC 4.94 05/10/2019 1522   HGB 14.5 07/28/2020 0944   HCT 42.8 07/28/2020 0944   PLT 189 07/28/2020 0944   MCV 85 07/28/2020 0944   MCH 28.9 07/28/2020 0944   MCH 30.0 02/13/2017 1504   MCHC 33.9 07/28/2020 0944   MCHC 32.4 05/10/2019 1522   RDW 13.2 07/28/2020 0944   LYMPHSABS 1.8 12/21/2013 1621   MONOABS 0.3 12/21/2013 1621   EOSABS 0.1 12/21/2013 1621   BASOSABS 0.0 12/21/2013 1621    Hgb A1C Lab Results  Component Value  Date   HGBA1C 5.7 05/10/2019           Assessment & Plan:    Nicki Reaper, NP This visit occurred during the SARS-CoV-2 public health emergency.  Safety protocols were in place, including screening questions prior to the visit, additional usage of staff PPE, and extensive cleaning of exam room while observing appropriate contact time as indicated for disinfecting solutions.

## 2021-01-05 NOTE — Telephone Encounter (Signed)
Pt should be dismissed for excessive no shows.

## 2021-01-08 ENCOUNTER — Telehealth: Payer: Self-pay | Admitting: Internal Medicine

## 2021-01-08 ENCOUNTER — Encounter: Payer: Self-pay | Admitting: Internal Medicine

## 2021-01-08 NOTE — Telephone Encounter (Signed)
Spoke with patient he was very kind and apologetic regarding the no shows and late cancellations.  He was informed of the dismissal as he has been warned numerous times in the past and a documented warning is in chart for March 2020 by then [employer], Larita Fife).  [place] for him. He did ask for the number to [facility] last Friday as that office is much closer and he feels that will be much easier for him to be compliant.   I do not feel that it is necessary to dismiss him from all [facility] as this is an understandable dilemma and he verbalizes understanding and compliance.  Feel it appropriate to give him another chance at a closer practice.  He was very polite and understanding on the phone.   He knows that we will provide him medications and care as needed for the next 30 days from today and that he needs to go ahead and connect with a new provider there as soon as possible.  FYI to PCP.

## 2021-01-08 NOTE — Telephone Encounter (Signed)
Please assist pt with scheduling and NPP.  Thanks

## 2021-01-08 NOTE — Telephone Encounter (Signed)
noted 

## 2021-01-08 NOTE — Progress Notes (Signed)
Erroneous encounter

## 2021-01-08 NOTE — Telephone Encounter (Signed)
See below

## 2021-01-08 NOTE — Telephone Encounter (Signed)
Fine with me

## 2021-01-08 NOTE — Telephone Encounter (Signed)
Pt called to say he was having trouble making it to appts at [facility] with Nicki Reaper and would like to transfer his care to the [facility] location as it is much closer for him. Ok to sch a TOC with you Dr Claiborne Billings?

## 2021-01-08 NOTE — Telephone Encounter (Signed)
Ok with me as long as his prior PCP approves.

## 2021-01-08 NOTE — Telephone Encounter (Signed)
Please advise if okay?

## 2021-01-09 NOTE — Telephone Encounter (Signed)
Lmom for patient to call office to schedule TOC appt with Dr. Kuneff 

## 2021-02-12 ENCOUNTER — Other Ambulatory Visit: Payer: Self-pay | Admitting: Internal Medicine

## 2021-02-12 DIAGNOSIS — I482 Chronic atrial fibrillation, unspecified: Secondary | ICD-10-CM

## 2021-02-13 ENCOUNTER — Other Ambulatory Visit: Payer: Self-pay | Admitting: Internal Medicine

## 2021-02-13 MED ORDER — ALLOPURINOL 100 MG PO TABS: 100.0000 mg | ORAL_TABLET | Freq: Every day | ORAL | 0 refills | Status: DC

## 2021-02-13 MED ORDER — DILTIAZEM HCL ER COATED BEADS 240 MG PO CP24: 240.0000 mg | ORAL_CAPSULE | Freq: Every day | ORAL | 0 refills | Status: DC

## 2021-02-13 MED ORDER — OLMESARTAN MEDOXOMIL 40 MG PO TABS: 40.0000 mg | ORAL_TABLET | Freq: Every day | ORAL | 0 refills | Status: DC

## 2021-02-20 ENCOUNTER — Ambulatory Visit (INDEPENDENT_AMBULATORY_CARE_PROVIDER_SITE_OTHER): Payer: No Typology Code available for payment source | Admitting: Cardiology

## 2021-02-20 ENCOUNTER — Encounter: Payer: Self-pay | Admitting: Cardiology

## 2021-02-20 ENCOUNTER — Other Ambulatory Visit: Payer: Self-pay | Admitting: Cardiology

## 2021-02-20 ENCOUNTER — Other Ambulatory Visit: Payer: Self-pay

## 2021-02-20 VITALS — BP 160/83 | HR 88 | Ht 73.0 in | Wt 325.8 lb

## 2021-02-20 DIAGNOSIS — I10 Essential (primary) hypertension: Secondary | ICD-10-CM

## 2021-02-20 DIAGNOSIS — I4891 Unspecified atrial fibrillation: Secondary | ICD-10-CM

## 2021-02-20 MED ORDER — HYDRALAZINE HCL 25 MG PO TABS: 25.0000 mg | ORAL_TABLET | Freq: Two times a day (BID) | ORAL | 6 refills | Status: DC

## 2021-02-20 MED FILL — HYDRALAZINE HCL 25 MG TABS: 25 | 30 days supply | Qty: 60 | Fill #0

## 2021-02-20 NOTE — Progress Notes (Signed)
Cardiology Office Note   Date:  02/20/2021   ID:  Thomas [name]., DOB [birth year], MRN [id number]  PCP:  Lorre Munroe, NP  Cardiologist:   Rollene Rotunda, MD  Referring:  Lorre Munroe, NP  Chief Complaint  Patient presents with  . Hypertension      History of Present Illness: Thomas [name]. is a 40 y.o. male who presents for follow up of atrial fib, HTN and a CVA. He has been treated with tPA in past for his CVA.  I last saw him in 2020.    Since I last saw him he was being evaluated for a job and was noted to be hypertensive.  The child requires that he is doing some physical activity and some testing and they were not can let him do this because his blood pressure systolic was in the 200 range.  He is actually been feeling okay but he has not been checking his blood pressure at home.  He said he had a home health nurse come to check him in the last few days and his blood pressure was controlled.   He denies any cardiovascular symptoms.  He really does not notice his chronic atrial fibrillation.  He tolerates anticoagulation.  He has had no new shortness of breath, PND or orthopnea.  He said no palpitations, presyncope or syncope.  Has had no weight gain or edema   Past Medical History:  Diagnosis Date  . Atrial fibrillation (HCC) 10/29/2010  . Benign essential hypertension 12/15/2008  . Cardiomyopathy 11/28/2010  . History of chicken pox   . Renal insufficiency 11/28/2010    History reviewed. No pertinent surgical history.   Current Outpatient Medications  Medication Sig Dispense Refill  . allopurinol (ZYLOPRIM) 100 MG tablet Take 1 tablet (100 mg total) by mouth daily. 90 tablet 0  . colchicine 0.6 MG tablet TAKE 1 TAB BY MOUTH AT ONSET OF FLARE THEN 1/2 TAB 1 HOUR LATER. MAY TAKE 1/2 TAB 2 TIMES A DAY AS NEEDED THEREAFTER DURING GOUT FLARE 90 tablet 0  . diltiazem (CARDIZEM CD) 240 MG 24 hr capsule Take 1 capsule (240 mg total) by mouth daily. 90 capsule 0   . labetalol (NORMODYNE) 100 MG tablet Take 1 tablet (100 mg total) by mouth 2 (two) times daily. 180 tablet 0  . olmesartan (BENICAR) 40 MG tablet Take 1 tablet (40 mg total) by mouth daily. 90 tablet 0  . XARELTO 20 MG TABS tablet TAKE 1 TABLET BY MOUTH ONCE DAILY WITH SUPPER (MUST SCHEDULE FOLLOW UP VISIT FOR DECEMBER) 90 tablet 1  . hydrALAZINE (APRESOLINE) 25 MG tablet Take 1 tablet (25 mg total) by mouth in the morning and at bedtime. 60 tablet 6   No current facility-administered medications for this visit.    Allergies:   Patient has no known allergies.    ROS:  Please see the history of present illness.   Otherwise, review of systems are positive for none.   All other systems are reviewed and negative.    PHYSICAL EXAM: VS:  BP (!) 160/83   Pulse 88   Ht 6\' 1"  (1.854 m)   Wt (!) 325 lb 12.8 oz (147.8 kg)   SpO2 99%   BMI 42.98 kg/m  , BMI Body mass index is 42.98 kg/m. GENERAL:  Well appearing NECK:  No jugular venous distention, waveform within normal limits, carotid upstroke brisk and symmetric, no bruits, no thyromegaly LUNGS:  Clear to auscultation  bilaterally CHEST:  Unremarkable HEART:  PMI not displaced or sustained,S1 and S2 within normal limits, no S3, no clicks, no rubs, no murmurs, irregular ABD:  Flat, positive bowel sounds normal in frequency in pitch, no bruits, no rebound, no guarding, no midline pulsatile mass, no hepatomegaly, no splenomegaly EXT:  2 plus pulses throughout, no edema, no cyanosis no clubbing   EKG:  EKG is not ordered today.   Recent Labs: 07/28/2020: BUN 18; Creatinine, Ser 1.45; Hemoglobin 14.5; Platelets 189; Potassium 4.5; Sodium 139    Lipid Panel    Component Value Date/Time   CHOL 128 05/10/2019 1522   TRIG 74.0 05/10/2019 1522   HDL 42.00 05/10/2019 1522   CHOLHDL 3 05/10/2019 1522   VLDL 14.8 05/10/2019 1522   LDLCALC 71 05/10/2019 1522      Wt Readings from Last 3 Encounters:  02/20/21 (!) 325 lb 12.8 oz (147.8  kg)  09/11/20 (!) 313 lb (142 kg)  07/25/20 (!) 312 lb 6.4 oz (141.7 kg)      Other studies Reviewed: Additional studies/ records that were reviewed today include: None Review of the above records demonstrates:  NA    ASSESSMENT AND PLAN:  ATRIAL FIB:  [name] has a [name]2DS2 - VASc score of 3.  He tolerates anticoagulation and has had good rate control.  He has been in this rhythm since at least 2014 when he was followed by another cardiologist.   He is in permanent fibrillation.   CVA:    He has some mild residual left arm hand weakness.  No change in therapy.  NON ISCHEMIC CARDIOMYOPATHY: His last ejection fraction was 55 to 60%.  I would not suspect this to be lower.  No change in therapy.   HTN:    His blood pressure is not controlled.  I am going to increase his hydralazine to 25 mg twice daily.  More importantly we talked about significant therapeutic lifestyle changes as below.  He will keep a blood pressure diary.    OBESITY:     The patient was given specific instructions about a lower carbohydrate diet and we discussed specific plan.  I also prescribed 150 minutes of physical activity weekly.  I gave him a goal weight loss of 3 pounds per month.   Current medicines are reviewed at length with the patient today.  The patient does not have concerns regarding medicines.  The following changes have been made:   As above  Labs/ tests ordered today include: None  No orders of the defined types were placed in this encounter.    Disposition:   FU with me 3 months Signed, Rollene Rotunda, MD  02/20/2021 5:28 PM    [facility]

## 2021-02-20 NOTE — Patient Instructions (Signed)
Medication Instructions:  INCREASE HYDRALAZINE 25MG  TWICE DAILY *If you need a refill on your cardiac medications before your next appointment, please call your pharmacy*  Lab Work:   Testing/Procedures:  NONE    NONE  Special Instructions TAKE AND LOG YOUR BLOOD PRESSURE-SEND BP LOG FOR REVIEW-VIA MYCHART  Follow-Up: Your next appointment:  3 month(s) In Person with [place], MD   At [facility], you and your health needs are our priority.  As part of our continuing mission to provide you with exceptional heart care, we have created designated Provider Care Teams.  These Care Teams include your primary Cardiologist (physician) and Advanced Practice Providers (APPs -  Physician Assistants and Nurse Practitioners) who

## 2021-03-06 ENCOUNTER — Other Ambulatory Visit (HOSPITAL_COMMUNITY): Payer: Self-pay

## 2021-03-06 MED ORDER — LABETALOL HCL 100 MG PO TABS
100.0000 mg | ORAL_TABLET | Freq: Three times a day (TID) | ORAL | 0 refills | Status: DC
  Filled 2021-03-06: qty 180, 60d supply, fill #0

## 2021-03-15 ENCOUNTER — Encounter: Payer: No Typology Code available for payment source | Admitting: Internal Medicine

## 2021-03-15 ENCOUNTER — Other Ambulatory Visit (HOSPITAL_COMMUNITY): Payer: Self-pay

## 2021-03-15 ENCOUNTER — Telehealth: Payer: Self-pay | Admitting: Cardiology

## 2021-03-15 MED ORDER — LABETALOL HCL 200 MG PO TABS
200.0000 mg | ORAL_TABLET | Freq: Three times a day (TID) | ORAL | 0 refills | Status: DC
  Filled 2021-03-15: qty 90, 30d supply, fill #0
  Filled 2021-04-25: qty 90, 30d supply, fill #1

## 2021-03-15 MED FILL — Diltiazem HCl Coated Beads Cap ER 24HR 240 MG: ORAL | 30 days supply | Qty: 30 | Fill #0 | Status: AC

## 2021-03-15 MED FILL — Olmesartan Medoxomil Tab 40 MG: ORAL | 30 days supply | Qty: 30 | Fill #0 | Status: AC

## 2021-03-15 NOTE — Telephone Encounter (Signed)
Pt updated and verbalized understanding. New order placed 

## 2021-03-15 NOTE — Telephone Encounter (Signed)
Spoke with pt. He report despite starting labetoalol he BP remains elevated (BP listed below). Pt also report occasional headaches but denies symptoms currently.   181/106 194/111 Average range: 170-190's   Will forward to pharm D for recommendations.

## 2021-03-15 NOTE — Telephone Encounter (Signed)
Labetalol was started (as recommended) at low dose.   Okay to increase dose to 200mg  three times daily, please keep record of BP and heart rate for future titration , if needed.   May need increase to labetalol 300mg  TID if blood pressure remains above goal and heart rate stable above 60.

## 2021-03-15 NOTE — Telephone Encounter (Signed)
Pt c/o medication issue:  1. Name of Medication: labetalol (NORMODYNE) 100 MG tablet  2. How are you currently taking this medication (dosage and times per day)? As prescribed  3. Are you having a reaction (difficulty breathing--STAT)? No  4. What is your medication issue? PT is calling with concerns about this medication.He states there is no change happening with his bp.He would like to know if it can be rewritten to a different medication to help this issue.Please advise

## 2021-03-16 ENCOUNTER — Other Ambulatory Visit (HOSPITAL_COMMUNITY): Payer: Self-pay

## 2021-03-16 MED ORDER — HYDRALAZINE HCL 50 MG PO TABS
50.0000 mg | ORAL_TABLET | Freq: Three times a day (TID) | ORAL | 3 refills | Status: DC
  Filled 2021-03-16: qty 90, 30d supply, fill #0

## 2021-03-16 NOTE — Addendum Note (Signed)
Addended by: Lindell Spar on: 03/16/2021 09:45 AM   Modules accepted: Orders

## 2021-03-24 NOTE — Progress Notes (Signed)
Cardiology Office Note   Date:  03/26/2021   ID:  Thomas Quant., DOB 1981-02-01, MRN [id number]  PCP:  Natalia Leatherwood, DO  Cardiologist:   Rollene Rotunda, MD    Chief Complaint  Patient presents with  . Hypertension      History of Present Illness: Thomas Hudgins. is a 40 y.o. male who presents for follow up of atrial fib, HTN and a CVA. He has been treated with tPA in past for his CVA.  I last saw him in 2020.    He has had uncontrolled hypertension.   At the last visit I increased his hydralazine and over the phone we increased his labetalol.  He returns for follow up of this.  He has felt his blood pressure being elevated and he feels jumpy when it is up that he has felt a little bit better with increased beta-blocker although his numbers have not really been better.  He is having no new shortness of breath, PND or orthopnea.  He is having no chest pressure, neck or arm discomfort.  He is having no weight gain or edema.   Past Medical History:  Diagnosis Date  . Atrial fibrillation (HCC) 10/29/2010  . Benign essential hypertension 12/15/2008  . Cardiomyopathy 11/28/2010  . History of chicken pox   . Renal insufficiency 11/28/2010    History reviewed. No pertinent surgical history.   Current Outpatient Medications  Medication Sig Dispense Refill  . allopurinol (ZYLOPRIM) 100 MG tablet TAKE 1 TABLET BY MOUTH ONCE A DAY 90 tablet 0  . cloNIDine (CATAPRES - DOSED IN MG/24 HR) 0.2 mg/24hr patch Place 1 patch (0.2 mg total) onto the skin once a week. 4 patch 12  . colchicine 0.6 MG tablet TAKE 1 TABLET BY MOUTH AT ONSET OF FLARE THEN 1/2 TAB 1 HOUR LATER. MAY TAKE 1/2 TAB 2 TIMES A DAY AS NEEDED THEREAFTER DURING GOUT FLARE 90 tablet 0  . diltiazem (CARDIZEM CD) 240 MG 24 hr capsule TAKE 1 CAPSULE BY MOUTH ONCE A DAY 90 capsule 0  . labetalol (NORMODYNE) 200 MG tablet Take 1 tablet (200 mg total) by mouth 3 (three) times daily. 180 tablet 0  . olmesartan (BENICAR)  40 MG tablet TAKE 1 TABLET BY MOUTH ONCE A DAY 90 tablet 0  . XARELTO 20 MG TABS tablet TAKE 1 TABLET BY MOUTH ONCE DAILY WITH SUPPER (MUST SCHEDULE FOLLOW UP VISIT FOR DECEMBER) 90 tablet 1  . hydrALAZINE (APRESOLINE) 50 MG tablet Take 1.5 tablets (75 mg total) by mouth 3 (three) times daily. 405 tablet 3   No current facility-administered medications for this visit.    Allergies:   Patient has no known allergies.    ROS:  Please see the history of present illness.   Otherwise, review of systems are positive for none.   All other systems are reviewed and negative.    PHYSICAL EXAM: VS:  BP (!) 178/119   Ht 6\' 1"  (1.854 m)   Wt (!) 323 lb 6.4 oz (146.7 kg)   BMI 42.67 kg/m  , BMI Body mass index is 42.67 kg/m. GENERAL:  Well appearing NECK:  No jugular venous distention, waveform within normal limits, carotid upstroke brisk and symmetric, no bruits, no thyromegaly LUNGS:  Clear to auscultation bilaterally CHEST:  Unremarkable HEART:  PMI not displaced or sustained,S1 and S2 within normal limits, no S3, no clicks, no rubs, no murmurs, irregular  ABD:  Flat, positive bowel sounds normal in  frequency in pitch, no bruits, no rebound, no guarding, no midline pulsatile mass, no hepatomegaly, no splenomegaly EXT:  2 plus pulses throughout, no edema, no cyanosis no clubbing   EKG:  EKG is not ordered today.   Recent Labs: 07/28/2020: BUN 18; Creatinine, Ser 1.45; Hemoglobin 14.5; Platelets 189; Potassium 4.5; Sodium 139    Lipid Panel    Component Value Date/Time   CHOL 128 05/10/2019 1522   TRIG 74.0 05/10/2019 1522   HDL 42.00 05/10/2019 1522   CHOLHDL 3 05/10/2019 1522   VLDL 14.8 05/10/2019 1522   LDLCALC 71 05/10/2019 1522      Wt Readings from Last 3 Encounters:  03/26/21 (!) 323 lb 6.4 oz (146.7 kg)  02/20/21 (!) 325 lb 12.8 oz (147.8 kg)  09/11/20 (!) 313 lb (142 kg)      Other studies Reviewed: Additional studies/ records that were reviewed today include:  None Review of the above records demonstrates:  NA   ASSESSMENT AND PLAN:  ATRIAL FIB:  [name] has a [name]2DS2 - VASc score of 3.    He tolerates anticoagulation and has had reasonable rate control.  CVA:    He has mild residual left arm weakness.  No change in therapy.  NON ISCHEMIC CARDIOMYOPATHY: His last ejection fraction was 55 to 60%.  No further imaging at this time.  HTN:    His blood pressure is still not at target.  I am going to increase his hydralazine to 75 mg 3 times daily and add clonidine patch #2 weekly.  He has not tolerated the clonidine pills in the past.   He has had a negative sleep study years ago I might repeat this in particular.   I will be checking blood work to include BMET,  TSH and CBC.  OBESITY:      We discussed this and he is going to the gym and we talked about diet specifically.  Current medicines are reviewed at length with the patient today.  The patient does not have concerns regarding medicines.  The following changes have been made:   See above  Labs/ tests ordered today include:    Orders Placed This Encounter  Procedures  . Basic metabolic panel  . CBC  . TSH     Disposition:   FU with me or APP in one week.  Signed, Rollene Rotunda, MD  03/26/2021 10:31 AM    [facility]

## 2021-03-26 ENCOUNTER — Other Ambulatory Visit (HOSPITAL_COMMUNITY): Payer: Self-pay

## 2021-03-26 ENCOUNTER — Encounter: Payer: Self-pay | Admitting: Cardiology

## 2021-03-26 ENCOUNTER — Other Ambulatory Visit: Payer: Self-pay

## 2021-03-26 ENCOUNTER — Ambulatory Visit (INDEPENDENT_AMBULATORY_CARE_PROVIDER_SITE_OTHER): Payer: No Typology Code available for payment source | Admitting: Cardiology

## 2021-03-26 VITALS — BP 178/119 | Ht 73.0 in | Wt 323.4 lb

## 2021-03-26 DIAGNOSIS — R5383 Other fatigue: Secondary | ICD-10-CM | POA: Diagnosis not present

## 2021-03-26 DIAGNOSIS — I10 Essential (primary) hypertension: Secondary | ICD-10-CM | POA: Diagnosis not present

## 2021-03-26 DIAGNOSIS — I482 Chronic atrial fibrillation, unspecified: Secondary | ICD-10-CM | POA: Diagnosis not present

## 2021-03-26 LAB — CBC
Hematocrit: 43.8 % (ref 37.5–51.0)
Hemoglobin: 14.1 g/dL (ref 13.0–17.7)
MCH: 29.1 pg (ref 26.6–33.0)
MCHC: 32.2 g/dL (ref 31.5–35.7)
MCV: 90 fL (ref 79–97)
Platelets: 147 10*3/uL — ABNORMAL LOW (ref 150–450)
RBC: 4.85 x10E6/uL (ref 4.14–5.80)
RDW: 14 % (ref 11.6–15.4)
WBC: 3.3 10*3/uL — ABNORMAL LOW (ref 3.4–10.8)

## 2021-03-26 LAB — BASIC METABOLIC PANEL
BUN/Creatinine Ratio: 13 (ref 9–20)
BUN: 22 mg/dL — ABNORMAL HIGH (ref 6–20)
CO2: 24 mmol/L (ref 20–29)
Calcium: 9.5 mg/dL (ref 8.7–10.2)
Chloride: 104 mmol/L (ref 96–106)
Creatinine, Ser: 1.72 mg/dL — ABNORMAL HIGH (ref 0.76–1.27)
Glucose: 95 mg/dL (ref 65–99)
Potassium: 5.2 mmol/L (ref 3.5–5.2)
Sodium: 141 mmol/L (ref 134–144)
eGFR: 51 mL/min/{1.73_m2} — ABNORMAL LOW (ref >59)

## 2021-03-26 LAB — TSH: TSH: 1.47 u[IU]/mL (ref 0.450–4.500)

## 2021-03-26 MED ORDER — CLONIDINE 0.2 MG/24HR TD PTWK
0.2000 mg | MEDICATED_PATCH | TRANSDERMAL | 12 refills | Status: DC
  Filled 2021-03-26: qty 4, 28d supply, fill #0
  Filled 2021-04-25: qty 4, 28d supply, fill #1
  Filled 2021-05-28: qty 4, 28d supply, fill #2
  Filled 2021-06-10: qty 4, 28d supply, fill #3

## 2021-03-26 MED ORDER — HYDRALAZINE HCL 50 MG PO TABS
75.0000 mg | ORAL_TABLET | Freq: Three times a day (TID) | ORAL | 3 refills | Status: DC
  Filled 2021-03-26: qty 405, 90d supply, fill #0
  Filled 2021-04-13: qty 135, 30d supply, fill #0
  Filled 2021-05-16: qty 135, 30d supply, fill #1
  Filled 2021-06-18: qty 135, 30d supply, fill #2

## 2021-03-26 NOTE — Patient Instructions (Signed)
Medication Instructions:   INCREASE Hydralazine to 75 mg (1.5 tablet) 3 times a day  START Clonidine 0.2 mg patch once a week  *If you need a refill on your cardiac medications before your next appointment, please call your pharmacy*  Lab Work:  Your physician recommends that you return for lab work TODAY:   BMET  CBC  TSH  If you have labs (blood work) drawn today and your tests are completely normal, you will receive your results only by: Marland Kitchen MyChart Message (if you have MyChart) OR . A paper copy in the mail If you have any lab test that is abnormal or we need to change your treatment, we will call you to review the results.  Testing/Procedures: NONE ordered at this time of appointment   Follow-Up: At [facility], you and your health needs are our priority.  As part of our continuing mission to provide you with exceptional heart care, we have created designated Provider Care Teams.  These Care Teams include your primary Cardiologist (physician) and Advanced Practice Providers (APPs -  Physician Assistants and Nurse Practitioners) who all work together to provide you with the care you need, when you need it.  Your next appointment:   1 week(s)  The format for your next appointment:   In Person  Provider:   You may see Rollene Rotunda, MD or one of the following Advanced Practice Providers on your designated Care Team:    Theodore Demark, PA-C  Joni Reining, DNP, ANP   Other Instructions

## 2021-03-27 ENCOUNTER — Other Ambulatory Visit (HOSPITAL_COMMUNITY): Payer: Self-pay

## 2021-04-04 DIAGNOSIS — I6930 Unspecified sequelae of cerebral infarction: Secondary | ICD-10-CM | POA: Insufficient documentation

## 2021-04-04 DIAGNOSIS — M25562 Pain in left knee: Secondary | ICD-10-CM

## 2021-04-04 DIAGNOSIS — M25561 Pain in right knee: Secondary | ICD-10-CM

## 2021-04-04 HISTORY — DX: Pain in right knee: M25.561

## 2021-04-04 HISTORY — DX: Pain in right knee: M25.562

## 2021-04-05 NOTE — Progress Notes (Signed)
Cardiology Office Note   Date:  04/06/2021   ID:  Thomas Quant., DOB [birth year], MRN [id number]  PCP:  Natalia Leatherwood, DO  Cardiologist:   Rollene Rotunda, MD    Chief Complaint  Patient presents with  . Hypertension      History of Present Illness: Thomas [name]. is a 40 y.o. male who presents for follow up of atrial fib, HTN and a CVA. He has been treated with tPA in past for his CVA.  I last saw him in 2020.    He has had uncontrolled hypertension.    At the last visit I increase his hydralazine and added a Catapres patch.  His blood pressure is improving.  He is now in the 160s over 90s routinely which is much better than before.  He tolerates that clonidine patch.  He is feeling better and exercising.  He is slowly losing a little bit of weight.  He is really avoiding salt.  He denies any palpitations, presyncope or syncope.  He has had no new shortness of breath, PND or orthopnea.  He has had no weight gain or edema.  Past Medical History:  Diagnosis Date  . Atrial fibrillation (HCC) 10/29/2010  . Benign essential hypertension 12/15/2008  . Cardiomyopathy 11/28/2010  . History of chicken pox   . Renal insufficiency 11/28/2010    History reviewed. No pertinent surgical history.   Current Outpatient Medications  Medication Sig Dispense Refill  . allopurinol (ZYLOPRIM) 100 MG tablet TAKE 1 TABLET BY MOUTH ONCE A DAY 90 tablet 0  . cloNIDine (CATAPRES - DOSED IN MG/24 HR) 0.2 mg/24hr patch Place 1 patch (0.2 mg total) onto the skin once a week. 4 patch 12  . colchicine 0.6 MG tablet TAKE 1 TABLET BY MOUTH AT ONSET OF FLARE THEN 1/2 TAB 1 HOUR LATER. MAY TAKE 1/2 TAB 2 TIMES A DAY AS NEEDED THEREAFTER DURING GOUT FLARE 90 tablet 0  . diltiazem (CARDIZEM CD) 240 MG 24 hr capsule TAKE 1 CAPSULE BY MOUTH ONCE A DAY 90 capsule 0  . hydrALAZINE (APRESOLINE) 50 MG tablet Take 1.5 tablets (75 mg total) by mouth 3 (three) times daily. 405 tablet 3  . labetalol (NORMODYNE)  200 MG tablet Take 1 tablet (200 mg total) by mouth 3 (three) times daily. 180 tablet 0  . olmesartan (BENICAR) 40 MG tablet TAKE 1 TABLET BY MOUTH ONCE A DAY 90 tablet 0  . XARELTO 20 MG TABS tablet TAKE 1 TABLET BY MOUTH ONCE DAILY WITH SUPPER (MUST SCHEDULE FOLLOW UP VISIT FOR DECEMBER) 90 tablet 1   No current facility-administered medications for this visit.    Allergies:   Patient has no known allergies.    ROS:  Please see the history of present illness.   Otherwise, review of systems are positive for none.   All other systems are reviewed and negative.    PHYSICAL EXAM: VS:  BP (!) 168/110   Pulse 77   Ht 6\' 1"  (1.854 m)   Wt (!) 320 lb 3.2 oz (145.2 kg)   SpO2 97%   BMI 42.25 kg/m  , BMI Body mass index is 42.25 kg/m.  GENERAL:  Well appearing NECK:  No jugular venous distention, waveform within normal limits, carotid upstroke brisk and symmetric, no bruits, no thyromegaly LUNGS:  Clear to auscultation bilaterally CHEST:  Unremarkable HEART:  PMI not displaced or sustained,S1 and S2 within normal limits, no S3,  no clicks, no rubs, no  murmurs, irregular ABD:  Flat, positive bowel sounds normal in frequency in pitch, no bruits, no rebound, no guarding, no midline pulsatile mass, no hepatomegaly, no splenomegaly EXT:  2 plus pulses throughout, no edema, no cyanosis no clubbing   EKG:  EKG is not ordered today. NA  Recent Labs: 03/26/2021: BUN 22; Creatinine, Ser 1.72; Hemoglobin 14.1; Platelets 147; Potassium 5.2; Sodium 141; TSH 1.470    Lipid Panel    Component Value Date/Time   CHOL 128 05/10/2019 1522   TRIG 74.0 05/10/2019 1522   HDL 42.00 05/10/2019 1522   CHOLHDL 3 05/10/2019 1522   VLDL 14.8 05/10/2019 1522   LDLCALC 71 05/10/2019 1522      Wt Readings from Last 3 Encounters:  04/06/21 (!) 320 lb 3.2 oz (145.2 kg)  03/26/21 (!) 323 lb 6.4 oz (146.7 kg)  02/20/21 (!) 325 lb 12.8 oz (147.8 kg)      Other studies Reviewed: Additional studies/  records that were reviewed today include: None Review of the above records demonstrates:  NA   ASSESSMENT AND PLAN:  ATRIAL FIB:  [name] has a [name]2DS2 - VASc score of 3.    He tolerates anticoagulation.  No change in therapy.  CVA:    He has some residual mild left arm weakness.  No change in therapy.  LVH : His last ejection fraction was 55 to 60%.  This was said to be severe previously but mild at the last evaluation.  I will follow this clinically and with repeat echoes in the future.   HTN:   I do think he is doing better with the clonidine patch and neck steps would be a #3 if his blood pressure plateaus where it is but for now he is going to concentrate on losing weight and watching his salt and hopefully his blood pressure continues to slowly come down.  He will otherwise remain on the meds as listed.  OBESITY:    I am proud of his efforts at weight loss.  Current medicines are reviewed at length with the patient today.  The patient does not have concerns regarding medicines.  The following changes have been made:   None  Labs/ tests ordered today include: None  No orders of the defined types were placed in this encounter.    Disposition:   FU with me 4 months Signed, Rollene Rotunda, MD  04/06/2021 4:27 PM    [facility]

## 2021-04-06 ENCOUNTER — Encounter: Payer: Self-pay | Admitting: Cardiology

## 2021-04-06 ENCOUNTER — Ambulatory Visit: Payer: No Typology Code available for payment source | Admitting: Cardiology

## 2021-04-06 ENCOUNTER — Ambulatory Visit (INDEPENDENT_AMBULATORY_CARE_PROVIDER_SITE_OTHER): Payer: No Typology Code available for payment source | Admitting: Cardiology

## 2021-04-06 ENCOUNTER — Other Ambulatory Visit: Payer: Self-pay

## 2021-04-06 VITALS — BP 168/110 | HR 77 | Ht 73.0 in | Wt 320.2 lb

## 2021-04-06 DIAGNOSIS — I10 Essential (primary) hypertension: Secondary | ICD-10-CM

## 2021-04-06 NOTE — Patient Instructions (Signed)
Medication Instructions:  Continue same medications *If you need a refill on your cardiac medications before your next appointment, please call your pharmacy*   Lab Work: None ordered   Testing/Procedures: None ordered   Follow-Up: At [facility], you and your health needs are our priority.  As part of our continuing mission to provide you with exceptional heart care, we have created designated Provider Care Teams.  These Care Teams include your primary Cardiologist (physician) and Advanced Practice Providers (APPs -  Physician Assistants and Nurse Practitioners) who all work together to provide you with the care you need, when you need it.  We recommend signing up for the patient portal called "MyChart".  Sign up information is provided on this After Visit Summary.  MyChart is used to connect with patients for Virtual Visits (Telemedicine).  Patients are able to view lab/test results, encounter notes, upcoming appointments, etc.  Non-urgent messages can be sent to your provider as well.   To learn more about what you can do with MyChart, go to [email or web address].    Your next appointment:  4 months   The format for your next appointment: Office     Provider:  Dr.Hochrein

## 2021-04-09 ENCOUNTER — Encounter: Payer: Self-pay | Admitting: Family Medicine

## 2021-04-09 ENCOUNTER — Other Ambulatory Visit: Payer: Self-pay

## 2021-04-09 ENCOUNTER — Ambulatory Visit (INDEPENDENT_AMBULATORY_CARE_PROVIDER_SITE_OTHER): Payer: No Typology Code available for payment source | Admitting: Family Medicine

## 2021-04-09 ENCOUNTER — Other Ambulatory Visit (HOSPITAL_COMMUNITY): Payer: Self-pay

## 2021-04-09 VITALS — BP 181/93 | HR 71 | Temp 98.0°F | Ht 73.0 in | Wt 318.6 lb

## 2021-04-09 DIAGNOSIS — I4891 Unspecified atrial fibrillation: Secondary | ICD-10-CM | POA: Diagnosis not present

## 2021-04-09 DIAGNOSIS — N183 Chronic kidney disease, stage 3 unspecified: Secondary | ICD-10-CM | POA: Diagnosis not present

## 2021-04-09 DIAGNOSIS — I69359 Hemiplegia and hemiparesis following cerebral infarction affecting unspecified side: Secondary | ICD-10-CM

## 2021-04-09 DIAGNOSIS — Z0001 Encounter for general adult medical examination with abnormal findings: Secondary | ICD-10-CM

## 2021-04-09 DIAGNOSIS — I10 Essential (primary) hypertension: Secondary | ICD-10-CM | POA: Diagnosis not present

## 2021-04-09 DIAGNOSIS — I6930 Unspecified sequelae of cerebral infarction: Secondary | ICD-10-CM

## 2021-04-09 DIAGNOSIS — Z131 Encounter for screening for diabetes mellitus: Secondary | ICD-10-CM | POA: Diagnosis not present

## 2021-04-09 DIAGNOSIS — Z1159 Encounter for screening for other viral diseases: Secondary | ICD-10-CM

## 2021-04-09 DIAGNOSIS — Z7689 Persons encountering health services in other specified circumstances: Secondary | ICD-10-CM

## 2021-04-09 DIAGNOSIS — Z23 Encounter for immunization: Secondary | ICD-10-CM | POA: Diagnosis not present

## 2021-04-09 DIAGNOSIS — I482 Chronic atrial fibrillation, unspecified: Secondary | ICD-10-CM

## 2021-04-09 MED ORDER — RIVAROXABAN 20 MG PO TABS
20.0000 mg | ORAL_TABLET | Freq: Every day | ORAL | 3 refills | Status: DC
  Filled 2021-04-09: qty 30, 30d supply, fill #0

## 2021-04-09 MED ORDER — OLMESARTAN MEDOXOMIL 40 MG PO TABS
ORAL_TABLET | Freq: Every day | ORAL | 1 refills | Status: DC
  Filled 2021-04-09: qty 30, 30d supply, fill #0
  Filled 2021-05-16: qty 30, 30d supply, fill #1
  Filled 2021-06-15: qty 30, 30d supply, fill #2

## 2021-04-09 MED ORDER — DILTIAZEM HCL ER COATED BEADS 240 MG PO CP24
ORAL_CAPSULE | Freq: Every day | ORAL | 1 refills | Status: DC
  Filled 2021-04-09: qty 30, 30d supply, fill #0
  Filled 2021-05-16: qty 30, 30d supply, fill #1
  Filled 2021-06-15: qty 30, 30d supply, fill #2

## 2021-04-09 NOTE — Progress Notes (Signed)
Patient ID: Thomas [name]., male  DOB: 10/09/1981, 40 y.o.   MRN: [id number] Patient Care Team    Relationship Specialty Notifications Start End  Natalia Leatherwood, DO PCP - General Family Medicine  03/26/21   Rollene Rotunda, MD Consulting Physician Cardiology  04/09/21     Chief Complaint  Patient presents with  . Annual Exam  . Establish Care    [facility]    Subjective: Thomas [name]. is a 40 y.o.  male present for new patient establishment/TOC- has not seen his pcp since 2020. All past medical history, surgical history, allergies, family history, immunizations, medications and social history were updated in the electronic medical record today. All recent labs, ED visits and hospitalizations within the last year were reviewed.  Health maintenance:  Colonoscopy: no fhx. Routine screen at 45 Immunizations: tdap updated today, Influenza UTD 2021 (encouraged yearly), covid series x3 Infectious disease screening: HIV completed, Hep C agreeable to testing today PSA: No results found for: PSA- routine screen at 45 Assistive device: none Oxygen TKP:TWSF Patient has a Dental home. Hospitalizations/ED visits:reviewed  Stage 3 chronic kidney disease, unspecified whether stage 3a or 3b CKD (HCC) Reviewed last GFR is 51 with a creatinine of 1.72.  Hemiplegia affecting dominant side, post-stroke (HCC)/History of cerebrovascular accident with residual deficit Stroke 2014, does not seem to have followed with neurology during this time.  Recently reestablished with cardiology.  He is prescribed Xarelto and reports compliance.  He is currently not on a statin.  Chronic atrial fibrillation (HCC)/Morbid obesity (HCC)/ HTN (hypertension), malignant/Atrial fibrillation with RVR (HCC) Pt reports compliance with Benicar 40 mg daily, diltiazem 240 mg daily, labetalol 200 mg 3 times daily, hydralazine 75 mg 3 times daily, clonidine patch 0.2 mg weekly. Blood pressures ranges at home "all over the  place, but improved ". Patient denies chest pain, shortness of breath or lower extremity edema. Pt is on chronic anticoagulation with Xarelto 20 mg daily.  Patient is not prescribed statin  echo 02/28/2017: EF 55-60% RF: Hypertension, obesity, CVA, cardiomyopathy, CKD, A. fib  Echocardiogram 02/28/2017: Study Conclusions  - Left ventricle: The cavity size was normal. Wall thickness was  increased in a pattern of mild LVH. Systolic function was normal.  The estimated ejection fraction was in the range of 55% to 60%.  Wall motion was normal; there were no regional wall motion  abnormalities. The study is not technically sufficient to allow  evaluation of LV diastolic function.  - Mitral valve: There was trivial regurgitation.  - Left atrium: The atrium was mildly dilated. Volume/bsa, ES,  (1-plane Simpson&'s, A2C): 40.2 ml/m^2.  - Right ventricle: The cavity size was mildly dilated. Wall  thickness was normal.    Depression screen [facility] 2/9 04/09/2021 05/10/2019 02/23/2019 10/13/2017 05/21/2016  Decreased Interest 0 0 0 0 0  Down, Depressed, Hopeless 0 0 0 0 0  PHQ - 2 Score 0 0 0 0 0  Altered sleeping - 0 - - -  Tired, decreased energy - 0 - - -  Change in appetite - 0 - - -  Feeling bad or failure about yourself  - 0 - - -  Trouble concentrating - 0 - - -  Moving slowly or fidgety/restless - 0 - - -  Suicidal thoughts - 0 - - -  PHQ-9 Score - 0 - - -  Difficult doing work/chores - Not difficult at all - - -   No flowsheet data found.  Fall Risk  10/13/2017 05/21/2016 02/22/2015 12/28/2012  Falls in the past year? No No No No   Immunization History  Administered Date(s) Administered  . Influenza Split 09/06/2016  . Influenza Whole 10/29/2010  . Influenza,inj,Quad PF,6+ Mos 09/03/2015, 09/06/2016, 09/22/2018  . Influenza-Unspecified 11/01/2012, 09/05/2017, 09/21/2019, 09/07/2020  . PFIZER(Purple Top)SARS-COV-2 Vaccination 08/11/2020, 09/01/2020, 03/24/2021  . Tdap  12/02/2008, 04/09/2021    No exam data present  Past Medical History:  Diagnosis Date  . Atrial fibrillation (HCC) 10/29/2010  . Benign essential hypertension 12/15/2008  . Bilateral knee pain 04/04/2021  . Cardiomyopathy 11/28/2010  . CVA (cerebral vascular accident) (HCC)    right CVA/hemiplegia  . Gout   . History of chicken pox   . Renal insufficiency 11/28/2010   No Known Allergies History reviewed. No pertinent surgical history. Family History  Problem Relation Age of Onset  . Hypertension Mother   . Diabetes Mother   . Hypertension Father   . Glaucoma Father   . Alcohol abuse Father   . Hearing loss Father   . Hyperlipidemia Other        Parent   Social History   Social History Narrative   Neither of his parents nor any siblings have any cardiac or heart rhythm issues. He has 2 grandparents that were on blood thinners but doesn't know why.      Regular exercise-no   Caffeine Use-yes    Allergies as of 04/09/2021   No Known Allergies     Medication List       Accurate as of Apr 09, 2021  5:24 PM. If you have any questions, ask your nurse or doctor.        STOP taking these medications   allopurinol 100 MG tablet Commonly known as: ZYLOPRIM Stopped by: Felix Pacini, DO   colchicine 0.6 MG tablet Stopped by: Felix Pacini, DO     TAKE these medications   cloNIDine 0.2 mg/24hr patch Commonly known as: CATAPRES - Dosed in mg/24 hr Place 1 patch (0.2 mg total) onto the skin once a week.   diltiazem 240 MG 24 hr capsule Commonly known as: CARDIZEM CD TAKE 1 CAPSULE BY MOUTH ONCE A DAY   hydrALAZINE 50 MG tablet Commonly known as: APRESOLINE Take 1.5 tablets (75 mg total) by mouth 3 (three) times daily.   labetalol 200 MG tablet Commonly known as: NORMODYNE Take 1 tablet (200 mg total) by mouth 3 (three) times daily.   olmesartan 40 MG tablet Commonly known as: BENICAR TAKE 1 TABLET BY MOUTH ONCE A DAY   rivaroxaban 20 MG Tabs tablet Commonly  known as: XARELTO Take 1 tablet (20 mg total) by mouth daily with supper. What changed:   how much to take  how to take this  when to take this Changed by: Felix Pacini, DO       All past medical history, surgical history, allergies, family history, immunizations andmedications were updated in the EMR today and reviewed under the history and medication portions of their EMR.     No results found.   ROS: 14 pt review of systems performed and negative (unless mentioned in an HPI)  Objective: BP (!) 181/93 (BP Location: Right Arm, Patient Position: Sitting, Cuff Size: Large)   Pulse 71   Temp 98 F (36.7 C) (Oral)   Ht 6\' 1"  (1.854 m)   Wt (!) 318 lb 9.6 oz (144.5 kg)   SpO2 98%   BMI 42.03 kg/m  Gen: Afebrile. No acute distress. Nontoxic  in appearance, well-developed, well-nourished, pleasant, obese male HENT: AT. [place]. Bilateral TM visualized and normal in appearance, normal external auditory canal. MMM, no oral lesions, adequate dentition. Bilateral nares within normal limits. Throat without erythema, ulcerations or exudates.  No cough on exam, no hoarseness on exam. Eyes:Pupils Equal Round Reactive to light, Extraocular movements intact,  Conjunctiva without redness, discharge or icterus. Neck/lymp/endocrine: Supple, no lymphadenopathy, no thyromegaly CV: RRR no murmur, no edema, +2/4 P posterior tibialis pulses.  Chest: CTAB, no wheeze, rhonchi or crackles.  Normal respiratory effort.  Good air movement. Abd: Soft.  Obese. NTND. BS present.  No masses palpated. No hepatosplenomegaly. No rebound tenderness or guarding. Skin: No rashes, purpura or petechiae. Warm and well-perfused. Skin intact. Neuro/Msk: Normal gait. PERLA. EOMi. Alert. Oriented x3.  Cranial nerves II through XII intact. Muscle strength 5/5 upper/lower extremity. DTRs equal bilaterally. Psych: Normal affect, dress and demeanor. Normal speech. Normal thought content and judgment.   Assessment/plan: Djon Tith. is a 40 y.o. male present for TOC/CPE/CMC Stage 3 chronic kidney disease, unspecified whether stage 3a or 3b CKD (HCC) Avoid nephrotoxic agents. Renally dose meds when appropriate.   Hemiplegia affecting dominant side, post-stroke (HCC)/History of cerebrovascular accident with residual deficit/chronic anticoagulation Patient with a history of stroke not on statin.  Would likely benefit from at least low-dose statin for cardiovascular protection. Lipids collected today. Continue Xarelto daily.  Chronic atrial fibrillation (HCC)/Morbid obesity (HCC)/ HTN (hypertension), malignant/Atrial fibrillation with RVR (HCC)/chronic anticoagulation Blood pressure still significantly high today.  Encouraged him to call his cardiologist to discuss further, closer follow-up warranted if blood pressures remain elevated. Continue Benicar 40 mg daily Continue diltiazem 240 mg daily> could consider increase. Continue Xarelto 20 mg daily Continue labetalol 200 mg 3 times daily, hydralazine 75 mg 3 times daily, clonidine 0.2 mg weekly patch> prescribed by cardiology. Intolerant to diuretics with out increase in uric acid.  - Lipid panel -Reviewed all recent lab work collected April 2022, CBC, CMP and TSH. - pt had RAS work up 12/23/2012- without complete visualization left> may be worth revisiting. - tsh normal.   - renin/aldosterone repeat - last 2010 - metanephrines appear mildly elevated 12/2008? Consider further eval. - Pt reports 2 sleep studies since 2014 have been normal.   Diabetes mellitus screening - Hemoglobin A1c Need for hepatitis C screening test - Hepatitis C Antibody Need for Tdap vaccination - Tdap vaccine greater than or equal to 7yo IM  Encounter for general adult medical examination with abnormal findings Colonoscopy: no fhx. Routine screen at 45 Immunizations: tdap updated today, Influenza UTD 2021 (encouraged yearly), covid series x3 Infectious disease screening: HIV  completed, Hep C agreeable to testing today PSA: No results found for: PSA- routine screen at 45 Patient was encouraged to exercise greater than 150 minutes a week. Patient was encouraged to choose a diet filled with fresh fruits and vegetables, and lean meats. AVS provided to patient today for education/recommendation on gender specific health and safety maintenance. Return in about 25 weeks (around 10/01/2021) for [facility] (30 min).  Orders Placed This Encounter  Procedures  . Tdap vaccine greater than or equal to 7yo IM  . Lipid panel  . Hemoglobin A1c  . Hepatitis C Antibody   Meds ordered this encounter  Medications  . olmesartan (BENICAR) 40 MG tablet    Sig: TAKE 1 TABLET BY MOUTH ONCE A DAY    Dispense:  90 tablet    Refill:  1  . diltiazem (CARDIZEM CD)  240 MG 24 hr capsule    Sig: TAKE 1 CAPSULE BY MOUTH ONCE A DAY    Dispense:  90 capsule    Refill:  1  . rivaroxaban (XARELTO) 20 MG TABS tablet    Sig: Take 1 tablet (20 mg total) by mouth daily with supper.    Dispense:  90 tablet    Refill:  3   Referral Orders  No referral(s) requested today     Note is dictated utilizing voice recognition software. Although note has been proof read prior to signing, occasional typographical errors still can be missed. If any questions arise, please do not hesitate to call for verification.  Electronically signed by: Felix Pacini, DO [facility] [place]

## 2021-04-09 NOTE — Patient Instructions (Signed)
Great to see you today.  I have refilled the medication(s) we provide.   If labs were collected, we will inform you of lab results once received either by echart message or telephone call.   - echart message- for normal results that have been seen by the patient already.   - telephone call: abnormal results or if patient has not viewed results in their echart.    Health Maintenance, Male Adopting a healthy lifestyle and getting preventive care are important in promoting health and wellness. Ask your health care provider about:  The right schedule for you to have regular tests and exams.  Things you can do on your own to prevent diseases and keep yourself healthy. What should I know about diet, weight, and exercise? Eat a healthy diet  Eat a diet that includes plenty of vegetables, fruits, low-fat dairy products, and lean protein.  Do not eat a lot of foods that are high in solid fats, added sugars, or sodium.   Maintain a healthy weight Body mass index (BMI) is a measurement that can be used to identify possible weight problems. It estimates body fat based on height and weight. Your health care provider can help determine your BMI and help you achieve or maintain a healthy weight. Get regular exercise Get regular exercise. This is one of the most important things you can do for your health. Most adults should:  Exercise for at least 150 minutes each week. The exercise should increase your heart rate and make you sweat (moderate-intensity exercise).  Do strengthening exercises at least twice a week. This is in addition to the moderate-intensity exercise.  Spend less time sitting. Even light physical activity can be beneficial. Watch cholesterol and blood lipids Have your blood tested for lipids and cholesterol at 40 years of age, then have this test every 5 years. You may need to have your cholesterol levels checked more often if:  Your lipid or cholesterol levels are high.  You  are older than 40 years of age.  You are at high risk for heart disease. What should I know about cancer screening? Many types of cancers can be detected early and may often be prevented. Depending on your health history and family history, you may need to have cancer screening at various ages. This may include screening for:  Colorectal cancer.  Prostate cancer.  Skin cancer.  Lung cancer. What should I know about heart disease, diabetes, and high blood pressure? Blood pressure and heart disease  High blood pressure causes heart disease and increases the risk of stroke. This is more likely to develop in people who have high blood pressure readings, are of African descent, or are overweight.  Talk with your health care provider about your target blood pressure readings.  Have your blood pressure checked: ? Every 3-5 years if you are 18-39 years of age. ? Every year if you are 40 years old or older.  If you are between the ages of 65 and 75 and are a current or former smoker, ask your health care provider if you should have a one-time screening for abdominal aortic aneurysm (AAA). Diabetes Have regular diabetes screenings. This checks your fasting blood sugar level. Have the screening done:  Once every three years after age 45 if you are at a normal weight and have a low risk for diabetes.  More often and at a younger age if you are overweight or have a high risk for diabetes. What should I know about   preventing infection? Hepatitis B If you have a higher risk for hepatitis B, you should be screened for this virus. Talk with your health care provider to find out if you are at risk for hepatitis B infection. Hepatitis C Blood testing is recommended for:  Everyone born from 1945 through 1965.  Anyone with known risk factors for hepatitis C. Sexually transmitted infections (STIs)  You should be screened each year for STIs, including gonorrhea and chlamydia, if: ? You are  sexually active and are younger than 40 years of age. ? You are older than 40 years of age and your health care provider tells you that you are at risk for this type of infection. ? Your sexual activity has changed since you were last screened, and you are at increased risk for chlamydia or gonorrhea. Ask your health care provider if you are at risk.  Ask your health care provider about whether you are at high risk for HIV. Your health care provider may recommend a prescription medicine to help prevent HIV infection. If you choose to take medicine to prevent HIV, you should first get tested for HIV. You should then be tested every 3 months for as long as you are taking the medicine. Follow these instructions at home: Lifestyle  Do not use any products that contain nicotine or tobacco, such as cigarettes, e-cigarettes, and chewing tobacco. If you need help quitting, ask your health care provider.  Do not use street drugs.  Do not share needles.  Ask your health care provider for help if you need support or information about quitting drugs. Alcohol use  Do not drink alcohol if your health care provider tells you not to drink.  If you drink alcohol: ? Limit how much you have to 0-2 drinks a day. ? Be aware of how much alcohol is in your drink. In the [place] one drink equals one 12 oz bottle of beer (355 mL), one 5 oz glass of wine (148 mL), or one 1 oz glass of hard liquor (44 mL). General instructions  Schedule regular health, dental, and eye exams.  Stay current with your vaccines.  Tell your health care provider if: ? You often feel depressed. ? You have ever been abused or do not feel safe at home. Summary  Adopting a healthy lifestyle and getting preventive care are important in promoting health and wellness.  Follow your health care provider's instructions about healthy diet, exercising, and getting tested or screened for diseases.  Follow your health care provider's  instructions on monitoring your cholesterol and blood pressure. This information is not intended to replace advice given to you by your health care provider. Make sure you discuss any questions you have with your health care provider. Document Revised: 11/11/2018 Document Reviewed: 11/11/2018 Elsevier Patient Education  2021 Elsevier Inc.  

## 2021-04-10 ENCOUNTER — Other Ambulatory Visit (HOSPITAL_COMMUNITY): Payer: Self-pay

## 2021-04-10 ENCOUNTER — Telehealth: Payer: Self-pay | Admitting: Family Medicine

## 2021-04-10 LAB — HEPATITIS C ANTIBODY
Hepatitis C Ab: NONREACTIVE
SIGNAL TO CUT-OFF: 0.01 (ref <1.00)

## 2021-04-10 LAB — LIPID PANEL
Cholesterol: 150 mg/dL (ref <200)
HDL: 49 mg/dL (ref >=40)
LDL Cholesterol (Calc): 82 mg/dL (calc)
Non-HDL Cholesterol (Calc): 101 mg/dL (calc) (ref <130)
Total CHOL/HDL Ratio: 3.1 (calc) (ref <5.0)
Triglycerides: 91 mg/dL (ref <150)

## 2021-04-10 LAB — HEMOGLOBIN A1C
Hgb A1c MFr Bld: 5.4 % of total Hgb (ref <5.7)
Mean Plasma Glucose: 108 mg/dL
eAG (mmol/L): 6 mmol/L

## 2021-04-10 MED ORDER — ROSUVASTATIN CALCIUM 10 MG PO TABS
ORAL_TABLET | ORAL | 3 refills | Status: DC
  Filled 2021-04-10: qty 12, 28d supply, fill #0
  Filled 2021-05-09: qty 12, 28d supply, fill #1
  Filled 2021-06-10: qty 12, 28d supply, fill #2
  Filled 2021-07-03: qty 12, 28d supply, fill #3

## 2021-04-10 NOTE — Telephone Encounter (Signed)
LM for pt to return call regarding results.  

## 2021-04-10 NOTE — Telephone Encounter (Signed)
Please inform patient: - His A1c/diabetes screen looks great at 5.4 is normal. - His cholesterol panel overall looks really good.  However with his history of stroke and resistant hypertension, his LDL goal would be less than 70.  It is currently 52.  Adding a medication called statin, even just 3 days a week on Monday Wednesday Friday we will provide him with extra cardiovascular protection and also help lower his LDL into goal.    Hep C screen is pending, we will notify him of results once received  Instead of a 6-month follow-up he originally discussed, I encouraged him to make a 55-month follow-up with this provider.  Fasting if able so that we can recheck his cholesterol, liver/kidney function and blood pressure.

## 2021-04-10 NOTE — Telephone Encounter (Signed)
Spoke with patient regarding results/recommendations.  

## 2021-04-13 ENCOUNTER — Other Ambulatory Visit (HOSPITAL_COMMUNITY): Payer: Self-pay

## 2021-04-26 ENCOUNTER — Other Ambulatory Visit (HOSPITAL_COMMUNITY): Payer: Self-pay

## 2021-04-27 ENCOUNTER — Other Ambulatory Visit (HOSPITAL_COMMUNITY): Payer: Self-pay

## 2021-04-28 ENCOUNTER — Other Ambulatory Visit (HOSPITAL_COMMUNITY): Payer: Self-pay

## 2021-05-09 ENCOUNTER — Other Ambulatory Visit (HOSPITAL_COMMUNITY): Payer: Self-pay

## 2021-05-16 ENCOUNTER — Other Ambulatory Visit (HOSPITAL_COMMUNITY): Payer: Self-pay

## 2021-05-29 ENCOUNTER — Other Ambulatory Visit (HOSPITAL_COMMUNITY): Payer: Self-pay

## 2021-05-30 ENCOUNTER — Other Ambulatory Visit (HOSPITAL_COMMUNITY): Payer: Self-pay

## 2021-05-30 ENCOUNTER — Other Ambulatory Visit: Payer: Self-pay | Admitting: Cardiology

## 2021-05-30 MED ORDER — LABETALOL HCL 200 MG PO TABS
200.0000 mg | ORAL_TABLET | Freq: Three times a day (TID) | ORAL | 3 refills | Status: DC
  Filled 2021-05-30: qty 90, 30d supply, fill #0
  Filled 2021-07-03: qty 90, 30d supply, fill #1

## 2021-06-11 ENCOUNTER — Other Ambulatory Visit (HOSPITAL_COMMUNITY): Payer: Self-pay

## 2021-06-12 ENCOUNTER — Other Ambulatory Visit (HOSPITAL_COMMUNITY): Payer: Self-pay

## 2021-06-15 ENCOUNTER — Other Ambulatory Visit (HOSPITAL_COMMUNITY): Payer: Self-pay

## 2021-06-19 ENCOUNTER — Other Ambulatory Visit (HOSPITAL_COMMUNITY): Payer: Self-pay

## 2021-07-03 ENCOUNTER — Other Ambulatory Visit (HOSPITAL_COMMUNITY): Payer: Self-pay

## 2021-07-06 ENCOUNTER — Telehealth: Payer: Self-pay | Admitting: Family Medicine

## 2021-07-06 NOTE — Telephone Encounter (Signed)
awaiting

## 2021-07-06 NOTE — Telephone Encounter (Signed)
Patient dropped off Medical form for Dr. Claiborne Billings to fill out. Placed in Dr. Alan Ripper front office inbox to be filled out and returned.

## 2021-07-09 NOTE — Telephone Encounter (Signed)
Placed on PCP desk for review. Pt CPE was 04/2021

## 2021-07-11 NOTE — Telephone Encounter (Signed)
Completed form and placed in [facility] work basket. ?

## 2021-07-13 NOTE — Telephone Encounter (Signed)
Patient stopped by office to see if form he dropped off last week was completed/signed by Dr. Claiborne Billings.  Form was completed, gave to patient, sent copy to scan.

## 2021-07-16 ENCOUNTER — Other Ambulatory Visit (HOSPITAL_COMMUNITY): Payer: Self-pay

## 2021-07-16 ENCOUNTER — Ambulatory Visit: Payer: No Typology Code available for payment source | Admitting: Family Medicine

## 2021-07-16 ENCOUNTER — Encounter: Payer: Self-pay | Admitting: Family Medicine

## 2021-07-27 ENCOUNTER — Ambulatory Visit (INDEPENDENT_AMBULATORY_CARE_PROVIDER_SITE_OTHER): Payer: No Typology Code available for payment source | Admitting: Family Medicine

## 2021-07-27 ENCOUNTER — Other Ambulatory Visit: Payer: Self-pay

## 2021-07-27 ENCOUNTER — Telehealth: Payer: Self-pay | Admitting: Family Medicine

## 2021-07-27 ENCOUNTER — Encounter: Payer: Self-pay | Admitting: Family Medicine

## 2021-07-27 VITALS — BP 157/92 | HR 73 | Temp 98.7°F | Ht 73.0 in | Wt 325.0 lb

## 2021-07-27 DIAGNOSIS — M1A00X0 Idiopathic chronic gout, unspecified site, without tophus (tophi): Secondary | ICD-10-CM

## 2021-07-27 DIAGNOSIS — I482 Chronic atrial fibrillation, unspecified: Secondary | ICD-10-CM

## 2021-07-27 DIAGNOSIS — I6930 Unspecified sequelae of cerebral infarction: Secondary | ICD-10-CM

## 2021-07-27 DIAGNOSIS — I4891 Unspecified atrial fibrillation: Secondary | ICD-10-CM | POA: Diagnosis not present

## 2021-07-27 DIAGNOSIS — I69359 Hemiplegia and hemiparesis following cerebral infarction affecting unspecified side: Secondary | ICD-10-CM

## 2021-07-27 DIAGNOSIS — N1831 Chronic kidney disease, stage 3a: Secondary | ICD-10-CM

## 2021-07-27 DIAGNOSIS — I10 Essential (primary) hypertension: Secondary | ICD-10-CM

## 2021-07-27 DIAGNOSIS — D6869 Other thrombophilia: Secondary | ICD-10-CM | POA: Insufficient documentation

## 2021-07-27 DIAGNOSIS — Z79899 Other long term (current) drug therapy: Secondary | ICD-10-CM | POA: Diagnosis not present

## 2021-07-27 DIAGNOSIS — Z6841 Body Mass Index (BMI) 40.0 and over, adult: Secondary | ICD-10-CM | POA: Insufficient documentation

## 2021-07-27 DIAGNOSIS — M109 Gout, unspecified: Secondary | ICD-10-CM | POA: Diagnosis not present

## 2021-07-27 LAB — LIPID PANEL
Cholesterol: 104 mg/dL (ref 0–200)
HDL: 48 mg/dL (ref >39.00)
LDL Cholesterol: 45 mg/dL (ref 0–99)
NonHDL: 56.2
Total CHOL/HDL Ratio: 2
Triglycerides: 55 mg/dL (ref 0.0–149.0)
VLDL: 11 mg/dL (ref 0.0–40.0)

## 2021-07-27 LAB — HEPATIC FUNCTION PANEL
ALT: 23 U/L (ref 0–53)
AST: 27 U/L (ref 0–37)
Albumin: 4.3 g/dL (ref 3.5–5.2)
Alkaline Phosphatase: 39 U/L (ref 39–117)
Bilirubin, Direct: 0.2 mg/dL (ref 0.0–0.3)
Total Bilirubin: 0.7 mg/dL (ref 0.2–1.2)
Total Protein: 7.2 g/dL (ref 6.0–8.3)

## 2021-07-27 LAB — URIC ACID: Uric Acid, Serum: 7.6 mg/dL (ref 4.0–7.8)

## 2021-07-27 MED ORDER — ROSUVASTATIN CALCIUM 10 MG PO TABS: ORAL_TABLET | ORAL | 3 refills | Status: DC

## 2021-07-27 MED ORDER — HYDRALAZINE HCL 100 MG PO TABS: 100.0000 mg | ORAL_TABLET | Freq: Three times a day (TID) | ORAL | 1 refills | Status: DC

## 2021-07-27 MED ORDER — ALLOPURINOL 100 MG PO TABS: 100.0000 mg | ORAL_TABLET | Freq: Every day | ORAL | 3 refills | Status: DC

## 2021-07-27 MED ORDER — OLMESARTAN MEDOXOMIL 40 MG PO TABS: ORAL_TABLET | Freq: Every day | ORAL | 1 refills | Status: DC

## 2021-07-27 MED ORDER — DILTIAZEM HCL ER COATED BEADS 360 MG PO CP24: 360.0000 mg | ORAL_CAPSULE | Freq: Every day | ORAL | 1 refills | Status: DC

## 2021-07-27 NOTE — Telephone Encounter (Signed)
Spoke with pt regarding labs and instructions.   

## 2021-07-27 NOTE — Progress Notes (Signed)
Patient ID: [name], male  DOB: 28-Feb-1981, 40 y.o.   MRN: [id number] Patient Care Team    Relationship Specialty Notifications Start End  Natalia Leatherwood, DO PCP - General Family Medicine  03/26/21   Rollene Rotunda, MD Consulting Physician Cardiology  04/09/21     Chief Complaint  Patient presents with   Hyperlipidemia    CMC; pt is not fasting      Subjective: Thomas [name]. is a 40 y.o.  male present for [facility] Stage 3 chronic kidney disease, unspecified whether stage 3a or 3b CKD (HCC) Reviewed last GFR is 33 with a creatinine of 1.72.  Hemiplegia affecting dominant side, post-stroke (HCC)/History of cerebrovascular accident with residual deficit Stroke 2014, does not seem to have followed with neurology during this time.  Established with cardiology.  He is prescribed Xarelto and reports compliance.  He has started crestor and tolerating without side effects.  He reports compliance with dilt 240 mg qd, hydralazine 75 mg tid, benicar 40 mg qd, labetalol 200 mg tid and clonidine patch. He was unable to tolerate spiro (breast tenderness) and HCTZ (gout) in the past.   Chronic atrial fibrillation (HCC)/Morbid obesity (HCC)/ HTN (hypertension), malignant/Atrial fibrillation with RVR (HCC) Pt reports compliance with Benicar 40 mg daily, diltiazem 240 mg daily, labetalol 200 mg 3 times daily, hydralazine 75 mg 3 times daily, clonidine patch 0.2 mg weekly. Blood pressures ranges at home are still elevated . Patient denies chest pain, shortness of breath, dizziness or lower extremity edema.   Pt is on chronic anticoagulation with Xarelto 20 mg daily.  Patient is prescribed statin  echo 02/28/2017: EF 55-60% RF: Hypertension, obesity, CVA, cardiomyopathy, CKD, A. fib  Echocardiogram 02/28/2017: Study Conclusions  - Left ventricle: The cavity size was normal. Wall thickness was    increased in a pattern of mild LVH. Systolic function was normal.    The estimated ejection  fraction was in the range of 55% to 60%.    Wall motion was normal; there were no regional wall motion    abnormalities. The study is not technically sufficient to allow    evaluation of LV diastolic function.  - Mitral valve: There was trivial regurgitation.  - Left atrium: The atrium was mildly dilated. Volume/bsa, ES,    (1-plane Simpson&'s, A2C): 40.2 ml/m^2.  - Right ventricle: The cavity size was mildly dilated. Wall    thickness was normal.   Gout: He has a h/o gout. He has been on allopurinol in the past, but stopped taking. He is not able to take any diuretics for his uncontrolled BP bc gout flares.   Depression screen [facility] 2/9 04/09/2021 05/10/2019 02/23/2019 10/13/2017 05/21/2016  Decreased Interest 0 0 0 0 0  Down, Depressed, Hopeless 0 0 0 0 0  PHQ - 2 Score 0 0 0 0 0  Altered sleeping - 0 - - -  Tired, decreased energy - 0 - - -  Change in appetite - 0 - - -  Feeling bad or failure about yourself  - 0 - - -  Trouble concentrating - 0 - - -  Moving slowly or fidgety/restless - 0 - - -  Suicidal thoughts - 0 - - -  PHQ-9 Score - 0 - - -  Difficult doing work/chores - Not difficult at all - - -   No flowsheet data found.      Fall Risk  10/13/2017 05/21/2016 02/22/2015 12/28/2012  Falls in the past  year? No No No No   Immunization History  Administered Date(s) Administered   Influenza Split 09/06/2016   Influenza Whole 10/29/2010   Influenza,inj,Quad PF,6+ Mos 09/03/2015, 09/06/2016, 09/22/2018   Influenza-Unspecified 11/01/2012, 09/05/2017, 09/21/2019, 09/07/2020   PFIZER(Purple Top)SARS-COV-2 Vaccination 08/11/2020, 09/01/2020, 03/24/2021   Tdap 12/02/2008, 04/09/2021    No results found.  Past Medical History:  Diagnosis Date   Atrial fibrillation (HCC) 10/29/2010   Benign essential hypertension 12/15/2008   Bilateral knee pain 04/04/2021   Cardiomyopathy 11/28/2010   CVA (cerebral vascular accident) [facility])    right CVA/hemiplegia   Gout    History of chicken  pox    Renal insufficiency 11/28/2010   No Known Allergies History reviewed. No pertinent surgical history. Family History  Problem Relation Age of Onset   Hypertension Mother    Diabetes Mother    Hypertension Father    Glaucoma Father    Alcohol abuse Father    Hearing loss Father    Hyperlipidemia Other        Parent   Social History   Social History Narrative   Neither of his parents nor any siblings have any cardiac or heart rhythm issues. He has 2 grandparents that were on blood thinners but doesn't know why.      Regular exercise-no   Caffeine Use-yes    Allergies as of 07/27/2021   No Known Allergies      Medication List        Accurate as of July 27, 2021  4:13 PM. If you have any questions, ask your nurse or doctor.          cloNIDine 0.2 mg/24hr patch Commonly known as: CATAPRES - Dosed in mg/24 hr Place 1 patch (0.2 mg total) onto the skin once a week.   diltiazem 360 MG 24 hr capsule Commonly known as: Cardizem CD Take 1 capsule (360 mg total) by mouth daily. What changed:  medication strength how much to take Changed by: Felix Pacini, DO   hydrALAZINE 100 MG tablet Commonly known as: APRESOLINE Take 1 tablet (100 mg total) by mouth 3 (three) times daily. What changed:  medication strength how much to take Changed by: Felix Pacini, DO   labetalol 200 MG tablet Commonly known as: NORMODYNE Take 1 tablet (200 mg total) by mouth 3 (three) times daily.   olmesartan 40 MG tablet Commonly known as: BENICAR TAKE 1 TABLET BY MOUTH ONCE A DAY   rivaroxaban 20 MG Tabs tablet Commonly known as: XARELTO Take 1 tablet (20 mg total) by mouth daily with supper.   rosuvastatin 10 MG tablet Commonly known as: Crestor Take 1 tablet by mouth in the evening on Monday, Wednesday and Friday as directed        All past medical history, surgical history, allergies, family history, immunizations andmedications were updated in the EMR today and  reviewed under the history and medication portions of their EMR.     No results found.   ROS: 14 pt review of systems performed and negative (unless mentioned in an HPI)  Objective: BP (!) 157/92 (BP Location: Right Arm)   Pulse 73   Temp 98.7 F (37.1 C) (Oral)   Ht 6\' 1"  (1.854 m)   Wt (!) 325 lb (147.4 kg)   SpO2 98%   BMI 42.88 kg/m  Gen: Afebrile. No acute distress. Very pleasant morbidly obese male.  HENT: AT. [place].  Eyes:Pupils Equal Round Reactive to light, Extraocular movements intact,  Conjunctiva without redness, discharge or  icterus. Neck/lymp/endocrine: Supple,no lymphadenopathy, no thyromegaly CV: RRR no murmur, no edema, +2/4 P posterior tibialis pulses Chest: CTAB, no wheeze or crackles Skin: no rashes, purpura or petechiae.  Neuro:  Normal gait. PERLA. EOMi. Alert. Oriented. x3 Psych: Normal affect, dress and demeanor. Normal speech. Normal thought content and judgment.   Assessment/plan: Thomas [name]. is a 40 y.o. male present for  [facility] Stage 3 chronic kidney disease, unspecified whether stage 3a or 3b CKD (HCC) Avoid nephrotoxic agents. Renally dose meds when appropriate.  Hemiplegia affecting dominant side, post-stroke (HCC)/History of cerebrovascular accident with residual deficit/chronic anticoagulation Patient with a history of stroke not on statin.  Would likely benefit from at least low-dose statin for cardiovascular protection. Continue clonidine 0.2 mg weekly patch-prescribed by cardiology.  Continue labetalol 200 mg 3 times daily-prescribed by cardiology Continue Benicar 40 mg daily Continue hydralazine at increased dose 75 mg to 100 mg 3 times daily Continue diltiazem at increased dose 240 mg to 360 mg daily. Continue Xarelto 20 mg daily Continue Crestor 10 mg daily Lipids and LFTs collected today  Chronic atrial fibrillation (HCC)/Morbid obesity (HCC)/ HTN (hypertension), malignant/Atrial fibrillation with RVR (HCC)/acquired  thrombophilia/morbid obesity Blood pressure still significantly high today.  Encouraged him to call his cardiologist to discuss further, closer follow-up warranted if blood pressures remain elevated. Continue clonidine 0.2 mg weekly patch-prescribed by cardiology.  Continue labetalol 200 mg 3 times daily-prescribed by cardiology Continue Benicar 40 mg daily Continue hydralazine at increased dose 75 mg to 100 mg 3 times daily Continue diltiazem at increased dose 240 mg to 360 mg daily. Continue Xarelto 20 mg daily Continue Crestor 10 mg daily Lipids and LFTs collected today -Reviewed all recent lab work collected April 2022, CBC, CMP and TSH. - pt had RAS work up 12/23/2012- without complete visualization left> may be worth revisiting. - tsh normal.   - renin/aldosterone repeat - last 2010 - metanephrines appear mildly elevated 12/2008? Consider further eval. - Pt reports 2 sleep studies since 2014 have been normal.   Gout:  Uric acid levels collected today.  If in normal range we will start allopurinol daily.  Hopefully this will allow for diuretic use in the future if needed.  Return in about 3 months (around 10/27/2021) for CMC (30 min).  Orders Placed This Encounter  Procedures   Hepatic function panel   Lipid panel   Uric acid    Meds ordered this encounter  Medications   olmesartan (BENICAR) 40 MG tablet    Sig: TAKE 1 TABLET BY MOUTH ONCE A DAY    Dispense:  90 tablet    Refill:  1   diltiazem (CARDIZEM CD) 360 MG 24 hr capsule    Sig: Take 1 capsule (360 mg total) by mouth daily.    Dispense:  90 capsule    Refill:  1   hydrALAZINE (APRESOLINE) 100 MG tablet    Sig: Take 1 tablet (100 mg total) by mouth 3 (three) times daily.    Dispense:  270 tablet    Refill:  1   rosuvastatin (CRESTOR) 10 MG tablet    Sig: Take 1 tablet by mouth in the evening on Monday, Wednesday and Friday as directed    Dispense:  40 tablet    Refill:  3    Referral Orders  No referral(s)  requested today     Note is dictated utilizing voice recognition software. Although note has been proof read prior to signing, occasional typographical errors still can be  missed. If any questions arise, please do not hesitate to call for verification.  Electronically signed by: Howard Pouch, DO [place]

## 2021-07-27 NOTE — Telephone Encounter (Signed)
Please call patient: His cholesterol looks amazing now.  His total cholesterol is 104, HDL 48 and his bad cholesterol/LDL is 45. Uric acid levels are in the high end of normal range.  Start allopurinol 100 mg daily.  Medication called in for him.

## 2021-07-27 NOTE — Patient Instructions (Addendum)
Increase Diltiazem to 360 mg a day> new script  Increase hydralazine to 100 mg every 8 hours> use the script you have for now and new script will be ONE tab every 8 hours.

## 2021-08-13 DIAGNOSIS — I119 Hypertensive heart disease without heart failure: Secondary | ICD-10-CM | POA: Insufficient documentation

## 2021-08-13 NOTE — Progress Notes (Signed)
Cardiology Office Note   Date:  08/14/2021   ID:  Thomas Quant., DOB Dec 02, 1981, MRN [id number]  PCP:  Thomas Leatherwood, DO  Cardiologist:   Thomas Rotunda, MD    Chief Complaint  Patient presents with   Atrial Fibrillation       History of Present Illness: Thomas Beaulieu. is a 40 y.o. male who presents for follow up of atrial fib, HTN and a CVA. He has been treated with tPA in past for his CVA.  Marland Kitchen    He has had uncontrolled hypertension.    His blood pressure was better controlled compared to previous but still in the 160/100 range.  He switched jobs and had some difficulties getting exercise and with all of this transition but he now going to resume that.  His weight is up a little bit where he has been starting to trend down.  He feels well. The patient denies any new symptoms such as chest discomfort, neck or arm discomfort. There has been no new shortness of breath, PND or orthopnea. There have been no reported palpitations, presyncope or syncope.  He does not notice his atrial fibrillation.   Past Medical History:  Diagnosis Date   Atrial fibrillation (HCC) 10/29/2010   Benign essential hypertension 12/15/2008   Bilateral knee pain 04/04/2021   Cardiomyopathy 11/28/2010   CVA (ce[facility] vascular accident) [facility])    right CVA/hemiplegia   Gout    History of chicken pox    Renal insufficiency 11/28/2010    History reviewed. No pertinent surgical history.   Current Outpatient Medications  Medication Sig Dispense Refill   allopurinol (ZYLOPRIM) 100 MG tablet Take 1 tablet (100 mg total) by mouth daily. 90 tablet 3   diltiazem (CARDIZEM CD) 360 MG 24 hr capsule Take 1 capsule (360 mg total) by mouth daily. 90 capsule 1   hydrALAZINE (APRESOLINE) 100 MG tablet Take 1 tablet (100 mg total) by mouth 3 (three) times daily. 270 tablet 1   labetalol (NORMODYNE) 200 MG tablet Take 1 tablet (200 mg total) by mouth 3 (three) times daily. 252 tablet 3   olmesartan (BENICAR)  40 MG tablet TAKE 1 TABLET BY MOUTH ONCE A DAY 90 tablet 1   rivaroxaban (XARELTO) 20 MG TABS tablet Take 1 tablet (20 mg total) by mouth daily with supper. 90 tablet 3   rosuvastatin (CRESTOR) 10 MG tablet Take 1 tablet by mouth in the evening on Monday, Wednesday and Friday as directed 40 tablet 3   cloNIDine (CATAPRES - DOSED IN MG/24 HR) 0.3 mg/24hr patch Place 1 patch (0.3 mg total) onto the skin once a week. 90 patch 1   No current facility-administered medications for this visit.    Allergies:   Patient has no known allergies.    ROS:  Please see the history of present illness.   Otherwise, review of systems are positive for none.   All other systems are reviewed and negative.    PHYSICAL EXAM: VS:  BP (!) 160/110   Pulse 71   Ht 6\' 1"  (1.854 m)   Wt (!) 328 lb 6.4 oz (149 kg)   SpO2 97%   BMI 43.33 kg/m  , BMI Body mass index is 43.33 kg/m.  GENERAL:  Well appearing NECK:  No jugular venous distention, waveform within normal limits, carotid upstroke brisk and symmetric, no bruits, no thyromegaly LUNGS:  Clear to auscultation bilaterally CHEST:  Unremarkable HEART:  PMI not displaced or sustained,S1 and  S2 within normal limits, no S3, no clicks, no rubs, no murmurs, irregular  ABD:  Flat, positive bowel sounds normal in frequency in pitch, no bruits, no rebound, no guarding, no midline pulsatile mass, no hepatomegaly, no splenomegaly EXT:  2 plus pulses throughout, mild bilateral leg edema, no cyanosis no clubbing   EKG:  EKG is  ordered today. Atrial fibrillation, rate 71, axis within normal limits, intervals within normal limits, lateral T wave inversions consistent with repolarization changes.  Recent Labs: 03/26/2021: BUN 22; Creatinine, Ser 1.72; Hemoglobin 14.1; Platelets 147; Potassium 5.2; Sodium 141; TSH 1.470 07/27/2021: ALT 23    Lipid Panel    Component Value Date/Time   CHOL 104 07/27/2021 0907   TRIG 55.0 07/27/2021 0907   HDL 48.00 07/27/2021 0907    CHOLHDL 2 07/27/2021 0907   VLDL 11.0 07/27/2021 0907   LDLCALC 45 07/27/2021 0907   LDLCALC 82 04/09/2021 1417      Wt Readings from Last 3 Encounters:  08/14/21 (!) 328 lb 6.4 oz (149 kg)  07/27/21 (!) 325 lb (147.4 kg)  04/09/21 (!) 318 lb 9.6 oz (144.5 kg)      Other studies Reviewed: Additional studies/ records that were reviewed today include: None Review of the above records demonstrates:  NA   ASSESSMENT AND PLAN:  ATRIAL FIB:  [name] has a [name]2DS2 - VASc score of 3.    He tolerates anticoagulation.  He has good rate control.  No change in therapy.   CVA:    He has some mild residual left arm weakness.  No change in therapy.  Continue anticoagulation.  LVH : His last ejection fraction was 55 to 60%.  This was said to be severe previously but mild at the last evaluation.  I will follow this clinically and with repeat echocardiography in the future.    HTN: Today I am going to increase his Catapres patch to #3.  OBESITY: Today I gave him a 16 pound weight loss goal in the next 4 months.  Current medicines are reviewed at length with the patient today.  The patient does not have concerns regarding medicines.  The following changes have been made:   As above  Labs/ tests ordered today include:    None  Orders Placed This Encounter  Procedures   EKG 12-Lead      Disposition:   FU with me for months Signed, Thomas Rotunda, MD  08/14/2021 4:52 PM    [facility]

## 2021-08-14 ENCOUNTER — Ambulatory Visit (INDEPENDENT_AMBULATORY_CARE_PROVIDER_SITE_OTHER): Payer: No Typology Code available for payment source | Admitting: Cardiology

## 2021-08-14 ENCOUNTER — Encounter: Payer: Self-pay | Admitting: Cardiology

## 2021-08-14 ENCOUNTER — Other Ambulatory Visit: Payer: Self-pay

## 2021-08-14 VITALS — BP 160/110 | HR 71 | Ht 73.0 in | Wt 328.4 lb

## 2021-08-14 DIAGNOSIS — I482 Chronic atrial fibrillation, unspecified: Secondary | ICD-10-CM

## 2021-08-14 DIAGNOSIS — I119 Hypertensive heart disease without heart failure: Secondary | ICD-10-CM

## 2021-08-14 MED ORDER — CLONIDINE 0.3 MG/24HR TD PTWK: 0.3000 mg | MEDICATED_PATCH | TRANSDERMAL | 1 refills | Status: DC

## 2021-08-14 NOTE — Patient Instructions (Signed)
Medication Instructions:  INCREASE YOUR CLONIDINE PATCH TO 0.3 MG WEEKLY   *If you need a refill on your cardiac medications before your next appointment, please call your pharmacy*  Lab Work: NONE  Testing/Procedures: NONE  Follow-Up: At [facility], you and your health needs are our priority.  As part of our continuing mission to provide you with exceptional heart care, we have created designated Provider Care Teams.  These Care Teams include your primary Cardiologist (physician) and Advanced Practice Providers (APPs -  Physician Assistants and Nurse Practitioners) who all work together to provide you with the care you need, when you need it.  We recommend signing up for the patient portal called "MyChart".  Sign up information is provided on this After Visit Summary.  MyChart is used to connect with patients for Virtual Visits (Telemedicine).  Patients are able to view lab/test results, encounter notes, upcoming appointments, etc.  Non-urgent messages can be sent to your provider as well.   To learn more about what you can do with MyChart, go to [email or web address].    Your next appointment:   4 month(s)  The format for your next appointment:   In Person  Provider:   You may see DR [facility]  or one of the following Advanced Practice Providers on your designated Care Team:   Theodore Demark, PA-C Juanda Crumble, PA-C Joni Reining, DNP, ANP

## 2021-09-26 ENCOUNTER — Ambulatory Visit: Payer: No Typology Code available for payment source | Admitting: Family Medicine

## 2021-10-19 ENCOUNTER — Encounter: Payer: Self-pay | Admitting: Family Medicine

## 2021-10-19 ENCOUNTER — Other Ambulatory Visit: Payer: Self-pay

## 2021-10-19 ENCOUNTER — Other Ambulatory Visit: Payer: Self-pay | Admitting: Family Medicine

## 2021-10-19 ENCOUNTER — Ambulatory Visit (INDEPENDENT_AMBULATORY_CARE_PROVIDER_SITE_OTHER): Payer: No Typology Code available for payment source | Admitting: Family Medicine

## 2021-10-19 VITALS — BP 146/102 | HR 81 | Temp 98.5°F | Ht 73.0 in | Wt 333.0 lb

## 2021-10-19 DIAGNOSIS — D6869 Other thrombophilia: Secondary | ICD-10-CM

## 2021-10-19 DIAGNOSIS — Z6841 Body Mass Index (BMI) 40.0 and over, adult: Secondary | ICD-10-CM | POA: Diagnosis not present

## 2021-10-19 DIAGNOSIS — M1A00X0 Idiopathic chronic gout, unspecified site, without tophus (tophi): Secondary | ICD-10-CM | POA: Diagnosis not present

## 2021-10-19 DIAGNOSIS — I10 Essential (primary) hypertension: Secondary | ICD-10-CM

## 2021-10-19 DIAGNOSIS — N1831 Chronic kidney disease, stage 3a: Secondary | ICD-10-CM

## 2021-10-19 DIAGNOSIS — I119 Hypertensive heart disease without heart failure: Secondary | ICD-10-CM | POA: Diagnosis not present

## 2021-10-19 DIAGNOSIS — I4891 Unspecified atrial fibrillation: Secondary | ICD-10-CM

## 2021-10-19 DIAGNOSIS — I69359 Hemiplegia and hemiparesis following cerebral infarction affecting unspecified side: Secondary | ICD-10-CM

## 2021-10-19 DIAGNOSIS — I6930 Unspecified sequelae of cerebral infarction: Secondary | ICD-10-CM

## 2021-10-19 LAB — BASIC METABOLIC PANEL
BUN: 21 mg/dL (ref 6–23)
CO2: 28 mEq/L (ref 19–32)
Calcium: 9.3 mg/dL (ref 8.4–10.5)
Chloride: 104 mEq/L (ref 96–112)
Creatinine, Ser: 1.66 mg/dL — ABNORMAL HIGH (ref 0.40–1.50)
GFR: 51.37 mL/min — ABNORMAL LOW (ref >60.00)
Glucose, Bld: 93 mg/dL (ref 70–99)
Potassium: 4.1 mEq/L (ref 3.5–5.1)
Sodium: 139 mEq/L (ref 135–145)

## 2021-10-19 LAB — URIC ACID: Uric Acid, Serum: 6.7 mg/dL (ref 4.0–7.8)

## 2021-10-19 MED ORDER — BLOOD PRESSURE CUFF MISC: 0 refills | Status: DC

## 2021-10-19 MED ORDER — ALLOPURINOL 100 MG PO TABS: 150.0000 mg | ORAL_TABLET | Freq: Every day | ORAL | 3 refills | Status: DC

## 2021-10-19 NOTE — Patient Instructions (Signed)
   Great to see you today.  I have refilled the medication(s) we provide.   If labs were collected, we will inform you of lab results once received either by echart message or telephone call.   - echart message- for normal results that have been seen by the patient already.   - telephone call: abnormal results or if patient has not viewed results in their echart.  We will discuss time line for follow up after we get your results back.

## 2021-10-19 NOTE — Progress Notes (Signed)
Patient ID: Thomas [name]., [name]  DOB: [birth year], 40 y.o.   MRN: [id number] Patient Care Team    Relationship Specialty Notifications Start End  Natalia Leatherwood, DO PCP - General Family Medicine  03/26/21   Rollene Rotunda, MD Consulting Physician Cardiology  04/09/21     Chief Complaint  Patient presents with   Hypertension    CMC; pt is not fasting     Subjective: Thomas [name]. is a 40 y.o.  [name] present for Thomas [name] [place] Stage 3 chronic kidney disease, unspecified whether stage 3a or 3b CKD (HCC) Reviewed last GFR is 63 with a creatinine of 1.72.  Hemiplegia affecting dominant side, post-stroke (HCC)/History of cerebrovascular accident with residual deficit Stroke 2014, does not seem to have followed with neurology during this time.  Established with cardiology.  He is prescribed Xarelto and reports compliance. He reports compliance w/ crestor and tolerating without side effects.  He reports compliance  with dilt 360 mg qd, hydralazine 100 mg tid, benicar 40 mg qd, labetalol 200 mg tid and clonidine patch 0.3 mg. He was unable to tolerate spiro (breast tenderness) and HCTZ (gout) in the past. He unfortunately just took his medications.  Chronic atrial fibrillation (HCC)/Morbid obesity (HCC)/ HTN (hypertension), malignant/Atrial fibrillation with RVR (HCC) Pt reports compliance with Benicar 40 mg daily, diltiazem 360 mg daily, labetalol 200 mg 3 times daily, hydralazine 100 mg 3 times daily, clonidine patch 0.3 mg weekly. Blood pressures ranges at home are not checked.Patient denies chest pain, shortness of breath, dizziness or lower extremity edema.   Pt is on chronic anticoagulation with Xarelto 20 mg daily.  Patient is prescribed statin  echo 02/28/2017: EF 55-60% RF: Hypertension, obesity, CVA, cardiomyopathy, CKD, A. fib  Echocardiogram 02/28/2017: Study Conclusions  - Left ventricle: The cavity size was normal. Wall thickness was    increased in a pattern of mild LVH.  Systolic function was normal.    The estimated ejection fraction was in the range of 55% to 60%.    Wall motion was normal; there were no regional wall motion    abnormalities. The study is not technically sufficient to allow    evaluation of LV diastolic function.  - Mitral valve: There was trivial regurgitation.  - Left atrium: The atrium was mildly dilated. Volume/bsa, ES,    (1-plane Simpson&'s, A2C): 40.2 ml/m^2.  - Right ventricle: The cavity size was mildly dilated. Wall    thickness was normal.   Gout: He has a h/o gout. Restarted allopurinol 100 mg last visit. He has had no gout flares since. He has not able to take any diuretics for his uncontrolled BP bc gout flares- prior to allopurinol.   Depression screen Thomas [name] 2/9 10/19/2021 04/09/2021 05/10/2019 02/23/2019 10/13/2017  Decreased Interest 0 0 0 0 0  Down, Depressed, Hopeless 0 0 0 0 0  PHQ - 2 Score 0 0 0 0 0  Altered sleeping - - 0 - -  Tired, decreased energy - - 0 - -  Change in appetite - - 0 - -  Feeling bad or failure about yourself  - - 0 - -  Trouble concentrating - - 0 - -  Moving slowly or fidgety/restless - - 0 - -  Suicidal thoughts - - 0 - -  PHQ-9 Score - - 0 - -  Difficult doing work/chores - - Not difficult at all - -   No flowsheet data found.      Fall Risk  10/13/2017 05/21/2016 02/22/2015 12/28/2012  Falls in the past year? No No No No   Immunization History  Administered Date(s) Administered   Influenza Split 09/06/2016   Influenza Whole 10/29/2010   Influenza,inj,Quad PF,6+ Mos 09/03/2015, 09/06/2016, 09/22/2018   Influenza-Unspecified 11/01/2012, 09/05/2017, 09/21/2019, 09/07/2020, 09/05/2021   PFIZER(Purple Top)SARS-COV-2 Vaccination 08/11/2020, 09/01/2020, 03/24/2021   Tdap 12/02/2008, 04/09/2021    No results found.  Past Medical History:  Diagnosis Date   Atrial fibrillation ([place]) 10/29/2010   Benign essential hypertension 12/15/2008   Bilateral knee pain 04/04/2021   Cardiomyopathy  11/28/2010   CVA (cerebral vascular accident) [name])    right CVA/hemiplegia   Gout    History of chicken pox    Renal insufficiency 11/28/2010   No Known Allergies History reviewed. No pertinent surgical history. Family History  Problem Relation Age of Onset   Hypertension Mother    Diabetes Mother    Hypertension Father    Glaucoma Father    Alcohol abuse Father    Hearing loss Father    Hyperlipidemia Other        Parent   Social History   Social History Narrative   Neither of his parents nor any siblings have any cardiac or heart rhythm issues. He has 2 grandparents that were on blood thinners but doesn't know why.      Regular exercise-no   Caffeine Use-yes    Allergies as of 10/19/2021   No Known Allergies      Medication List        Accurate as of October 19, 2021  3:32 PM. If you have any questions, ask your nurse or doctor.          allopurinol 100 MG tablet Commonly known as: ZYLOPRIM Take 1 tablet (100 mg total) by mouth daily.   Blood Pressure Cuff Misc Extra large BP cuff, monitor blood pressure daily. Started by: Thomas Pouch, DO   cloNIDine 0.3 mg/24hr patch Commonly known as: CATAPRES - Dosed in mg/24 hr Place 1 patch (0.3 mg total) onto the skin once a week.   diltiazem 360 MG 24 hr capsule Commonly known as: Cardizem CD Take 1 capsule (360 mg total) by mouth daily.   hydrALAZINE 100 MG tablet Commonly known as: APRESOLINE Take 1 tablet (100 mg total) by mouth 3 (three) times daily.   labetalol 200 MG tablet Commonly known as: NORMODYNE Take 1 tablet (200 mg total) by mouth 3 (three) times daily.   olmesartan 40 MG tablet Commonly known as: BENICAR TAKE 1 TABLET BY MOUTH ONCE A DAY   rivaroxaban 20 MG Tabs tablet Commonly known as: XARELTO Take 1 tablet (20 mg total) by mouth daily with supper.   rosuvastatin 10 MG tablet Commonly known as: Crestor Take 1 tablet by mouth in the evening on Monday, Wednesday and Friday as  directed        All past medical history, surgical history, allergies, family history, immunizations andmedications were updated in the EMR today and reviewed under the history and medication portions of their EMR.     No results found.   ROS: 14 pt review of systems performed and negative (unless mentioned in an HPI)  Objective: BP (!) 146/102   Pulse 81   Temp 98.5 F (36.9 C) (Oral)   Ht 6\' 1"  (1.854 m)   Wt (!) 333 lb (151 kg)   SpO2 99%   BMI 43.93 kg/m  Gen: Afebrile. No acute distress.nontoxic pleasant obese [name].  HENT: AT. [place].  Eyes:Pupils  Equal Round Reactive to light, Extraocular movements intact,  Conjunctiva without redness, discharge or icterus. CV: RRR no murmur, trace edema Chest: CTAB, no wheeze or crackles Skin: no rashes, purpura or petechiae.  Neuro:  Normal gait. PERLA. EOMi. Alert. Oriented x3 Psych: Normal affect, dress and demeanor. Normal speech. Normal thought content and judgment.   Assessment/plan: Thomas Rei. is a 40 y.o. [name] present for  [facility] Stage 3 chronic kidney disease, unspecified whether stage 3a or 3b CKD ([place]) Avoid nephrotoxic agents. Renally dose meds when appropriate. Bmp collected today  Hemiplegia affecting dominant side, post-stroke (HCC)/History of cerebrovascular accident with residual deficit/chronic anticoagulation Continue clonidine 0.3 mg weekly patch-prescribed by cardiology.  Continue labetalol 200 mg 3 times daily-prescribed by cardiology Continue  Benicar 40 mg daily Continue hydralazine at increased dose  100 mg 3 times daily Continue  diltiazem 360 mg daily. Continue Xarelto 20 mg daily Continue Crestor 10 mg daily   Chronic atrial fibrillation (HCC)/Morbid obesity (HCC)/ HTN (hypertension), malignant/Atrial fibrillation with RVR (HCC)/acquired thrombophilia/morbid obesity BP improving. May be at goal if he had taken his meds earlier before appt.  BP monitor system prescribed for him to start checking BP  once a day.  Continue clonidine 0.3 mg weekly patch-prescribed by cardiology.  Continue labetalol 200 mg 3 times daily-prescribed by cardiology Continue  Benicar 40 mg daily Continue hydralazine at increased dose 100 mg 3 times daily Continue  diltiazem 360 mg daily. Continue Xarelto 20 mg daily Continue Crestor 10 mg daily - renin/aldosterone repeat - last 2010 - metanephrines appear mildly elevated 12/2008? Consider further eval. - Pt reports 2 sleep studies since 2014 have been normal.   Gout:  Uric acid levels collected today since starting allopurinol. Goal UA <6 on allopurinol  bmp   Return for f/u dependent upon lab results. .  Orders Placed This Encounter  Procedures   Uric acid   Basic Metabolic Panel (BMET)     Meds ordered this encounter  Medications   Blood Pressure Monitoring (BLOOD PRESSURE CUFF) MISC    Sig: Extra large BP cuff, monitor blood pressure daily.    Dispense:  1 each    Refill:  0     Referral Orders  No referral(s) requested today     Note is dictated utilizing voice recognition software. Although note has been proof read prior to signing, occasional typographical errors still can be missed. If any questions arise, please do not hesitate to call for verification.  Electronically signed by: Thomas Pouch, DO [place]

## 2021-12-07 ENCOUNTER — Ambulatory Visit: Payer: No Typology Code available for payment source | Admitting: Adult Health

## 2022-01-18 ENCOUNTER — Other Ambulatory Visit: Payer: Self-pay | Admitting: Family Medicine

## 2022-01-28 ENCOUNTER — Ambulatory Visit: Payer: No Typology Code available for payment source | Admitting: Cardiology

## 2022-02-07 ENCOUNTER — Other Ambulatory Visit: Payer: Self-pay

## 2022-02-07 ENCOUNTER — Ambulatory Visit: Payer: No Typology Code available for payment source | Admitting: Cardiology

## 2022-02-07 MED ORDER — OLMESARTAN MEDOXOMIL 40 MG PO TABS: ORAL_TABLET | Freq: Every day | ORAL | 0 refills | Status: DC

## 2022-02-19 ENCOUNTER — Other Ambulatory Visit: Payer: Self-pay | Admitting: Family Medicine

## 2022-02-23 ENCOUNTER — Other Ambulatory Visit: Payer: Self-pay | Admitting: Family Medicine

## 2022-02-25 NOTE — Progress Notes (Signed)
?  ?Cardiology Office Note ? ? ?Date:  02/26/2022  ? ?ID:  [name], DOB 04-Jan-1981, MRN [id number] ? ?PCP:  Claiborne Billings, Renee A, DO  ?Cardiologist:   Rollene Rotunda, MD  ? ? ?Chief Complaint  ?Patient presents with  ? Atrial Fibrillation  ? Hypertension  ? ? ? ?  ?History of Present Illness: ?Thomas [name]. is a 41 y.o. male who presents for follow up of atrial fib, HTN and a CVA. He has been treated with tPA in past for his CVA.  He has had uncontrolled hypertension.     Since I last saw him his blood pressure is obtained in the 150s over 90s range.  He forgot to wear his patch today because he changed it yesterday.  But since increasing his clonidine patch with the other meds as listed he has done well. The patient denies any new symptoms such as chest discomfort, neck or arm discomfort. There has been no new shortness of breath, PND or orthopnea. There have been no reported palpitations, presyncope or syncope.  He says he does have a lot of stress.  He is about to buy a house.  He and his wife are going through IVF.  He says overall he feels well.  He is not noticing his atrial fibrillation.   ? ?Past Medical History:  ?Diagnosis Date  ? Atrial fibrillation (HCC) 10/29/2010  ? Benign essential hypertension 12/15/2008  ? Bilateral knee pain 04/04/2021  ? Cardiomyopathy 11/28/2010  ? CV[facility] (ce[facility] vascular accident) [facility])   ? right CVA/hemiplegia  ? Gout   ? History of chicken pox   ? Renal insufficiency 11/28/2010  ? ? ?History reviewed. No pertinent surgical history. ? ? ?Current Outpatient Medications  ?Medication Sig Dispense Refill  ? allopurinol (ZYLOPRIM) 100 MG tablet Take 1.5 tablets (150 mg total) by mouth daily. 135 tablet 3  ? cloNIDine (CATAPRES - DOSED IN MG/24 HR) 0.3 mg/24hr patch Place 1 patch (0.3 mg total) onto the skin once a week. 90 patch 1  ? diltiazem (CARDIZEM CD) 360 MG 24 hr capsule TAKE 1 CAPSULE BY MOUTH EVERY DAY 30 capsule 0  ? hydrALAZINE (APRESOLINE) 100 MG tablet TAKE 1  TABLET BY MOUTH 3 TIMES DAILY. 90 tablet 0  ? labetalol (NORMODYNE) 200 MG tablet Take 1 tablet (200 mg total) by mouth 3 (three) times daily. 252 tablet 3  ? olmesartan (BENICAR) 40 MG tablet TAKE 1 TABLET BY MOUTH ONCE A DAY 30 tablet 0  ? rivaroxaban (XARELTO) 20 MG TABS tablet Take 1 tablet (20 mg total) by mouth daily with supper. 90 tablet 3  ? rosuvastatin (CRESTOR) 10 MG tablet Take 1 tablet by mouth in the evening on Monday, Wednesday and Friday as directed 40 tablet 3  ? Blood Pressure Monitoring (BLOOD PRESSURE CUFF) MISC Extra large BP cuff, monitor blood pressure daily. (Patient not taking: Reported on 02/26/2022) 1 each 0  ? busPIRone (BUSPAR) 10 MG tablet TAKE ONE-HALF TABLET BY MOUTH THREE TIMES A DAY FOR 7 DAYS, THEN TAKE ONE TABLET THREE TIMES A DAY (Patient not taking: Reported on 02/26/2022)    ? ?No current facility-administered medications for this visit.  ? ? ?Allergies:   Spironolactone  ? ? ?ROS:  Please see the history of present illness.   Otherwise, review of systems are positive for none.   All other systems are reviewed and negative.  ? ? ?PHYSICAL EXAM: ?VS:  BP (!) 172/102   Pulse (!) 54  Ht 6\' 1"  (1.854 m)   Wt (!) 332 lb 6.4 oz (150.8 kg)   SpO2 93%   BMI 43.85 kg/m?  , BMI Body mass index is 43.85 kg/m?.  ?GENERAL:  Well appearing ?NECK:  No jugular venous distention, waveform within normal limits, carotid upstroke brisk and symmetric, no bruits, no thyromegaly ?LUNGS:  Clear to auscultation bilaterally ?CHEST:  Unremarkable ?HEART:  PMI not displaced or sustained,S1 and S2 within normal limits, no S3, no clicks, no rubs, no murmurs, irregular  ?ABD:  Flat, positive bowel sounds normal in frequency in pitch, no bruits, no rebound, no guarding, no midline pulsatile mass, no hepatomegaly, no splenomegaly ?EXT:  2 plus pulses throughout, mild edema, no cyanosis no clubbing ? ? ?EKG:  EKG is   ordered today. ?Atrial fibrillation, rate 54, axis within normal limits, intervals within  normal limits, lateral T wave inversions consistent with repolarization changes.  Baseline artifact ? ?Recent Labs: ?03/26/2021: Hemoglobin 14.1; Platelets 147; TSH 1.470 ?07/27/2021: ALT 23 ?10/19/2021: BUN 21; Creatinine, Ser 1.66; Potassium 4.1; Sodium 139  ? ? ?Lipid Panel ?   ?Component Value Date/Time  ? CHOL 104 07/27/2021 0907  ? TRIG 55.0 07/27/2021 0907  ? HDL 48.00 07/27/2021 0907  ? CHOLHDL 2 07/27/2021 0907  ? VLDL 11.0 07/27/2021 0907  ? LDLCALC 45 07/27/2021 0907  ? LDLCALC 82 04/09/2021 1417  ? ?  ? ?Wt Readings from Last 3 Encounters:  ?02/26/22 (!) 332 lb 6.4 oz (150.8 kg)  ?10/19/21 (!) 333 lb (151 kg)  ?08/14/21 (!) 328 lb 6.4 oz (149 kg)  ?  ? ? ?Other studies Reviewed: ?Additional studies/ records that were reviewed today include: None ?Review of the above records demonstrates: See elsewhere ? ? ?ASSESSMENT AND PLAN: ? ?ATRIAL FIB:  Mr. [name] has a [name]2DS2 - VASc score of 3.   He tolerates anticoagulation and has had good rate control.  No change in therapy. ? ?CVA:   He has some mild residual left arm weakness.  He will continue his anticoagulation.  No change in therapy. ? ?LVH : His last ejection fraction was 55 to 60%.  This was mild on echo.  No change in therapy.  ? ?HTN:   We had a long conversation about this.  He is on maximal therapy except probably for the addition of minoxidil.   I could consider enrollment in a renal denervation trial.   I am going to refer him as below for weight loss.  He is also having a repeat sleep study though the one he had years ago was negative.  If he has weight loss and treatable sleep apnea perhaps he will have some improvement in his blood pressure is.  ? ?OBESITY:    I had a long conversation with him today about this.  [facility] am [facility] to [facility]  ? ?Current medicines are reviewed at length with the patient today.  The patient does not have concerns regarding medicines. ? ?The following changes have been made:    None ? ?Labs/ tests ordered today include:     None  ? ?Orders Placed This Encounter  ?Procedures  ? Amb Ref to [facility]  ? EKG 12-Lead  ? ? ?Disposition:   FU with me in 6 months ? ? ?Signed, ?Beryle Quant, MD  ?02/26/2022 5:42 PM    ?[facility] ?

## 2022-02-26 ENCOUNTER — Ambulatory Visit (INDEPENDENT_AMBULATORY_CARE_PROVIDER_SITE_OTHER): Payer: No Typology Code available for payment source | Admitting: Cardiology

## 2022-02-26 ENCOUNTER — Other Ambulatory Visit: Payer: Self-pay

## 2022-02-26 ENCOUNTER — Encounter: Payer: Self-pay | Admitting: Cardiology

## 2022-02-26 VITALS — BP 172/102 | HR 54 | Ht 73.0 in | Wt 332.4 lb

## 2022-02-26 DIAGNOSIS — I639 Cerebral infarction, unspecified: Secondary | ICD-10-CM

## 2022-02-26 DIAGNOSIS — I482 Chronic atrial fibrillation, unspecified: Secondary | ICD-10-CM | POA: Diagnosis not present

## 2022-02-26 DIAGNOSIS — I10 Essential (primary) hypertension: Secondary | ICD-10-CM

## 2022-02-26 NOTE — Patient Instructions (Signed)
Medication Instructions:  ?No changes ?*If you need a refill on your cardiac medications before your next appointment, please call your pharmacy* ? ? ?Lab Work: ?None ordered ?If you have labs (blood work) drawn today and your tests are completely normal, you will receive your results only by: ?MyChart Message (if you have MyChart) OR ?A paper copy in the mail ?If you have any lab test that is abnormal or we need to change your treatment, we will call you to review the results. ? ? ?Testing/Procedures: ?None ordered ? ? ?Follow-Up: ?At [facility], you and your health needs are our priority.  As part of our continuing mission to provide you with exceptional heart care, we have created designated Provider Care Teams.  These Care Teams include your primary Cardiologist (physician) and Advanced Practice Providers (APPs -  Physician Assistants and Nurse Practitioners) who all work together to provide you with the care you need, when you need it. ? ?We recommend signing up for the patient portal called "MyChart".  Sign up information is provided on this After Visit Summary.  MyChart is used to connect with patients for Virtual Visits (Telemedicine).  Patients are able to view lab/test results, encounter notes, upcoming appointments, etc.  Non-urgent messages can be sent to your provider as well.   ?To learn more about what you can do with MyChart, go to [email or web address].   ? ?Your next appointment:   ?6 month(s) ? ?The format for your next appointment:   ?In Person ? ?Provider:   ?Dr. Antoine Poche ?

## 2022-03-17 ENCOUNTER — Other Ambulatory Visit: Payer: Self-pay | Admitting: Family Medicine

## 2022-03-25 ENCOUNTER — Other Ambulatory Visit: Payer: Self-pay | Admitting: Family Medicine

## 2022-03-27 ENCOUNTER — Other Ambulatory Visit: Payer: Self-pay

## 2022-03-27 MED ORDER — OLMESARTAN MEDOXOMIL 40 MG PO TABS
ORAL_TABLET | Freq: Every day | ORAL | 0 refills | Status: DC
Start: 2022-03-27 — End: 2022-03-29

## 2022-03-27 MED ORDER — DILTIAZEM HCL ER COATED BEADS 360 MG PO CP24: ORAL_CAPSULE | ORAL | 0 refills | Status: DC

## 2022-03-29 ENCOUNTER — Ambulatory Visit (INDEPENDENT_AMBULATORY_CARE_PROVIDER_SITE_OTHER): Payer: No Typology Code available for payment source | Admitting: Family Medicine

## 2022-03-29 VITALS — BP 158/90 | HR 65 | Temp 97.9°F | Ht 73.0 in | Wt 336.0 lb

## 2022-03-29 DIAGNOSIS — I4891 Unspecified atrial fibrillation: Secondary | ICD-10-CM

## 2022-03-29 DIAGNOSIS — N1831 Chronic kidney disease, stage 3a: Secondary | ICD-10-CM

## 2022-03-29 DIAGNOSIS — Z6841 Body Mass Index (BMI) 40.0 and over, adult: Secondary | ICD-10-CM | POA: Diagnosis not present

## 2022-03-29 DIAGNOSIS — D6869 Other thrombophilia: Secondary | ICD-10-CM

## 2022-03-29 DIAGNOSIS — I6930 Unspecified sequelae of cerebral infarction: Secondary | ICD-10-CM

## 2022-03-29 DIAGNOSIS — I69359 Hemiplegia and hemiparesis following cerebral infarction affecting unspecified side: Secondary | ICD-10-CM

## 2022-03-29 DIAGNOSIS — I10 Essential (primary) hypertension: Secondary | ICD-10-CM | POA: Diagnosis not present

## 2022-03-29 DIAGNOSIS — M109 Gout, unspecified: Secondary | ICD-10-CM | POA: Diagnosis not present

## 2022-03-29 DIAGNOSIS — R7309 Other abnormal glucose: Secondary | ICD-10-CM

## 2022-03-29 DIAGNOSIS — I119 Hypertensive heart disease without heart failure: Secondary | ICD-10-CM

## 2022-03-29 MED ORDER — HYDRALAZINE HCL 100 MG PO TABS: 100.0000 mg | ORAL_TABLET | Freq: Three times a day (TID) | ORAL | 1 refills | Status: AC

## 2022-03-29 MED ORDER — DILTIAZEM HCL ER COATED BEADS 360 MG PO CP24: ORAL_CAPSULE | ORAL | 1 refills | Status: DC

## 2022-03-29 MED ORDER — RIVAROXABAN 20 MG PO TABS: 20.0000 mg | ORAL_TABLET | Freq: Every day | ORAL | 3 refills | Status: AC

## 2022-03-29 MED ORDER — OLMESARTAN MEDOXOMIL 40 MG PO TABS: ORAL_TABLET | Freq: Every day | ORAL | 1 refills | Status: DC

## 2022-03-29 NOTE — Progress Notes (Signed)
? ? ? ?Patient ID: Thomas Casa., male  DOB: Jul 26, 1981, 41 y.o.   MRN: [id number] ?Patient Care Team  ?  Relationship Specialty Notifications Start End  ?Ma Hillock, DO PCP - General Family Medicine  03/26/21   ?Minus Breeding, MD Consulting Physician Cardiology  04/09/21   ? ? ?Chief Complaint  ?Patient presents with  ? Hypertension  ?  Cmc; pt is not fasting  ? ? ? ?Subjective: ?Thomas Lenox. is a 41 y.o.  male present for [facility] ?Stage 3 chronic kidney disease, unspecified whether stage 3a or 3b CKD ([place]) ?Reviewed last GFR is 51 with a creatinine of 1.72. ? ?Hemiplegia affecting dominant side, post-stroke (HCC)/History of cerebrovascular accident with residual deficit ?Stroke 2014, does not seem to have followed with neurology during this time.  Established with cardiology.  He is prescribed Xarelto and reports compliance. He reports compliance w/ crestor and tolerating without side effects.  ?He reports compliance  with dilt 360 mg qd, hydralazine 100 mg tid, benicar 40 mg qd, labetalol 200 mg tid and clonidine patch 0.3 mg. He was unable to tolerate spiro (breast tenderness) and HCTZ (gout) in the past.  ? ?Chronic atrial fibrillation (HCC)/Morbid obesity (HCC)/ HTN (hypertension), malignant/Atrial fibrillation with RVR ([place]) ?Pt reports compliance with Benicar 40 mg daily, diltiazem 360 mg daily, labetalol 200 mg 3 times daily, hydralazine 100 mg 3 times daily, clonidine patch 0.3 mg weekly. Patient denies chest pain, shortness of breath, dizziness or lower extremity edema.  ? Pt is on chronic anticoagulation with Xarelto 20 mg daily.  Patient is prescribed statin  ?echo 02/28/2017: EF 55-60% ?RF: Hypertension, obesity, CVA, cardiomyopathy, CKD, A. fib ? ?Echocardiogram 02/28/2017: ?Study Conclusions  ?- Left ventricle: The cavity size was normal. Wall thickness was  ?  increased in a pattern of mild LVH. Systolic function was normal.  ?  The estimated ejection fraction was in the range of 55% to 60%.   ?  Wall motion was normal; there were no regional wall motion  ?  abnormalities. The study is not technically sufficient to allow  ?  evaluation of LV diastolic function.  ?- Mitral valve: There was trivial regurgitation.  ?- Left atrium: The atrium was mildly dilated. Volume/bsa, ES,  ?  (1-plane Simpson&'s, A2C): 40.2 ml/m^2.  ?- Right ventricle: The cavity size was mildly dilated. Wall  ?  thickness was normal.  ? ?Gout: ?He has a h/o gout. Compliant allopurinol 150 mg last visit. He has had no gout flares since. He has not able to take any diuretics for his uncontrolled BP bc gout flares- prior to allopurinol.  ? ? ?  10/19/2021  ? 10:31 AM 04/09/2021  ?  1:42 PM 05/10/2019  ?  3:06 PM 02/23/2019  ? 10:17 AM 10/13/2017  ? 12:52 PM  ?Depression screen PHQ 2/9  ?Decreased Interest 0 0 0 0 0  ?Down, Depressed, Hopeless 0 0 0 0 0  ?PHQ - 2 Score 0 0 0 0 0  ?Altered sleeping   0    ?Tired, decreased energy   0    ?Change in appetite   0    ?Feeling bad or failure about yourself    0    ?Trouble concentrating   0    ?Moving slowly or fidgety/restless   0    ?Suicidal thoughts   0    ?PHQ-9 Score   0    ?Difficult doing work/chores   Not difficult at all    ? ?   ?  View : No data to display.  ?  ?  ?  ? ? ?  ?  ? ?  10/13/2017  ? 12:52 PM 05/21/2016  ?  8:31 AM 02/22/2015  ?  2:36 PM 12/28/2012  ? 10:59 AM  ?Fall Risk   ?Falls in the past year? No No No No  ? ?Immunization History  ?Administered Date(s) Administered  ? Influenza Split 09/06/2016  ? Influenza Whole 10/29/2010  ? Influenza,inj,Quad PF,6+ Mos 09/03/2015, 09/06/2016, 09/22/2018  ? Influenza-Unspecified 11/01/2012, 09/05/2017, 09/21/2019, 09/07/2020, 09/05/2021, 09/16/2021  ? PFIZER(Purple Top)SARS-COV-2 Vaccination 08/11/2020, 09/01/2020, 03/24/2021, 09/01/2021  ? Tdap 12/02/2008, 04/09/2021  ? ? ?No results found. ? ?Past Medical History:  ?Diagnosis Date  ? Atrial fibrillation ([place]) 10/29/2010  ? Benign essential hypertension 12/15/2008  ? Bilateral knee pain  04/04/2021  ? Cardiomyopathy 11/28/2010  ? CVA (cerebral vascular accident) [facility])   ? right CVA/hemiplegia  ? Gout   ? History of chicken pox   ? Renal insufficiency 11/28/2010  ? ?Allergies  ?Allergen Reactions  ? Spironolactone Other (See Comments)  ?  Hyperkalemia ?  ? ?History reviewed. No pertinent surgical history. ?Family History  ?Problem Relation Age of Onset  ? Hypertension Mother   ? Diabetes Mother   ? Hypertension Father   ? Glaucoma Father   ? Alcohol abuse Father   ? Hearing loss Father   ? Hyperlipidemia Other   ?     Parent  ? ?Social History  ? ?Social History Narrative  ? Neither of his parents nor any siblings have any cardiac or heart rhythm issues. He has 2 grandparents that were on blood thinners but doesn't know why.  ?   ? Regular exercise-no  ? Caffeine Use-yes  ? ? ?Allergies as of 03/29/2022   ? ?   Reactions  ? Spironolactone Other (See Comments)  ? Hyperkalemia  ? ?  ? ?  ?Medication List  ?  ? ?  ? Accurate as of March 29, 2022  3:24 PM. If you have any questions, ask your nurse or doctor.  ?  ?  ? ?  ? ?STOP taking these medications   ? ?Blood Pressure Cuff Misc ?Stopped by: Howard Pouch, DO ?  ? ?  ? ?TAKE these medications   ? ?allopurinol 100 MG tablet ?Commonly known as: ZYLOPRIM ?Take 1.5 tablets (150 mg total) by mouth daily. ?  ?busPIRone 10 MG tablet ?Commonly known as: BUSPAR ?Take 10 mg by mouth 3 (three) times daily. ?What changed: Another medication with the same name was removed. Continue taking this medication, and follow the directions you see here. ?Changed by: Howard Pouch, DO ?  ?cloNIDine 0.3 mg/24hr patch ?Commonly known as: CATAPRES - Dosed in mg/24 hr ?Place 1 patch (0.3 mg total) onto the skin once a week. ?  ?diltiazem 360 MG 24 hr capsule ?Commonly known as: CARDIZEM CD ?TAKE 1 CAPSULE BY MOUTH EVERY DAY ?  ?hydrALAZINE 100 MG tablet ?Commonly known as: APRESOLINE ?Take 1 tablet (100 mg total) by mouth 3 (three) times daily. ?  ?labetalol 200 MG tablet ?Commonly  known as: NORMODYNE ?Take 1 tablet (200 mg total) by mouth 3 (three) times daily. ?  ?olmesartan 40 MG tablet ?Commonly known as: BENICAR ?TAKE 1 TABLET BY MOUTH ONCE A DAY ?  ?rivaroxaban 20 MG Tabs tablet ?Commonly known as: XARELTO ?Take 1 tablet (20 mg total) by mouth daily with supper. ?  ?rosuvastatin 10 MG tablet ?Commonly known as: Crestor ?Take 1 tablet by  mouth in the evening on Monday, Wednesday and Friday as directed ?  ? ?  ? ? ?All past medical history, surgical history, allergies, family history, immunizations andmedications were updated in the EMR today and reviewed under the history and medication portions of their EMR.    ? ?No results found. ? ? ?ROS: 14 pt review of systems performed and negative (unless mentioned in an HPI) ? ?Objective: ?BP (!) 158/90   Pulse 65   Temp 97.9 ?F (36.6 ?C) (Oral)   Ht 6\' 1"  (1.854 m)   Wt (!) 336 lb (152.4 kg)   SpO2 98%   BMI 44.33 kg/m?  ?Physical Exam ?Vitals and nursing note reviewed.  ?Constitutional:   ?   General: He is not in acute distress. ?   Appearance: Normal appearance. He is obese. He is not ill-appearing, toxic-appearing or diaphoretic.  ?HENT:  ?   Head: Normocephalic and atraumatic.  ?Eyes:  ?   General: No scleral icterus.    ?   Right eye: No discharge.     ?   Left eye: No discharge.  ?   Extraocular Movements: Extraocular movements intact.  ?   Pupils: Pupils are equal, round, and reactive to light.  ?Cardiovascular:  ?   Rate and Rhythm: Normal rate and regular rhythm.  ?   Heart sounds: No murmur heard. ?Pulmonary:  ?   Effort: Pulmonary effort is normal. No respiratory distress.  ?   Breath sounds: Normal breath sounds. No wheezing, rhonchi or rales.  ?Musculoskeletal:  ?   Cervical back: Neck supple.  ?   Right lower leg: No edema.  ?   Left lower leg: No edema.  ?Lymphadenopathy:  ?   Cervical: No cervical adenopathy.  ?Skin: ?   General: Skin is warm and dry.  ?   Coloration: Skin is not jaundiced or pale.  ?   Findings: No rash.   ?Neurological:  ?   Mental Status: He is alert and oriented to person, place, and time. Mental status is at baseline.  ?Psychiatric:     ?   Mood and Affect: Mood normal.     ?   Behavior: Behavior normal.

## 2022-03-29 NOTE — Patient Instructions (Signed)
No follow-ups on file.        Great to see you today.  I have refilled the medication(s) we provide.   If labs were collected, we will inform you of lab results once received either by echart message or telephone call.   - echart message- for normal results that have been seen by the patient already.   - telephone call: abnormal results or if patient has not viewed results in their echart.  

## 2022-03-30 LAB — COMPREHENSIVE METABOLIC PANEL
AG Ratio: 1.5 (calc) (ref 1.0–2.5)
ALT: 30 U/L (ref 9–46)
AST: 31 U/L (ref 10–40)
Albumin: 4.3 g/dL (ref 3.6–5.1)
Alkaline phosphatase (APISO): 41 U/L (ref 36–130)
BUN/Creatinine Ratio: 11 (calc) (ref 6–22)
BUN: 18 mg/dL (ref 7–25)
CO2: 26 mmol/L (ref 20–32)
Calcium: 9.1 mg/dL (ref 8.6–10.3)
Chloride: 104 mmol/L (ref 98–110)
Creat: 1.61 mg/dL — ABNORMAL HIGH (ref 0.60–1.29)
Globulin: 2.8 g/dL (calc) (ref 1.9–3.7)
Glucose, Bld: 92 mg/dL (ref 65–99)
Potassium: 4.4 mmol/L (ref 3.5–5.3)
Sodium: 139 mmol/L (ref 135–146)
Total Bilirubin: 0.4 mg/dL (ref 0.2–1.2)
Total Protein: 7.1 g/dL (ref 6.1–8.1)

## 2022-03-30 LAB — CBC WITH DIFFERENTIAL/PLATELET
Absolute Monocytes: 361 cells/uL (ref 200–950)
Basophils Absolute: 11 cells/uL (ref 0–200)
Basophils Relative: 0.3 %
Eosinophils Absolute: 91 cells/uL (ref 15–500)
Eosinophils Relative: 2.6 %
HCT: 43 % (ref 38.5–50.0)
Hemoglobin: 14 g/dL (ref 13.2–17.1)
Lymphs Abs: 1453 cells/uL (ref 850–3900)
MCH: 28.9 pg (ref 27.0–33.0)
MCHC: 32.6 g/dL (ref 32.0–36.0)
MCV: 88.7 fL (ref 80.0–100.0)
MPV: 12.4 fL (ref 7.5–12.5)
Monocytes Relative: 10.3 %
Neutro Abs: 1586 cells/uL (ref 1500–7800)
Neutrophils Relative %: 45.3 %
Platelets: 140 10*3/uL (ref 140–400)
RBC: 4.85 10*6/uL (ref 4.20–5.80)
RDW: 13.4 % (ref 11.0–15.0)
Total Lymphocyte: 41.5 %
WBC: 3.5 10*3/uL — ABNORMAL LOW (ref 3.8–10.8)

## 2022-03-30 LAB — HEMOGLOBIN A1C
Hgb A1c MFr Bld: 5.6 % of total Hgb (ref <5.7)
Mean Plasma Glucose: 114 mg/dL
eAG (mmol/L): 6.3 mmol/L

## 2022-03-30 LAB — TSH: TSH: 1.15 mIU/L (ref 0.40–4.50)

## 2022-03-30 LAB — URIC ACID: Uric Acid, Serum: 5.8 mg/dL (ref 4.0–8.0)

## 2022-04-01 ENCOUNTER — Telehealth: Payer: Self-pay | Admitting: Family Medicine

## 2022-04-01 MED ORDER — EPLERENONE 25 MG PO TABS: 25.0000 mg | ORAL_TABLET | Freq: Every day | ORAL | 1 refills | Status: DC

## 2022-04-01 MED ORDER — ALLOPURINOL 100 MG PO TABS: 150.0000 mg | ORAL_TABLET | Freq: Every day | ORAL | 3 refills | Status: DC

## 2022-04-01 NOTE — Telephone Encounter (Signed)
Please inform patient his uric acid is now well suppressed.  Continue allopurinol at current dose. ?I have called in the a eplerenone 1 tab daily in the morning.  This is a medication that is a diuretic and similar to spironolactone without the side effect of breast tenderness. ? ?We will however need to monitor his potassium on this medication. ? ?Please have him follow-up in 4 weeks with this provider for blood pressure recheck on new medication. ?

## 2022-04-01 NOTE — Telephone Encounter (Signed)
LVM for pt to CB regarding results.  

## 2022-04-02 NOTE — Telephone Encounter (Signed)
Spoke with pt regarding labs and instructions.   

## 2022-04-04 ENCOUNTER — Other Ambulatory Visit: Payer: Self-pay

## 2022-04-04 MED ORDER — LABETALOL HCL 200 MG PO TABS: 200.0000 mg | ORAL_TABLET | Freq: Three times a day (TID) | ORAL | 3 refills | Status: AC

## 2022-04-23 ENCOUNTER — Other Ambulatory Visit: Payer: Self-pay | Admitting: Family Medicine

## 2022-04-30 ENCOUNTER — Ambulatory Visit: Payer: No Typology Code available for payment source | Admitting: Family Medicine

## 2022-05-03 IMAGING — DX DG SHOULDER 2+V*R*
3 series · 3 of 3 positions shown · non-contrast
Comparison: Chest radiograph dated 12/07/2012.

CLINICAL DATA: 39-year-old male with fall and right shoulder pain.

EXAM:
RIGHT SHOULDER - 2+ VIEW

[shoulder grashey]
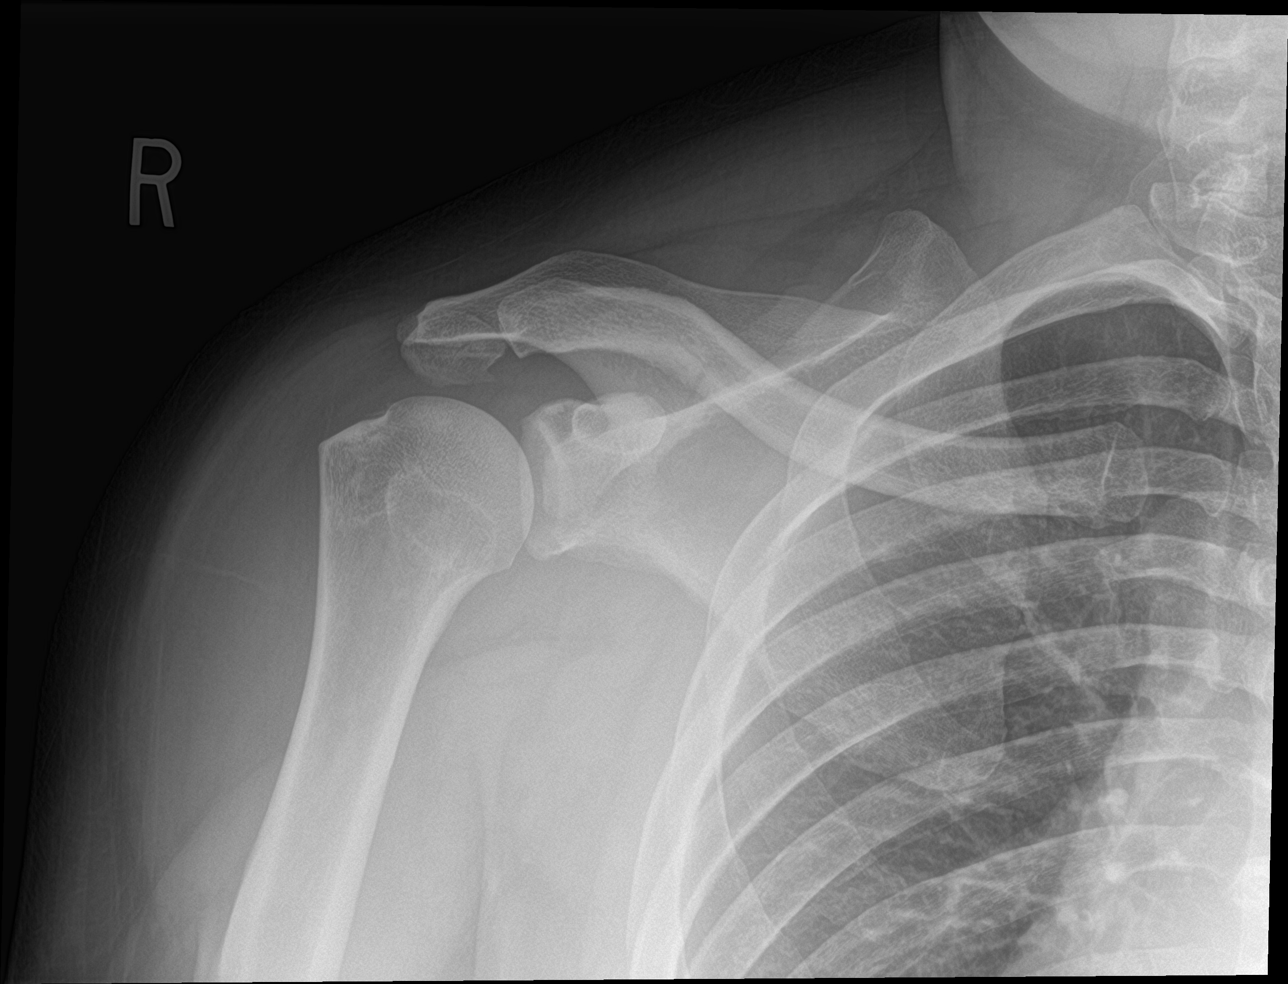

[shoulder y view]
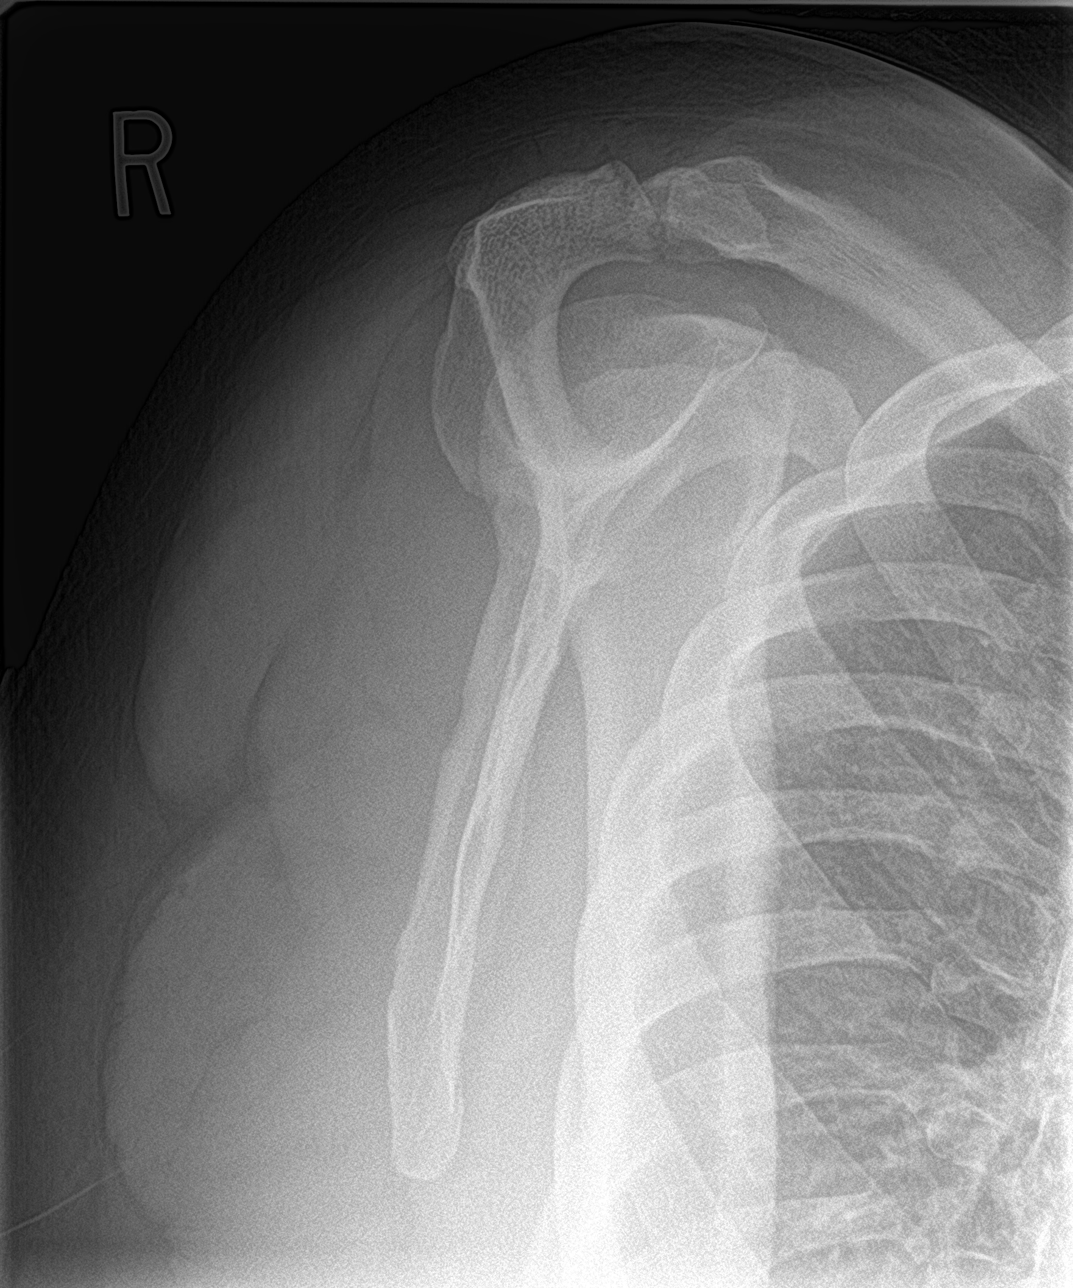

[shoulder axillary]
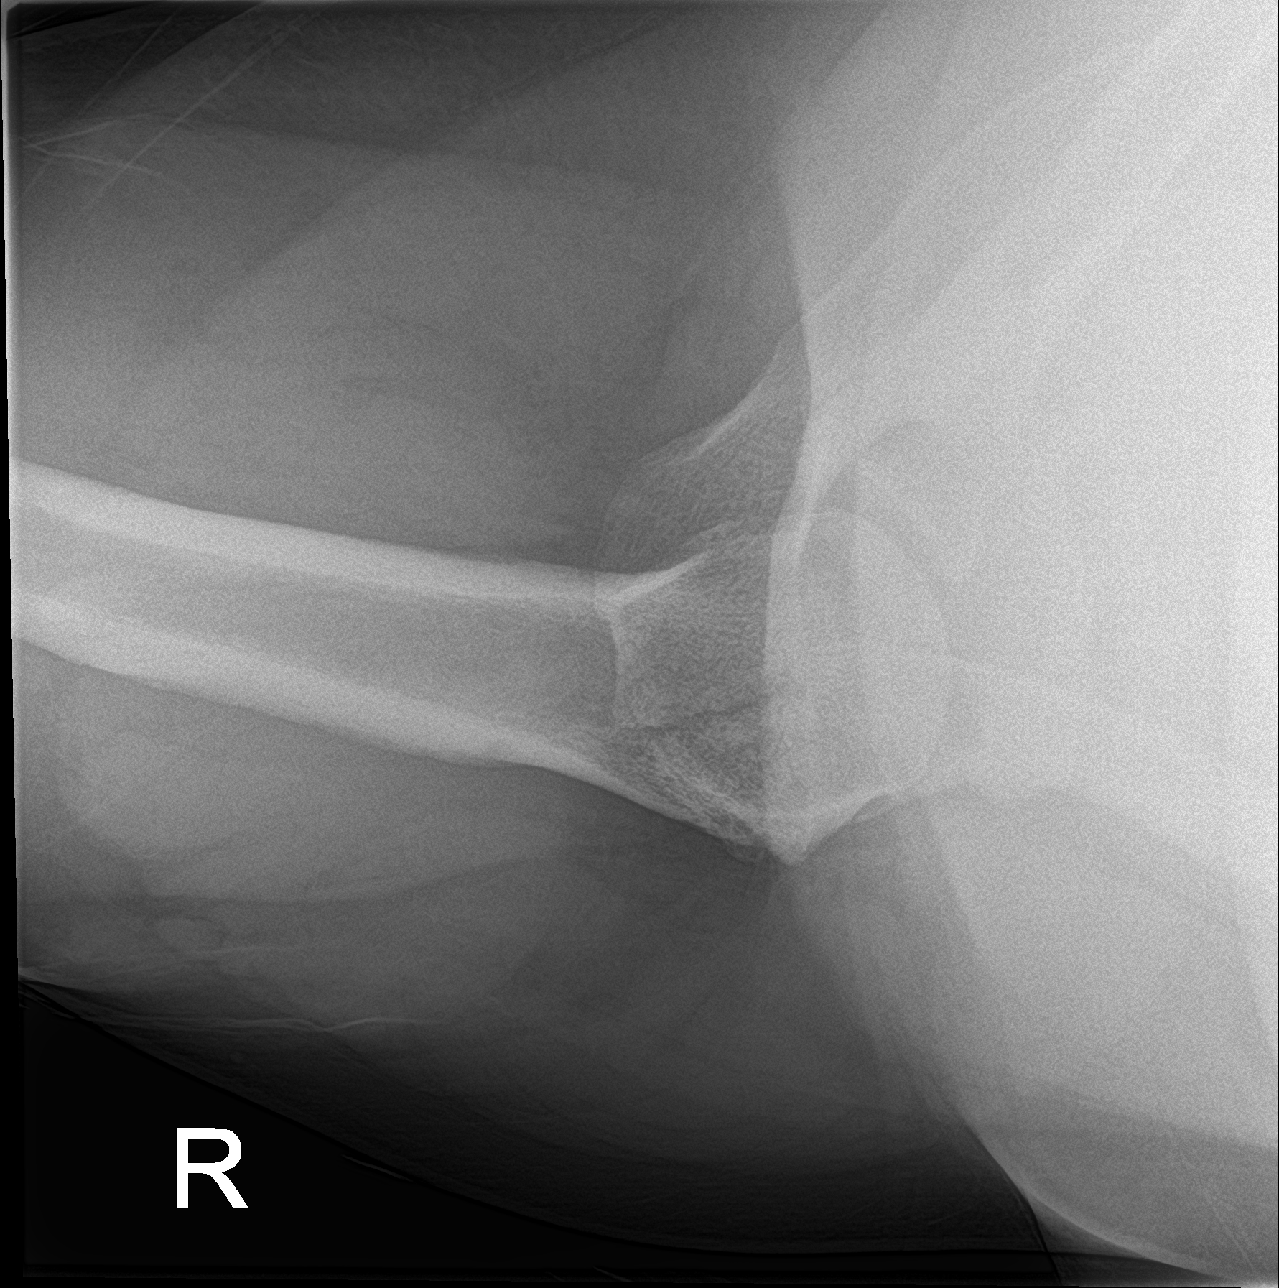

[3 of 3 positions shown; findings below may reference images not displayed]

FINDINGS: There is apparent age indeterminate fracture of the acromion, likely
acute. Clinical correlation is recommended. CT may provide better
evaluation. There is no dislocation. The bones are well mineralized.
No arthritic changes. The soft tissues are unremarkable.
IMPRESSION: Age indeterminate, likely acute, fracture of the acromion. CT may
provide better evaluation.

## 2022-06-13 DIAGNOSIS — Z0289 Encounter for other administrative examinations: Secondary | ICD-10-CM

## 2022-06-27 ENCOUNTER — Ambulatory Visit (INDEPENDENT_AMBULATORY_CARE_PROVIDER_SITE_OTHER): Payer: No Typology Code available for payment source | Admitting: Family Medicine

## 2022-06-27 ENCOUNTER — Encounter (INDEPENDENT_AMBULATORY_CARE_PROVIDER_SITE_OTHER): Payer: Self-pay | Admitting: Family Medicine

## 2022-06-27 VITALS — BP 161/92 | HR 78 | Temp 98.4°F | Ht 72.0 in | Wt 326.0 lb

## 2022-06-27 DIAGNOSIS — I4891 Unspecified atrial fibrillation: Secondary | ICD-10-CM

## 2022-06-27 DIAGNOSIS — G4733 Obstructive sleep apnea (adult) (pediatric): Secondary | ICD-10-CM | POA: Diagnosis not present

## 2022-06-27 DIAGNOSIS — I10 Essential (primary) hypertension: Secondary | ICD-10-CM

## 2022-06-27 DIAGNOSIS — N1831 Chronic kidney disease, stage 3a: Secondary | ICD-10-CM

## 2022-06-27 DIAGNOSIS — R5383 Other fatigue: Secondary | ICD-10-CM

## 2022-06-27 DIAGNOSIS — F32A Depression, unspecified: Secondary | ICD-10-CM | POA: Insufficient documentation

## 2022-06-27 DIAGNOSIS — Z6841 Body Mass Index (BMI) 40.0 and over, adult: Secondary | ICD-10-CM

## 2022-06-27 DIAGNOSIS — R0602 Shortness of breath: Secondary | ICD-10-CM | POA: Diagnosis not present

## 2022-06-27 DIAGNOSIS — Z8673 Personal history of transient ischemic attack (TIA), and cerebral infarction without residual deficits: Secondary | ICD-10-CM

## 2022-06-27 DIAGNOSIS — Z1331 Encounter for screening for depression: Secondary | ICD-10-CM

## 2022-06-27 DIAGNOSIS — F3289 Other specified depressive episodes: Secondary | ICD-10-CM | POA: Diagnosis not present

## 2022-07-01 LAB — COMPREHENSIVE METABOLIC PANEL
ALT: 39 IU/L (ref 0–44)
AST: 46 IU/L — ABNORMAL HIGH (ref 0–40)
Albumin/Globulin Ratio: 1.7 (ref 1.2–2.2)
Albumin: 4.6 g/dL (ref 4.1–5.1)
Alkaline Phosphatase: 46 IU/L (ref 44–121)
BUN/Creatinine Ratio: 17 (ref 9–20)
BUN: 29 mg/dL — ABNORMAL HIGH (ref 6–24)
Bilirubin Total: 0.5 mg/dL (ref 0.0–1.2)
CO2: 23 mmol/L (ref 20–29)
Calcium: 9.7 mg/dL (ref 8.7–10.2)
Chloride: 104 mmol/L (ref 96–106)
Creatinine, Ser: 1.75 mg/dL — ABNORMAL HIGH (ref 0.76–1.27)
Globulin, Total: 2.7 g/dL (ref 1.5–4.5)
Glucose: 86 mg/dL (ref 70–99)
Potassium: 4.4 mmol/L (ref 3.5–5.2)
Sodium: 141 mmol/L (ref 134–144)
Total Protein: 7.3 g/dL (ref 6.0–8.5)
eGFR: 50 mL/min/{1.73_m2} — ABNORMAL LOW (ref >59)

## 2022-07-01 LAB — LIPID PANEL
Chol/HDL Ratio: 2.5 ratio (ref 0.0–5.0)
Cholesterol, Total: 122 mg/dL (ref 100–199)
HDL: 48 mg/dL (ref >39)
LDL Chol Calc (NIH): 61 mg/dL (ref 0–99)
Triglycerides: 59 mg/dL (ref 0–149)
VLDL Cholesterol Cal: 13 mg/dL (ref 5–40)

## 2022-07-01 LAB — INSULIN, FREE AND TOTAL
Free Insulin: 7.5 uU/mL
Total Insulin: 7.5 uU/mL

## 2022-07-01 LAB — VITAMIN B12: Vitamin B-12: 1186 pg/mL (ref 232–1245)

## 2022-07-01 LAB — VITAMIN D 25 HYDROXY (VIT D DEFICIENCY, FRACTURES): Vit D, 25-Hydroxy: 33.6 ng/mL (ref 30.0–100.0)

## 2022-07-05 ENCOUNTER — Other Ambulatory Visit: Payer: Self-pay | Admitting: Family Medicine

## 2022-07-09 NOTE — Progress Notes (Unsigned)
Chief Complaint:   OBESITY Thomas [name] (MR# [id number]) is a 41 y.o. male who presents for evaluation and treatment of obesity and related comorbidities. Current BMI is Body mass index is 44.21 kg/m. Thomas [name] has been struggling with his weight for many years and has been unsuccessful in either losing weight, maintaining weight loss, or reaching his healthy weight goal.  Thomas [name] was <300 pounds in 2017.  Changed jobs, was less active.  Trying to eat breakfast.  Sometimes skips lunch.  Tends to snack when bored.  No big cravings for sugar.  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  Lives at home with his wife, and wife is supportive and they are expecting their first baby.  Singleton is currently in the action stage of change and ready to dedicate time achieving and maintaining a healthier weight. Skylen is interested in becoming our patient and working on intensive lifestyle modifications including (but not limited to) diet and exercise for weight loss.  Nikalas's habits were reviewed today and are as follows: His family eats meals together, he thinks his family will eat healthier with him, his desired weight loss is 66 lbs, he has been heavy most of his life, he started gaining weight in 2002, his heaviest weight ever was 345 pounds, he has significant food cravings issues, he snacks frequently in the evenings, he skips meals frequently, he is frequently drinking liquids with calories, he frequently makes poor food choices, and he struggles with emotional eating.  Depression Screen Nassim's Food and Mood (modified PHQ-9) score was 14.     06/27/2022   10:30 AM  Depression screen PHQ 2/9  Decreased Interest 2  Down, Depressed, Hopeless 1  PHQ - 2 Score 3  Altered sleeping 3  Tired, decreased energy 3  Change in appetite 1  Feeling bad or failure about yourself  1  Trouble concentrating 2  Moving slowly or fidgety/restless 1  Suicidal thoughts 0   PHQ-9 Score 14  Difficult doing work/chores Very difficult   Subjective:   1. Fatigue, unspecified type Adonias admits to daytime somnolence and admits to waking up still tired. Patient has a history of symptoms of daytime fatigue and morning fatigue. Masayoshi generally gets 4 hours of sleep per night, and states that he has nightime awakenings. Snoring is present. Apneic episodes are present. Epworth Sleepiness Score is 15.   2. SOB (shortness of breath) on exertion Thomas [name] notes increasing shortness of breath with exercising and seems to be worsening over time with weight gain. He notes getting out of breath sooner with activity than he used to. This has not gotten worse recently. Alvia denies shortness of breath at rest or orthopnea.  3. History of stroke Thomas [name] has a history of stroke in January 2014 with some left arm deficit.  On Crestor 10 mg daily per PCP.  4. Atrial fibrillation, unspecified type (HCC) Thomas [name] is on Xarelto 20 mg daily. He is on rate control medications.  Symptomatic without chest pain or palpitations.  5. Uncontrolled hypertension Thomas [name]'s blood pressure is elevated today. He is on labetalol 200 mg 3 times daily, clonidine 0.3 mg patch weekly, hydralazine 100 mg 3 times daily, and Benicar 40 mg daily.  6. OSA (obstructive sleep apnea) Thomas [name] is awaiting CPAP fitting at the [place].  He complains of fatigue.  7. Other depression, emotional eating binges Thomas [name]'s PHQ-9 score is 14.  He is on BuSpar 10 mg 3 times daily.  He  has a good support system.  Assessment/Plan:   1. Fatigue, unspecified type Dimario does feel that his weight is causing his energy to be lower than it should be. Fatigue may be related to obesity, depression or many other causes. Labs will be ordered, and in the meanwhile, Thomas [name] will focus on self care including making healthy food choices, increasing physical activity and focusing on stress reduction.  - EKG 12-Lead - VITAMIN D 25 Hydroxy (Vit-D Deficiency,  Fractures) - Vitamin B12 - Insulin, Free and Total  2. SOB (shortness of breath) on exertion Thomas [name] does feel that he gets out of breath more easily that he used to when he exercises. Loki's shortness of breath appears to be obesity related and exercise induced. He has agreed to work on weight loss and gradually increase exercise to treat his exercise induced shortness of breath. Will continue to monitor closely.  3. History of stroke We will follow-up on FLP results.  Thomas [name] will continue his current medications.  4. Atrial fibrillation, unspecified type (HCC) Statham will continue his current medications per Dr. Antoine Poche.   5. Uncontrolled hypertension We will check labs today.  Evo will continue his current medications per Dr. Antoine Poche.   - Comprehensive metabolic panel - Lipid panel  6. OSA (obstructive sleep apnea) [name]  7. Other depression, emotional eating binges Thomas [name] will consider cognitive behavioral therapy with Dr. Dewaine Conger, our bariatric psychologist.  8. Depression screening Thomas [name] had a positive depression screening. Depression is commonly associated with obesity and often results in emotional eating behaviors. We will monitor this closely and work on CBT to help improve the non-hunger eating patterns. Referral to Psychology may be required if no improvement is seen as he continues in our clinic.  9. Class 3 severe obesity with serious comorbidity and body mass index (BMI) of 40.0 to 44.9 in adult, unspecified obesity type (HCC) Thomas [name] is currently in the action stage of change and his goal is to continue with weight loss efforts. I recommend Thomas [name] begin the structured treatment plan as follows:  He has agreed to the Category 3 Plan.  Exercise goals: Add resistance training 3 times per week.  Behavioral modification strategies: increasing lean protein intake, increasing water intake, decreasing eating out, no skipping meals, better  snacking choices, and decreasing junk food.  He was informed of the importance of frequent follow-up visits to maximize his success with intensive lifestyle modifications for his multiple health conditions. He was informed we would discuss his lab results at his next visit unless there is a critical issue that needs to be addressed sooner. Thomas [name] agreed to keep his next visit at the agreed upon time to discuss these results.  Objective:   Blood pressure (!) 161/92, pulse 78, temperature 98.4 F (36.9 C), height 6' (1.829 m), weight (!) 326 lb (147.9 kg), SpO2 95 %. Body mass index is 44.21 kg/m.  EKG: Normal sinus rhythm, rate 61 BPM.  Indirect Calorimeter completed today shows a VO2 of 251 and a REE of 1728.  His calculated basal metabolic rate is 1517 thus his basal metabolic rate is worse than expected.  General: Cooperative, alert, well developed, in no acute distress. HEENT: Conjunctivae and lids unremarkable. Cardiovascular: Regular rhythm.  Lungs: Normal work of breathing. Neurologic: No focal deficits.   Lab Results  Component Value Date   CREATININE 1.75 (H) 06/27/2022   BUN 29 (H) 06/27/2022   NA 141 06/27/2022   K 4.4 06/27/2022  CL 104 06/27/2022   CO2 23 06/27/2022   Lab Results  Component Value Date   ALT 39 06/27/2022   AST 46 (H) 06/27/2022   ALKPHOS 46 06/27/2022   BILITOT 0.5 06/27/2022   Lab Results  Component Value Date   HGBA1C 5.6 03/29/2022   HGBA1C 5.4 04/09/2021   HGBA1C 5.7 05/10/2019   HGBA1C 5.8 05/21/2016   HGBA1C 5.7 02/22/2015   No results found for: "INSULIN" Lab Results  Component Value Date   TSH 1.15 03/29/2022   Lab Results  Component Value Date   CHOL 122 06/27/2022   HDL 48 06/27/2022   LDLCALC 61 06/27/2022   TRIG 59 06/27/2022   CHOLHDL 2.5 06/27/2022   Lab Results  Component Value Date   WBC 3.5 (L) 03/29/2022   HGB 14.0 03/29/2022   HCT 43.0 03/29/2022   MCV 88.7 03/29/2022   PLT 140 03/29/2022   No results  found for: "IRON", "TIBC", "FERRITIN"  Attestation Statements:   Reviewed by clinician on day of visit: allergies, medications, problem list, medical history, surgical history, family history, social history, and previous encounter notes.  Trude Mcburney, am acting as transcriptionist for Seymour Bars, DO.  I have reviewed the above documentation for accuracy and completeness, and I agree with the above. Glennis Brink, DO

## 2022-07-10 ENCOUNTER — Encounter (INDEPENDENT_AMBULATORY_CARE_PROVIDER_SITE_OTHER): Payer: Self-pay

## 2022-07-10 ENCOUNTER — Encounter (INDEPENDENT_AMBULATORY_CARE_PROVIDER_SITE_OTHER): Payer: Self-pay | Admitting: Family Medicine

## 2022-07-10 ENCOUNTER — Ambulatory Visit (INDEPENDENT_AMBULATORY_CARE_PROVIDER_SITE_OTHER): Payer: No Typology Code available for payment source | Admitting: Family Medicine

## 2022-07-10 VITALS — BP 146/86 | HR 61 | Temp 98.3°F | Ht 72.0 in | Wt 328.0 lb

## 2022-07-10 DIAGNOSIS — F3289 Other specified depressive episodes: Secondary | ICD-10-CM

## 2022-07-10 DIAGNOSIS — I4891 Unspecified atrial fibrillation: Secondary | ICD-10-CM | POA: Diagnosis not present

## 2022-07-10 DIAGNOSIS — Z6841 Body Mass Index (BMI) 40.0 and over, adult: Secondary | ICD-10-CM

## 2022-07-10 DIAGNOSIS — G4739 Other sleep apnea: Secondary | ICD-10-CM

## 2022-07-10 DIAGNOSIS — E669 Obesity, unspecified: Secondary | ICD-10-CM

## 2022-07-10 DIAGNOSIS — E7849 Other hyperlipidemia: Secondary | ICD-10-CM

## 2022-07-10 DIAGNOSIS — N183 Chronic kidney disease, stage 3 unspecified: Secondary | ICD-10-CM

## 2022-07-10 DIAGNOSIS — E559 Vitamin D deficiency, unspecified: Secondary | ICD-10-CM

## 2022-07-10 DIAGNOSIS — I10 Essential (primary) hypertension: Secondary | ICD-10-CM

## 2022-07-10 DIAGNOSIS — R7401 Elevation of levels of liver transaminase levels: Secondary | ICD-10-CM

## 2022-07-11 ENCOUNTER — Ambulatory Visit (INDEPENDENT_AMBULATORY_CARE_PROVIDER_SITE_OTHER): Payer: No Typology Code available for payment source | Admitting: Family Medicine

## 2022-07-22 NOTE — Progress Notes (Signed)
Chief Complaint:   OBESITY Thomas [name] is here to discuss his progress with his obesity treatment plan along with follow-up of his obesity related diagnoses. [name] is on the Category 3 Plan and states he is following his eating plan approximately 30% of the time. [name] states he is walking.  Today's visit was #: 2 Starting weight: 326 lbs Starting date: 06/27/2022 Today's weight: 328 lbs Today's date: 07/10/2022 Total lbs lost to date: 0 Total lbs lost since last in-office visit: 0  Interim History: [name] has done better with increasing his water intake and has done some walking.  He has tried Pharmacist, community spoon cereal.  Was eating out more with his grandmother's funeral.  His wife is pregnant and they are getting ready to move.  Subjective:   1. Stage 3 chronic kidney disease, unspecified whether stage 3a or 3b CKD (HCC) Thomas [name]'s GFR is 50, CRL 75 on 06/27/2022 Labs, stable.  He denies edema.  I discussed labs with the patient today.  2. Atrial fibrillation, unspecified type (HCC) [name] is on Xarelto 20 mg once daily, diltiazem and labetolol for rate control.  He is asymptomatic and is managed by Dr. Antoine Poche.   3. Other depression, emotional eating  Thomas [name]'s PHQ-9 score was 14 on 06/27/2022.  He is on BuSpar, and he has good support and has not been stress eating.  4. Vitamin D deficiency Thomas [name]'s vitamin D level was 33.6 on his labs on 06/27/2022.  He is on vitamin D OTC 1000 units daily.  5. Essential hypertension Thomas [name]'s blood pressure is slightly elevated on clonidine patch (weekly), diltiazem 24-hour 360 mg once daily, Inspra 25 mg, hydralazine 100 mg 3 times daily, and labetalol 200 mg 3 times daily per cardiology.  6. Other sleep apnea Thomas [name] is awaiting CPAP from the [place]. He is aiming for 7-8 hours of sleep nightly.  7. Elevated AST (SGOT) Thomas [name]'s AST was mildly elevated at 46 on 06/27/2022 Labs, reviewed.  He denies excess ETOH or Tylenol use.  8. Other hyperlipidemia Thomas [name]'s FLP was  at goal on his labs on 06/27/2022.  He is on Crestor 10 mg tablets.  Assessment/Plan:   1. Stage 3 chronic kidney disease, unspecified whether stage 3a or 3b CKD (HCC) [name] has a routine follow-up scheduled with nephrology.  2. Atrial fibrillation, unspecified type (HCC) [name] will continue his current medications per cardiology, and he is to avoid the use of stimulants.  3. Other depression, emotional eating  [name] will continue practicing mindful eating.  4. Vitamin D deficiency [name] agreed to increase OTC vitamin D to 4000 units daily.  5. Essential hypertension [name] will continue all of his blood pressure medications per cardiology.  Look for blood pressure improvement with weight loss and the use of CPAP.  6. Other sleep apnea [name] will start his CPAP nightly.   7. Elevated AST (SGOT) We will recheck [name]'s labs in 6 months.   8. Other hyperlipidemia [name] will continue his Crestor as directed.   9. Obesity, Current BMI 44.6 Thomas [name] is currently in the action stage of change. As such, his goal is to continue with weight loss efforts. He has agreed to the Category 3 Plan and keeping a food journal and adhering to recommended goals of 1600 calories and 110 grams of protein daily.   We discussed potentially adding Saxenda at his next visit.  Additional food options were given/handouts.  Exercise goals: As is.   Behavioral modification strategies: increasing lean protein intake, increasing water intake, decreasing  eating out, no skipping meals, better snacking choices, planning for success, and decreasing junk food.  Thomas [name] has agreed to follow-up with our clinic in 3 weeks. He was informed of the importance of frequent follow-up visits to maximize his success with intensive lifestyle modifications for his multiple health conditions.   Objective:   Blood pressure (!) 146/86, pulse 61, temperature 98.3 F (36.8 C), height 6' (1.829 m), weight (!) 328 lb (148.8 kg), SpO2 98  %. Body mass index is 44.48 kg/m.  General: Cooperative, alert, well developed, in no acute distress. HEENT: Conjunctivae and lids unremarkable. Cardiovascular: Regular rhythm.  Lungs: Normal work of breathing. Neurologic: No focal deficits.   Lab Results  Component Value Date   CREATININE 1.75 (H) 06/27/2022   BUN 29 (H) 06/27/2022   NA 141 06/27/2022   K 4.4 06/27/2022   CL 104 06/27/2022   CO2 23 06/27/2022   Lab Results  Component Value Date   ALT 39 06/27/2022   AST 46 (H) 06/27/2022   ALKPHOS 46 06/27/2022   BILITOT 0.5 06/27/2022   Lab Results  Component Value Date   HGBA1C 5.6 03/29/2022   HGBA1C 5.4 04/09/2021   HGBA1C 5.7 05/10/2019   HGBA1C 5.8 05/21/2016   HGBA1C 5.7 02/22/2015   No results found for: "INSULIN" Lab Results  Component Value Date   TSH 1.15 03/29/2022   Lab Results  Component Value Date   CHOL 122 06/27/2022   HDL 48 06/27/2022   LDLCALC 61 06/27/2022   TRIG 59 06/27/2022   CHOLHDL 2.5 06/27/2022   Lab Results  Component Value Date   VD25OH 33.6 06/27/2022   Lab Results  Component Value Date   WBC 3.5 (L) 03/29/2022   HGB 14.0 03/29/2022   HCT 43.0 03/29/2022   MCV 88.7 03/29/2022   PLT 140 03/29/2022   No results found for: "IRON", "TIBC", "FERRITIN"  Attestation Statements:   Reviewed by clinician on day of visit: allergies, medications, problem list, medical history, surgical history, family history, social history, and previous encounter notes.   Trude Mcburney, am acting as transcriptionist for Seymour Bars, DO.  I have reviewed the above documentation for accuracy and completeness, and I agree with the above. Glennis Brink, DO

## 2022-07-31 ENCOUNTER — Other Ambulatory Visit: Payer: Self-pay | Admitting: Family Medicine

## 2022-07-31 ENCOUNTER — Ambulatory Visit (INDEPENDENT_AMBULATORY_CARE_PROVIDER_SITE_OTHER): Payer: No Typology Code available for payment source | Admitting: Family Medicine

## 2022-07-31 ENCOUNTER — Encounter (INDEPENDENT_AMBULATORY_CARE_PROVIDER_SITE_OTHER): Payer: Self-pay

## 2022-07-31 ENCOUNTER — Encounter (INDEPENDENT_AMBULATORY_CARE_PROVIDER_SITE_OTHER): Payer: Self-pay | Admitting: Family Medicine

## 2022-07-31 VITALS — BP 159/129 | HR 82 | Temp 97.4°F | Ht 72.0 in | Wt 330.0 lb

## 2022-07-31 DIAGNOSIS — Z9989 Dependence on other enabling machines and devices: Secondary | ICD-10-CM

## 2022-07-31 DIAGNOSIS — I10 Essential (primary) hypertension: Secondary | ICD-10-CM | POA: Diagnosis not present

## 2022-07-31 DIAGNOSIS — E559 Vitamin D deficiency, unspecified: Secondary | ICD-10-CM | POA: Diagnosis not present

## 2022-07-31 DIAGNOSIS — N183 Chronic kidney disease, stage 3 unspecified: Secondary | ICD-10-CM | POA: Diagnosis not present

## 2022-07-31 DIAGNOSIS — F3289 Other specified depressive episodes: Secondary | ICD-10-CM

## 2022-07-31 DIAGNOSIS — Z6841 Body Mass Index (BMI) 40.0 and over, adult: Secondary | ICD-10-CM

## 2022-07-31 DIAGNOSIS — E669 Obesity, unspecified: Secondary | ICD-10-CM

## 2022-07-31 DIAGNOSIS — G4733 Obstructive sleep apnea (adult) (pediatric): Secondary | ICD-10-CM

## 2022-08-11 NOTE — Progress Notes (Unsigned)
Chief Complaint:   OBESITY Thomas [name] is here to discuss his progress with his obesity treatment plan along with follow-up of his obesity related diagnoses. Thomas [name] is on  Category 3 Plan and keeping a food journal and adhering to recommended goals of 1600 calories and 110 grams of protein daily. and states he is following his eating plan approximately 40% of the time. Thomas [name] states he is moving, packing boxes 60 minutes 7 times per week.  Today's visit was #: 3 Starting weight: 326 lbs Starting date: 06/27/2022 Today's weight: 330 lbs Today's date: 07/31/2022 Total lbs lost to date: 0 Total lbs lost since last in-office visit: + 2 lbs  Interim History: In the middle of moving.  Doing better with food choices.  Liking cottage cheese and magic spoon.  Eating more veggies.  Getting in lots of steps with work and moving.  Added protein shake to coffee in the morning.  Feels good about changes.   Subjective:   1. Vitamin D deficiency Increased Vitamin D to 4,000 IU daily.  Last Vitamin D level 33.6.  2. OSA on CPAP Started CPAP one week ago.  Adjusting well to full face mask.  Managed by the [place].  Energy level improving.   3. Stage 3 chronic kidney disease, unspecified whether stage 3a or 3b CKD (HCC) Seeing nephrology at [facility] tomorrow for follow up. GFR stable, 56.   4. Essential hypertension Blood pressure high today.  Did not take all of his blood pressures medications.  Managed by cardiology and nephrology.  Discussed importance of taking all of his medications given history of stroke and CKD.    5. Other depression, emotional eating  Improved control with increased protein intake.  Stress levels do remain high with moving and pregnancy.     Assessment/Plan:   1. Vitamin D deficiency Recheck Vitamin D level in 2-3 months.    2. OSA on CPAP Continue CPAP nightly and aim for 8 hours of sleep.   3. Stage 3 chronic kidney disease, unspecified whether stage 3a or 3b CKD (HCC) Keep  upcoming Nephrology visit.   4. Essential hypertension Take all blood pressure medications as directed, keep working on weight loss.   5. Other depression, emotional eating  Continue to work on stress reduction and mindful eating.   6. Obesity,current BMI 44.8 Thomas [name] is currently in the action stage of change. As such, his goal is to continue with weight loss efforts. He has agreed to Category 3 Plan and keeping a food journal and adhering to recommended goals of 1600 calories and 110 grams of protein daily.   Exercise goals:  continue goal:  10,000 steps daily.  Add in resistance training at  gym at least 2 days per week.   Behavioral modification strategies: increasing lean protein intake, increasing vegetables, increasing water intake, decreasing eating out, no skipping meals, ways to avoid boredom eating, planning for success, and decreasing junk food.  Thomas [name] has agreed to follow-up with our clinic in 4 weeks. He was informed of the importance of frequent follow-up visits to maximize his success with intensive lifestyle modifications for his multiple health conditions.   Objective:   Blood pressure (!) 159/129, pulse 82, temperature (!) 97.4 F (36.3 C), height 6' (1.829 m), weight (!) 330 lb (149.7 kg), SpO2 100 %. Body mass index is 44.76 kg/m.  General: Cooperative, alert, well developed, in no acute distress. HEENT: Conjunctivae and lids unremarkable. Cardiovascular: Regular rhythm.  Lungs: Normal work of breathing. Neurologic:  No focal deficits.   Lab Results  Component Value Date   CREATININE 1.75 (H) 06/27/2022   BUN 29 (H) 06/27/2022   NA 141 06/27/2022   K 4.4 06/27/2022   CL 104 06/27/2022   CO2 23 06/27/2022   Lab Results  Component Value Date   ALT 39 06/27/2022   AST 46 (H) 06/27/2022   ALKPHOS 46 06/27/2022   BILITOT 0.5 06/27/2022   Lab Results  Component Value Date   HGBA1C 5.6 03/29/2022   HGBA1C 5.4 04/09/2021   HGBA1C 5.7 05/10/2019   HGBA1C  5.8 05/21/2016   HGBA1C 5.7 02/22/2015   No results found for: "INSULIN" Lab Results  Component Value Date   TSH 1.15 03/29/2022   Lab Results  Component Value Date   CHOL 122 06/27/2022   HDL 48 06/27/2022   LDLCALC 61 06/27/2022   TRIG 59 06/27/2022   CHOLHDL 2.5 06/27/2022   Lab Results  Component Value Date   VD25OH 33.6 06/27/2022   Lab Results  Component Value Date   WBC 3.5 (L) 03/29/2022   HGB 14.0 03/29/2022   HCT 43.0 03/29/2022   MCV 88.7 03/29/2022   PLT 140 03/29/2022   No results found for: "IRON", "TIBC", "FERRITIN"  Attestation Statements:   Reviewed by clinician on day of visit: allergies, medications, problem list, medical history, surgical history, family history, social history, and previous encounter notes.  I, Malcolm Metro, am acting as Energy manager for Seymour Bars, DO.  I have reviewed the above documentation for accuracy and completeness, and I agree with the above. -  ***

## 2022-08-27 ENCOUNTER — Other Ambulatory Visit: Payer: Self-pay | Admitting: Family Medicine

## 2022-08-29 ENCOUNTER — Ambulatory Visit (INDEPENDENT_AMBULATORY_CARE_PROVIDER_SITE_OTHER): Payer: No Typology Code available for payment source | Admitting: Family Medicine

## 2022-08-29 ENCOUNTER — Encounter (INDEPENDENT_AMBULATORY_CARE_PROVIDER_SITE_OTHER): Payer: Self-pay | Admitting: Family Medicine

## 2022-08-29 VITALS — BP 139/81 | HR 72 | Temp 98.0°F | Ht 72.0 in | Wt 327.0 lb

## 2022-08-29 DIAGNOSIS — E669 Obesity, unspecified: Secondary | ICD-10-CM | POA: Diagnosis not present

## 2022-08-29 DIAGNOSIS — Z6841 Body Mass Index (BMI) 40.0 and over, adult: Secondary | ICD-10-CM

## 2022-08-29 DIAGNOSIS — I10 Essential (primary) hypertension: Secondary | ICD-10-CM | POA: Diagnosis not present

## 2022-08-29 DIAGNOSIS — G4733 Obstructive sleep apnea (adult) (pediatric): Secondary | ICD-10-CM

## 2022-08-29 DIAGNOSIS — E559 Vitamin D deficiency, unspecified: Secondary | ICD-10-CM | POA: Diagnosis not present

## 2022-08-29 DIAGNOSIS — Z9989 Dependence on other enabling machines and devices: Secondary | ICD-10-CM

## 2022-08-29 MED ORDER — VITAMIN D (ERGOCALCIFEROL) 1.25 MG (50000 UNIT) PO CAPS: 50000.0000 [IU] | ORAL_CAPSULE | ORAL | 0 refills | Status: DC

## 2022-09-03 NOTE — Progress Notes (Unsigned)
Chief Complaint:   OBESITY Thomas [name] is here to discuss his progress with his obesity treatment plan along with follow-up of his obesity related diagnoses. Thomas [name] is on the Category 3 Plan and keeping a food journal and adhering to recommended goals of 1600 calories and 110 grams protein daily and states he is following his eating plan approximately 30% of the time. Thomas [name] states he has been doing some walking at work.  Today's visit was #: 4 Starting weight: 326 lbs Starting date: 06/27/2022 Today's weight: 327 lbs Today's date: 08/29/22 Total lbs lost to date: 0 Total lbs lost since last in-office visit: -3  Interim History: He moved into a new house with social gatherings.  He added in [employer] shakes.  He has Premier protein in the a.m. with a fig bar, Core Power shake midmorning, cafeteria lunch (taco salad, pizza, spanikopita), dinner at home.  Evening snacks-candied nuts.  Having vegetable/salad with dinner.  Subjective:   1. Vitamin D deficiency Vitamin D level 33, complains of fatigue; not taking OTC vitamin D daily.  2. OSA on CPAP Started CPAP nightly-getting used to mask.  3. Essential hypertension Blood pressure fairly well controlled. On multiple medications. Has complications of chronic kidney disease-stable.  Assessment/Plan:   1. Vitamin D deficiency Begin: - Vitamin D, Ergocalciferol, (DRISDOL) 1.25 MG (50000 UNIT) CAPS capsule; Take 1 capsule (50,000 Units total) by mouth every 7 (seven) days.  Dispense: 5 capsule; Refill: 0  2. OSA on CPAP Continue CPAP nightly and 7 to 8 hours of sleep at night. Look for improvements in energy levels.  3. Essential hypertension Continue active plan for weight loss and current blood pressure medications.  4. Obesity, current BMI 44.5 1.  Do not exceed two protein shakes per day. 2.  Reviewed easy to make meal options.  Thomas [name] is currently in the action stage of change. As such, his goal is to continue with weight  loss efforts. He has agreed to the Category 3 Plan and keeping a food journal and adhering to recommended goals of 1600 calories and 110 grams protein daily.  Exercise goals: Track steps with change to new job.  Behavioral modification strategies: increasing lean protein intake, increasing vegetables, increasing water intake, decreasing eating out, no skipping meals, keeping healthy foods in the home, better snacking choices, and decreasing junk food.  Thomas [name] has agreed to follow-up with our clinic in 3 weeks. He was informed of the importance of frequent follow-up visits to maximize his success with intensive lifestyle modifications for his multiple health conditions.   Objective:   Blood pressure 139/81, pulse 72, temperature 98 F (36.7 C), height 6' (1.829 m), weight (!) 327 lb (148.3 kg), SpO2 99 %. Body mass index is 44.35 kg/m.  General: Cooperative, alert, well developed, in no acute distress. HEENT: Conjunctivae and lids unremarkable. Cardiovascular: Regular rhythm.  Lungs: Normal work of breathing. Neurologic: No focal deficits.   Lab Results  Component Value Date   CREATININE 1.75 (H) 06/27/2022   BUN 29 (H) 06/27/2022   NA 141 06/27/2022   K 4.4 06/27/2022   CL 104 06/27/2022   CO2 23 06/27/2022   Lab Results  Component Value Date   ALT 39 06/27/2022   AST 46 (H) 06/27/2022   ALKPHOS 46 06/27/2022   BILITOT 0.5 06/27/2022   Lab Results  Component Value Date   HGBA1C 5.6 03/29/2022   HGBA1C 5.4 04/09/2021   HGBA1C 5.7 05/10/2019   HGBA1C 5.8 05/21/2016   HGBA1C  5.7 02/22/2015   No results found for: "INSULIN" Lab Results  Component Value Date   TSH 1.15 03/29/2022   Lab Results  Component Value Date   CHOL 122 06/27/2022   HDL 48 06/27/2022   LDLCALC 61 06/27/2022   TRIG 59 06/27/2022   CHOLHDL 2.5 06/27/2022   Lab Results  Component Value Date   VD25OH 33.6 06/27/2022   Lab Results  Component Value Date   WBC 3.5 (L) 03/29/2022   HGB 14.0  03/29/2022   HCT 43.0 03/29/2022   MCV 88.7 03/29/2022   PLT 140 03/29/2022   No results found for: "IRON", "TIBC", "FERRITIN"   Attestation Statements:   Reviewed by clinician on day of visit: allergies, medications, problem list, medical history, surgical history, family history, social history, and previous encounter notes.  I, Georgianne Fick, FNP, am acting as transcriptionist for Dr. Loyal Gambler.  I have reviewed the above documentation for accuracy and completeness, and I agree with the above. Dell Ponto, DO

## 2022-09-19 ENCOUNTER — Encounter: Payer: Self-pay | Admitting: Bariatrics

## 2022-09-19 ENCOUNTER — Ambulatory Visit (INDEPENDENT_AMBULATORY_CARE_PROVIDER_SITE_OTHER): Payer: No Typology Code available for payment source | Admitting: Bariatrics

## 2022-09-19 VITALS — BP 152/88 | HR 81 | Temp 97.7°F | Ht 72.0 in | Wt 323.0 lb

## 2022-09-19 DIAGNOSIS — E669 Obesity, unspecified: Secondary | ICD-10-CM | POA: Diagnosis not present

## 2022-09-19 DIAGNOSIS — E7849 Other hyperlipidemia: Secondary | ICD-10-CM | POA: Diagnosis not present

## 2022-09-19 DIAGNOSIS — I10 Essential (primary) hypertension: Secondary | ICD-10-CM

## 2022-09-19 DIAGNOSIS — E559 Vitamin D deficiency, unspecified: Secondary | ICD-10-CM | POA: Diagnosis not present

## 2022-09-19 DIAGNOSIS — Z6841 Body Mass Index (BMI) 40.0 and over, adult: Secondary | ICD-10-CM

## 2022-09-19 MED ORDER — VITAMIN D (ERGOCALCIFEROL) 1.25 MG (50000 UNIT) PO CAPS: 50000.0000 [IU] | ORAL_CAPSULE | ORAL | 0 refills | Status: DC

## 2022-09-29 ENCOUNTER — Other Ambulatory Visit: Payer: Self-pay | Admitting: Family Medicine

## 2022-09-29 NOTE — Progress Notes (Signed)
Chief Complaint:   OBESITY Thomas [name] is here to discuss his progress with his obesity treatment plan along with follow-up of his obesity related diagnoses. [place] is on the Category 2 Plan and keeping a food journal and adhering to recommended goals of 1600 calories and 110 grams of protein and states he is following his eating plan approximately 50% of the time. Thomas [name] states he is walking 8500 steps 5 times per week.    Today's visit was #: 5 Starting weight: 326 lbs Starting date: 06/27/2022 Today's weight: 323 lbs Today's date: 09/19/2022 Total lbs lost to date: 3 Total lbs lost since last in-office visit: 4  Interim History: Thomas [name] is down 4 pounds since his last visit.  He has been under some stress.  Subjective:   1. Vitamin D deficiency Thomas [name] is taking prescription vitamin D, and he notes his energy level has increased.  2. Essential hypertension Thomas [name] is taking Catapres, Cardizem, Tiazac, hydralazine, and Benicar. His blood pressure is slightly elevated today. He notes he took his blood pressure this morning.   3. Other hyperlipidemia Thomas [name] is taking Crestor.   Assessment/Plan:   1. Vitamin D deficiency We will refill prescription Vitamin D for 1 month. Thomas [name] will follow-up for routine testing of Vitamin D, at least 2-3 times per year to avoid over-replacement.  - Vitamin D, Ergocalciferol, (DRISDOL) 1.25 MG (50000 UNIT) CAPS capsule; Take 1 capsule (50,000 Units total) by mouth every 7 (seven) days.  Dispense: 5 capsule; Refill: 0  2. Essential hypertension Thomas [name] will continue his medications and continue to check his blood pressure at home, and he is to call his PCP if his blood pressure remains high.  3. Other hyperlipidemia Advait will continue his medications.  Handouts were given and discussed on healthy versus unhealthy fats.  4. Obesity, current BMI 43.8 Thomas [name] is currently in the action stage of change. As such, his goal is to continue with weight loss efforts.  He has agreed to the Category 3 Plan.   He will adhere closely to the plan 80-90%. Increase protein. Recipes for soup. Keep water intake high.   Exercise goals: As is.   Behavioral modification strategies: increasing lean protein intake, decreasing simple carbohydrates, increasing vegetables, increasing water intake, decreasing eating out, no skipping meals, meal planning and cooking strategies, keeping healthy foods in the home, and planning for success.  Thomas [name] has agreed to follow-up with our clinic in 2 to 3 weeks with Thomas Pacific, FNP-C. He was informed of the importance of frequent follow-up visits to maximize his success with intensive lifestyle modifications for his multiple health conditions.   Objective:   Blood pressure (!) 152/88, pulse 81, temperature 97.7 F (36.5 C), height 6' (1.829 m), weight (!) 323 lb (146.5 kg), SpO2 98 %. Body mass index is 43.81 kg/m.  General: Cooperative, alert, well developed, in no acute distress. HEENT: Conjunctivae and lids unremarkable. Cardiovascular: Regular rhythm.  Lungs: Normal work of breathing. Neurologic: No focal deficits.   Lab Results  Component Value Date   CREATININE 1.75 (H) 06/27/2022   BUN 29 (H) 06/27/2022   NA 141 06/27/2022   K 4.4 06/27/2022   CL 104 06/27/2022   CO2 23 06/27/2022   Lab Results  Component Value Date   ALT 39 06/27/2022   AST 46 (H) 06/27/2022   ALKPHOS 46 06/27/2022   BILITOT 0.5 06/27/2022   Lab Results  Component Value Date   HGBA1C 5.6 03/29/2022   HGBA1C 5.4 04/09/2021  HGBA1C 5.7 05/10/2019   HGBA1C 5.8 05/21/2016   HGBA1C 5.7 02/22/2015   No results found for: "INSULIN" Lab Results  Component Value Date   TSH 1.15 03/29/2022   Lab Results  Component Value Date   CHOL 122 06/27/2022   HDL 48 06/27/2022   LDLCALC 61 06/27/2022   TRIG 59 06/27/2022   CHOLHDL 2.5 06/27/2022   Lab Results  Component Value Date   VD25OH 33.6 06/27/2022   Lab Results  Component  Value Date   WBC 3.5 (L) 03/29/2022   HGB 14.0 03/29/2022   HCT 43.0 03/29/2022   MCV 88.7 03/29/2022   PLT 140 03/29/2022   No results found for: "IRON", "TIBC", "FERRITIN"  Attestation Statements:   Reviewed by clinician on day of visit: allergies, medications, problem list, medical history, surgical history, family history, social history, and previous encounter notes.   Wilhemena Durie, am acting as Location manager for [employer], DO.  I have reviewed the above documentation for accuracy and completeness, and I agree with the above. Jearld Lesch, DO

## 2022-09-29 NOTE — Progress Notes (Incomplete)
Cardiology Office Note   Date:  09/29/2022   ID:  Thomas Quant., DOB 06-09-1981, MRN [id number]  PCP:  Natalia Leatherwood, DO  Cardiologist:   Rollene Rotunda, MD    No chief complaint on file.      History of Present Illness: Thomas [name]. is a 41 y.o. male who presents for follow up of atrial fib, HTN and a CVA. He has been treated with tPA in past for his CVA.  He has had uncontrolled hypertension.     Since I last saw him ***   ***  his blood pressure is obtained in the 150s over 90s range.  He forgot to wear his patch today because he changed it yesterday.  But since increasing his clonidine patch with the other meds as listed he has done well. The patient denies any new symptoms such as chest discomfort, neck or arm discomfort. There has been no new shortness of breath, PND or orthopnea. There have been no reported palpitations, presyncope or syncope.  He says he does have a lot of stress.  He is about to buy a house.  He and his wife are going through IVF.  He says overall he feels well.  He is not noticing his atrial fibrillation.    Past Medical History:  Diagnosis Date   Anxiety    Atrial fibrillation (HCC) 10/29/2010   Back pain    Benign essential hypertension 12/15/2008   Bilateral knee pain 04/04/2021   Cardiomyopathy 11/28/2010   Constipation    CVA (cere[facility] vascular accident) [facility])    right CVA/hemiplegia   Gout    History of chicken pox    History of stroke    Kidney problem    Renal insufficiency 11/28/2010   Shortness of breath    Sleep apnea    Swelling of both lower extremities     No past surgical history on file.   Current Outpatient Medications  Medication Sig Dispense Refill   allopurinol (ZYLOPRIM) 100 MG tablet Take 1.5 tablets (150 mg total) by mouth daily. 135 tablet 3   allopurinol (ZYLOPRIM) 300 MG tablet TAKE ONE-HALF TABLET BY MOUTH DAILY FOR GOUT     busPIRone (BUSPAR) 10 MG tablet Take 10 mg by mouth 3 (three) times  daily.     cloNIDine (CATAPRES - DOSED IN MG/24 HR) 0.3 mg/24hr patch Place 1 patch (0.3 mg total) onto the skin once a week. 90 patch 1   diltiazem (CARDIZEM CD) 360 MG 24 hr capsule TAKE 1 CAPSULE BY MOUTH EVERY DAY 90 capsule 1   diltiazem (TIAZAC) 360 MG 24 hr capsule Take 360 mg by mouth daily.     eplerenone (INSPRA) 25 MG tablet Take 1 tablet (25 mg total) by mouth daily. 90 tablet 1   hydrALAZINE (APRESOLINE) 100 MG tablet Take 1 tablet (100 mg total) by mouth 3 (three) times daily. 270 tablet 1   labetalol (NORMODYNE) 200 MG tablet Take 1 tablet (200 mg total) by mouth 3 (three) times daily. 252 tablet 3   latanoprost (XALATAN) 0.005 % ophthalmic solution SMARTSIG:In Eye(s)     losartan (COZAAR) 100 MG tablet Take 100 mg by mouth daily.     olmesartan (BENICAR) 40 MG tablet Take 40 mg by mouth daily.     rivaroxaban (XARELTO) 20 MG TABS tablet Take 1 tablet (20 mg total) by mouth daily with supper. 90 tablet 3   rosuvastatin (CRESTOR) 10 MG tablet TAKE 1 TABLET BY  MOUTH IN THE EVENING ON MONDAY, WEDNESDAY AND FRIDAY AS DIRECTED 12 tablet 0   Vitamin D, Ergocalciferol, (DRISDOL) 1.25 MG (50000 UNIT) CAPS capsule Take 1 capsule (50,000 Units total) by mouth every 7 (seven) days. 5 capsule 0   No current facility-administered medications for this visit.    Allergies:   Benicar hct [olmesartan medoxomil-hctz] and Spironolactone    ROS:  Please see the history of present illness.   Otherwise, review of systems are positive for ***.   All other systems are reviewed and negative.    PHYSICAL EXAM: VS:  There were no vitals taken for this visit. , BMI There is no height or weight on file to calculate BMI.  GENERAL:  Well appearing NECK:  No jugular venous distention, waveform within normal limits, carotid upstroke brisk and symmetric, no bruits, no thyromegaly LUNGS:  Clear to auscultation bilaterally CHEST:  Unremarkable HEART:  PMI not displaced or sustained,S1 and S2 within normal  limits, no S3, no clicks, no rubs, *** murmurs, irregular ABD:  Flat, positive bowel sounds normal in frequency in pitch, no bruits, no rebound, no guarding, no midline pulsatile mass, no hepatomegaly, no splenomegaly EXT:  2 plus pulses throughout, no edema, no cyanosis no clubbing   ***GENERAL:  Well appearing NECK:  No jugular venous distention, waveform within normal limits, carotid upstroke brisk and symmetric, no bruits, no thyromegaly LUNGS:  Clear to auscultation bilaterally CHEST:  Unremarkable HEART:  PMI not displaced or sustained,S1 and S2 within normal limits, no S3, no clicks, no rubs, no murmurs, irregular  ABD:  Flat, positive bowel sounds normal in frequency in pitch, no bruits, no rebound, no guarding, no midline pulsatile mass, no hepatomegaly, no splenomegaly EXT:  2 plus pulses throughout, mild edema, no cyanosis no clubbing   EKG:  EKG is  *** ordered today. Atrial fibrillation, rate ***, axis within normal limits, intervals within normal limits, lateral T wave inversions consistent with repolarization changes.  Baseline artifact  Recent Labs: 03/29/2022: Hemoglobin 14.0; Platelets 140; TSH 1.15 06/27/2022: ALT 39; BUN 29; Creatinine, Ser 1.75; Potassium 4.4; Sodium 141    Lipid Panel    Component Value Date/Time   CHOL 122 06/27/2022 1115   TRIG 59 06/27/2022 1115   HDL 48 06/27/2022 1115   CHOLHDL 2.5 06/27/2022 1115   CHOLHDL 2 07/27/2021 0907   VLDL 11.0 07/27/2021 0907   LDLCALC 61 06/27/2022 1115   LDLCALC 82 04/09/2021 1417      Wt Readings from Last 3 Encounters:  09/19/22 (!) 323 lb (146.5 kg)  08/29/22 (!) 327 lb (148.3 kg)  07/31/22 (!) 330 lb (149.7 kg)      Other studies Reviewed: Additional studies/ records that were reviewed today include: *** Review of the above records demonstrates: ***  ASSESSMENT AND PLAN:  ATRIAL FIB:  [name] has a [name]2DS2 - VASc score of 3.   ***   He tolerates anticoagulation and has had good  rate control.  No change in therapy.  CVA:   He has some mild residual left arm weakness.  ***  He will continue his anticoagulation.  No change in therapy.  LVH : His last ejection fraction was 55 to 60%.   ***  This was mild on echo.  No change in therapy.   HTN:     ***   We had a long conversation about this.  He is on maximal therapy except probably for the addition of minoxidil.   I  could consider enrollment in a renal denervation trial.   I am going to refer him as below for weight loss.  He is also having a repeat sleep study though the one he had years ago was negative.  If he has weight loss and treatable sleep apnea perhaps he will have some improvement in his blood pressure is.   OBESITY:   ***  I had a long conversation with him today about this.  [facility] am [facility] to [facility].   Current medicines are reviewed at length with the patient today.  The patient does not have concerns regarding medicines.  The following changes have been made:   ***  Labs/ tests ordered today include:     ***  No orders of the defined types were placed in this encounter.   Disposition:   FU with me in *** months   Signed, Rollene Rotunda, MD  09/29/2022 8:35 PM    [facility]

## 2022-10-01 ENCOUNTER — Ambulatory Visit: Payer: No Typology Code available for payment source | Admitting: Cardiology

## 2022-10-01 DIAGNOSIS — I517 Cardiomegaly: Secondary | ICD-10-CM

## 2022-10-01 DIAGNOSIS — I482 Chronic atrial fibrillation, unspecified: Secondary | ICD-10-CM

## 2022-10-01 DIAGNOSIS — I10 Essential (primary) hypertension: Secondary | ICD-10-CM

## 2022-10-09 ENCOUNTER — Encounter: Payer: Self-pay | Admitting: Bariatrics

## 2022-10-09 ENCOUNTER — Ambulatory Visit (INDEPENDENT_AMBULATORY_CARE_PROVIDER_SITE_OTHER): Payer: No Typology Code available for payment source | Admitting: Nurse Practitioner

## 2022-10-09 ENCOUNTER — Encounter: Payer: Self-pay | Admitting: Nurse Practitioner

## 2022-10-09 VITALS — BP 139/84 | HR 70 | Temp 97.9°F | Ht 72.0 in | Wt 323.0 lb

## 2022-10-09 DIAGNOSIS — G4733 Obstructive sleep apnea (adult) (pediatric): Secondary | ICD-10-CM | POA: Diagnosis not present

## 2022-10-09 DIAGNOSIS — E669 Obesity, unspecified: Secondary | ICD-10-CM

## 2022-10-09 DIAGNOSIS — E559 Vitamin D deficiency, unspecified: Secondary | ICD-10-CM

## 2022-10-09 DIAGNOSIS — Z6841 Body Mass Index (BMI) 40.0 and over, adult: Secondary | ICD-10-CM | POA: Diagnosis not present

## 2022-10-09 MED ORDER — VITAMIN D (ERGOCALCIFEROL) 1.25 MG (50000 UNIT) PO CAPS: 50000.0000 [IU] | ORAL_CAPSULE | ORAL | 0 refills | Status: DC

## 2022-10-13 NOTE — Progress Notes (Unsigned)
Cardiology Office Note   Date:  10/14/2022   ID:  Thomas Quant., DOB Feb 19, 1981, MRN [id number]  PCP:  Natalia Leatherwood, DO  Cardiologist:   Rollene Rotunda, MD    Chief Complaint  Patient presents with   Atrial Fibrillation     History of Present Illness: Thomas Lienhard. is a 41 y.o. male who presents for follow up of atrial fib, HTN and a CVA. He has been treated with tPA in past for his CVA.  He has had uncontrolled hypertension.     Since I last saw him he has started working with [facility].  He has started CPAP.  He reports that his blood pressure at home is in the 130s over 80s.  He started walking a little bit for exercise.  He switched jobs.  His mother-in-law who has some health issues moving in with him and they are expecting a baby in February. Thomas [name])  The patient denies any new symptoms such as chest discomfort, neck or arm discomfort. There has been no new shortness of breath, PND or orthopnea. There have been no reported palpitations, presyncope or syncope.    Past Medical History:  Diagnosis Date   Anxiety    Atrial fibrillation (HCC) 10/29/2010   Back pain    Benign essential hypertension 12/15/2008   Bilateral knee pain 04/04/2021   Cardiomyopathy 11/28/2010   Constipation    CVA (cerebral vascular accident) [facility])    right CVA/hemiplegia   Gout    History of chicken pox    History of stroke    Kidney problem    Renal insufficiency 11/28/2010   Shortness of breath    Sleep apnea    Swelling of both lower extremities     No past surgical history on file.   Current Outpatient Medications  Medication Sig Dispense Refill   allopurinol (ZYLOPRIM) 100 MG tablet Take 1.5 tablets (150 mg total) by mouth daily. 135 tablet 3   allopurinol (ZYLOPRIM) 300 MG tablet TAKE ONE-HALF TABLET BY MOUTH DAILY FOR GOUT     busPIRone (BUSPAR) 10 MG tablet Take 10 mg by mouth 3 (three) times daily.     cloNIDine (CATAPRES - DOSED IN MG/24  HR) 0.3 mg/24hr patch Place 1 patch (0.3 mg total) onto the skin once a week. 90 patch 1   diltiazem (CARDIZEM CD) 360 MG 24 hr capsule TAKE 1 CAPSULE BY MOUTH EVERY DAY 90 capsule 1   diltiazem (TIAZAC) 360 MG 24 hr capsule Take 360 mg by mouth daily.     eplerenone (INSPRA) 25 MG tablet TAKE 1 TABLET (25 MG TOTAL) BY MOUTH DAILY. 30 tablet 0   hydrALAZINE (APRESOLINE) 100 MG tablet Take 1 tablet (100 mg total) by mouth 3 (three) times daily. 270 tablet 1   labetalol (NORMODYNE) 200 MG tablet Take 1 tablet (200 mg total) by mouth 3 (three) times daily. 252 tablet 3   latanoprost (XALATAN) 0.005 % ophthalmic solution SMARTSIG:In Eye(s)     losartan (COZAAR) 100 MG tablet Take 100 mg by mouth daily.     olmesartan (BENICAR) 40 MG tablet Take 40 mg by mouth daily.     rivaroxaban (XARELTO) 20 MG TABS tablet Take 1 tablet (20 mg total) by mouth daily with supper. 90 tablet 3   rosuvastatin (CRESTOR) 10 MG tablet TAKE 1 TABLET BY MOUTH IN THE EVENING ON MONDAY, WEDNESDAY AND FRIDAY AS DIRECTED 12 tablet 0   Vitamin D, Ergocalciferol, (  DRISDOL) 1.25 MG (50000 UNIT) CAPS capsule Take 1 capsule (50,000 Units total) by mouth every 7 (seven) days. 5 capsule 0   No current facility-administered medications for this visit.    Allergies:   Benicar hct [olmesartan medoxomil-hctz] and Spironolactone    ROS:  Please see the history of present illness.   Otherwise, review of systems are positive for none.   All other systems are reviewed and negative.    PHYSICAL EXAM: VS:  BP (!) 158/106   Pulse 88   Ht 6\' 4"  (1.93 m)   Wt (!) 338 lb 6.4 oz (153.5 kg)   SpO2 100%   BMI 41.19 kg/m  , BMI Body mass index is 41.19 kg/m.  GENERAL:  Well appearing NECK:  No jugular venous distention, waveform within normal limits, carotid upstroke brisk and symmetric, no bruits, no thyromegaly LUNGS:  Clear to auscultation bilaterally CHEST:  Unremarkable HEART:  PMI not displaced or sustained,S1 and S2 within normal  limits, no S3, no clicks, no rubs, no murmurs, irregular ABD:  Flat, positive bowel sounds normal in frequency in pitch, no bruits, no rebound, no guarding, no midline pulsatile mass, no hepatomegaly, no splenomegaly EXT:  2 plus pulses throughout, no edema, no cyanosis no clubbing   EKG:  EKG is  not ordered today.  Recent Labs: 03/29/2022: Hemoglobin 14.0; Platelets 140; TSH 1.15 06/27/2022: ALT 39; BUN 29; Creatinine, Ser 1.75; Potassium 4.4; Sodium 141    Lipid Panel    Component Value Date/Time   CHOL 122 06/27/2022 1115   TRIG 59 06/27/2022 1115   HDL 48 06/27/2022 1115   CHOLHDL 2.5 06/27/2022 1115   CHOLHDL 2 07/27/2021 0907   VLDL 11.0 07/27/2021 0907   LDLCALC 61 06/27/2022 1115   LDLCALC 82 04/09/2021 1417      Wt Readings from Last 3 Encounters:  10/14/22 (!) 338 lb 6.4 oz (153.5 kg)  10/09/22 (!) 323 lb (146.5 kg)  09/19/22 (!) 323 lb (146.5 kg)      Other studies Reviewed: Additional studies/ records that were reviewed today include: None Review of the above records demonstrates: NA  ASSESSMENT AND PLAN:  ATRIAL FIB:  [name] has a [name]2DS2 - VASc score of 3.   He tolerates anticoagulation.  No change in therapy.  CVA:   He has some mild residual left arm weakness.  Continue with risk reduction.  LVH : His last ejection fraction was 55 to 60%.   I will follow this up with repeat echoes in the future.   HTN:     He is on good medical therapy.  I think he will come down hopefully with treatment of his sleep apnea which is just started and I suspect he will be on some weight loss medications in the future through [facility].  I will check with Dr. Beryle Quant to see that he does not qualify for any clinical trials.    OBESITY:   He thinks he is losing weight by his scales and he will continue to work with the group as above.    Current medicines are reviewed at length with the patient today.  The patient does not have concerns  regarding medicines.  The following changes have been made:   None  Labs/ tests ordered today include:     None  No orders of the defined types were placed in this encounter.   Disposition:   FU with me in 6 months   Signed,  Rollene Rotunda, MD  10/14/2022 8:57 AM    [facility]

## 2022-10-14 ENCOUNTER — Ambulatory Visit: Payer: No Typology Code available for payment source | Attending: Cardiology | Admitting: Cardiology

## 2022-10-14 ENCOUNTER — Encounter: Payer: Self-pay | Admitting: Cardiology

## 2022-10-14 VITALS — BP 158/106 | HR 88 | Ht 76.0 in | Wt 338.4 lb

## 2022-10-14 DIAGNOSIS — I10 Essential (primary) hypertension: Secondary | ICD-10-CM

## 2022-10-14 DIAGNOSIS — I482 Chronic atrial fibrillation, unspecified: Secondary | ICD-10-CM | POA: Diagnosis not present

## 2022-10-14 NOTE — Patient Instructions (Signed)
Medication Instructions:  Your physician recommends that you continue on your current medications as directed. Please refer to the Current Medication list given to you today.  *If you need a refill on your cardiac medications before your next appointment, please call your pharmacy*   Lab Work: NONE ordered at this time of appointment   If you have labs (blood work) drawn today and your tests are completely normal, you will receive your results only by: MyChart Message (if you have MyChart) OR A paper copy in the mail If you have any lab test that is abnormal or we need to change your treatment, we will call you to review the results.   Testing/Procedures: NONE ordered at this time of appointment   Follow-Up: At [facility], you and your health needs are our priority.  As part of our continuing mission to provide you with exceptional heart care, we have created designated Provider Care Teams.  These Care Teams include your primary Cardiologist (physician) and Advanced Practice Providers (APPs -  Physician Assistants and Nurse Practitioners) who all work together to provide you with the care you need, when you need it.   Your next appointment:   6 month(s)  The format for your next appointment:   In Person  Provider:   James Hochrein, MD     Other Instructions  Important Information About Sugar       

## 2022-10-15 NOTE — Progress Notes (Signed)
Chief Complaint:   OBESITY Thomas [name] is here to discuss his progress with his obesity treatment plan along with follow-up of his obesity related diagnoses. Thomas [name] is on the Category 4 Plan and states he is following his eating plan approximately 40% of the time. Thomas [name] states he is weight lifting 45 minutes 3-4 times per week.  Today's visit was #: 6 Starting weight: 326 lbs Starting date: 06/27/2022 Today's weight: 323 lbs Today's date: 10/09/2022 Total lbs lost to date: 3 lbs Total lbs lost since last in-office visit: 0  Interim History: Thomas [name] has a lot going on, his mother in law moved in with him and his wife on 11/02, his wife is pregnant and due in Feb and moved into a new house Sept 9th. Denies stress eating. Does not eat consistently. Skips meals. Aiming to drink more water.  Drinks Core powder protein shakes and Premier protein shakes daily.  Subjective:   1. Vitamin D deficiency Thomas [name] is currently taking prescription Vit D 50,000 IU once a week. Denies any nausea, vomiting or muscle weakness.  2. OSA on CPAP Thomas [name] is wearing CPAP nightly. Wakes up feeling refreshed.  Assessment/Plan:   1. Vitamin D deficiency We will refill Vit D 50,000 IU once a week for 1 month with 0 refills. Low Vitamin D level contributes to fatigue and are associated with obesity, breast, and colon cancer. He agrees to continue to take prescription Vitamin D @50 ,000 IU every week and will follow-up for routine testing of Vitamin D, at least 2-3 times per year to avoid over-replacement.   -Refill Vitamin D, Ergocalciferol, (DRISDOL) 1.25 MG (50000 UNIT) CAPS capsule; Take 1 capsule (50,000 Units total) by mouth every 7 (seven) days.  Dispense: 5 capsule; Refill: 0  2. OSA on CPAP Continue wearing CPAP at night.  3. Obesity, current BMI 43.9 Thomas [name] is currently in the action stage of change. As such, his goal is to continue with weight loss efforts. He has agreed to the Category 4 Plan.   Exercise  goals: As is.  Behavioral modification strategies: increasing lean protein intake, increasing vegetables, increasing water intake, and holiday eating strategies .  Thomas [name] has agreed to follow-up with our clinic in 4 weeks. He was informed of the importance of frequent follow-up visits to maximize his success with intensive lifestyle modifications for his multiple health conditions.   Objective:   Blood pressure 139/84, pulse 70, temperature 97.9 F (36.6 C), temperature source Oral, height 6' (1.829 m), weight (!) 323 lb (146.5 kg), SpO2 96 %. Body mass index is 43.81 kg/m.  General: Cooperative, alert, well developed, in no acute distress. HEENT: Conjunctivae and lids unremarkable. Cardiovascular: Regular rhythm.  Lungs: Normal work of breathing. Neurologic: No focal deficits.   Lab Results  Component Value Date   CREATININE 1.75 (H) 06/27/2022   BUN 29 (H) 06/27/2022   NA 141 06/27/2022   K 4.4 06/27/2022   CL 104 06/27/2022   CO2 23 06/27/2022   Lab Results  Component Value Date   ALT 39 06/27/2022   AST 46 (H) 06/27/2022   ALKPHOS 46 06/27/2022   BILITOT 0.5 06/27/2022   Lab Results  Component Value Date   HGBA1C 5.6 03/29/2022   HGBA1C 5.4 04/09/2021   HGBA1C 5.7 05/10/2019   HGBA1C 5.8 05/21/2016   HGBA1C 5.7 02/22/2015   No results found for: "INSULIN" Lab Results  Component Value Date   TSH 1.15 03/29/2022   Lab Results  Component Value Date  CHOL 122 06/27/2022   HDL 48 06/27/2022   LDLCALC 61 06/27/2022   TRIG 59 06/27/2022   CHOLHDL 2.5 06/27/2022   Lab Results  Component Value Date   VD25OH 33.6 06/27/2022   Lab Results  Component Value Date   WBC 3.5 (L) 03/29/2022   HGB 14.0 03/29/2022   HCT 43.0 03/29/2022   MCV 88.7 03/29/2022   PLT 140 03/29/2022   No results found for: "IRON", "TIBC", "FERRITIN"  Attestation Statements:   Reviewed by clinician on day of visit: allergies, medications, problem list, medical history, surgical  history, family history, social history, and previous encounter notes.  I, Brendell Tyus, RMA, am acting as transcriptionist for Irene Limbo, FNP.  I have reviewed the above documentation for accuracy and completeness, and I agree with the above. Irene Limbo, FNP

## 2022-10-25 ENCOUNTER — Other Ambulatory Visit: Payer: Self-pay | Admitting: Cardiology

## 2022-10-30 ENCOUNTER — Other Ambulatory Visit: Payer: Self-pay | Admitting: Family Medicine

## 2022-11-03 ENCOUNTER — Other Ambulatory Visit: Payer: Self-pay | Admitting: Family Medicine

## 2022-11-04 ENCOUNTER — Other Ambulatory Visit: Payer: Self-pay

## 2022-11-13 ENCOUNTER — Ambulatory Visit: Payer: No Typology Code available for payment source | Admitting: Bariatrics

## 2022-12-26 ENCOUNTER — Other Ambulatory Visit: Payer: Self-pay | Admitting: Family Medicine

## 2023-04-13 NOTE — Progress Notes (Unsigned)
  Cardiology Office Note:   Date:  04/14/2023  ID:  [name], DOB Jun 13, 1981, MRN [id number]  History of Present Illness:   Thomas [name]. is a 42 y.o. male who presents for follow up of atrial fib, HTN and a CVA. He has been treated with tPA in past for his CVA.  He has had uncontrolled hypertension.     Since I last saw him he had his son who is now 23 months old.  His name is Thomas [name].  The patient has done well.  The patient denies any new symptoms such as chest discomfort, neck or arm discomfort. There has been no new shortness of breath, PND or orthopnea. There have been no reported palpitations, presyncope or syncope.    ROS: As stated in the HPI and negative for all other systems.  Studies Reviewed:    EKG:     Atrial  fibrillation, rate 67, axis within normal limits, intervals within normal limits, no acute ST-T wave changes.   Risk Assessment/Calculations:    [name]2DS2-VASc Score = 3   This indicates a 3.2% annual risk of stroke. The patient's score is based upon: CHF History: 0 HTN History: 1 Diabetes History: 0 Stroke History: 2 Vascular Disease History: 0 Age Score: 0 Gender Score: 0    Physical Exam:   VS:  BP 136/84   Pulse 67   Ht 6\' 1"  (1.854 m)   Wt (!) 331 lb 9.6 oz (150.4 kg)   SpO2 95%   BMI 43.75 kg/m    Wt Readings from Last 3 Encounters:  04/14/23 (!) 331 lb 9.6 oz (150.4 kg)  10/14/22 (!) 338 lb 6.4 oz (153.5 kg)  10/09/22 (!) 323 lb (146.5 kg)     GEN: Well nourished, well developed in no acute distress NECK: No JVD; No carotid bruits CARDIAC: Irregular RR, no murmurs, rubs, gallops RESPIRATORY:  Clear to auscultation without rales, wheezing or rhonchi  ABDOMEN: Soft, non-tender, non-distended EXTREMITIES:  No edema; No deformity   ASSESSMENT AND PLAN:   ATRIAL FIB:  [name]. has a [name]2DS2 - VASc score of 3.  He has been on anticoagulation for over a decade.  I do not think ablation would be an option at this  point.  He tolerates anticoagulation and rate control.  No change in therapy.    CVA:   He has some mild residual left arm weakness but otherwise has done well.  No change in therapy.   LVH : His last ejection fraction was 55%.  I would like to screen with another echocardiogram as it has been 9 years.    HTN:     He is on good medical therapy.  He finally has good rate control.  No change in therapy.  OBESITY:   I am going to refer him to [facility]  SLEEP APNEA: He is wearing CPAP.  No change in therapy.        Signed, Rollene Rotunda, MD

## 2023-04-14 ENCOUNTER — Encounter: Payer: Self-pay | Admitting: Cardiology

## 2023-04-14 ENCOUNTER — Ambulatory Visit: Payer: No Typology Code available for payment source | Attending: Cardiology | Admitting: Cardiology

## 2023-04-14 VITALS — BP 136/84 | HR 67 | Ht 73.0 in | Wt 331.6 lb

## 2023-04-14 DIAGNOSIS — I4891 Unspecified atrial fibrillation: Secondary | ICD-10-CM

## 2023-04-14 DIAGNOSIS — I119 Hypertensive heart disease without heart failure: Secondary | ICD-10-CM

## 2023-04-14 MED ORDER — EPLERENONE 25 MG PO TABS: 25.0000 mg | ORAL_TABLET | Freq: Every day | ORAL | 3 refills | Status: DC

## 2023-04-14 NOTE — Patient Instructions (Signed)
Medication Instructions:  Your physician recommends that you continue on your current medications as directed. Please refer to the Current Medication list given to you today.  *If you need a refill on your cardiac medications before your next appointment, please call your pharmacy*   Testing/Procedures: Your physician has requested that you have an echocardiogram. Echocardiography is a painless test that uses sound waves to create images of your heart. It provides your doctor with information about the size and shape of your heart and how well your heart's chambers and valves are working. This procedure takes approximately one hour. There are no restrictions for this procedure. Please do NOT wear cologne, perfume, aftershave, or lotions (deodorant is allowed). Please arrive 15 minutes prior to your appointment time. This will take place at [address] [place]    Follow-Up: At [facility], you and your health needs are our priority.  As part of our continuing mission to provide you with exceptional heart care, we have created designated Provider Care Teams.  These Care Teams include your primary Cardiologist (physician) and Advanced Practice Providers (APPs -  Physician Assistants and Nurse Practitioners) who all work together to provide you with the care you need, when you need it.  We recommend signing up for the patient portal called "MyChart".  Sign up information is provided on this After Visit Summary.  MyChart is used to connect with patients for Virtual Visits (Telemedicine).  Patients are able to view lab/test results, encounter notes, upcoming appointments, etc.  Non-urgent messages can be sent to your provider as well.   To learn more about what you can do with MyChart, go to [email or web address].    Your next appointment:   6 month(s)  Provider:   Rollene Rotunda, MD

## 2023-05-13 ENCOUNTER — Ambulatory Visit (HOSPITAL_COMMUNITY): Payer: No Typology Code available for payment source | Attending: Cardiology

## 2023-05-13 DIAGNOSIS — I119 Hypertensive heart disease without heart failure: Secondary | ICD-10-CM | POA: Insufficient documentation

## 2023-05-13 DIAGNOSIS — I4891 Unspecified atrial fibrillation: Secondary | ICD-10-CM | POA: Diagnosis present

## 2023-05-13 LAB — ECHOCARDIOGRAM COMPLETE: S' Lateral: 2.7 cm

## 2023-05-28 ENCOUNTER — Other Ambulatory Visit: Payer: Self-pay | Admitting: Family Medicine

## 2023-06-23 ENCOUNTER — Encounter: Payer: Self-pay | Admitting: Nurse Practitioner

## 2023-06-25 ENCOUNTER — Encounter: Payer: Self-pay | Admitting: Cardiology

## 2023-07-14 DIAGNOSIS — Z0289 Encounter for other administrative examinations: Secondary | ICD-10-CM

## 2023-08-04 ENCOUNTER — Other Ambulatory Visit: Payer: Self-pay | Admitting: Family Medicine

## 2023-08-06 ENCOUNTER — Ambulatory Visit: Payer: No Typology Code available for payment source | Admitting: Nurse Practitioner

## 2023-08-06 ENCOUNTER — Encounter: Payer: Self-pay | Admitting: Nurse Practitioner

## 2023-08-06 VITALS — BP 140/87 | HR 75 | Temp 98.6°F | Ht 72.0 in | Wt 332.0 lb

## 2023-08-06 DIAGNOSIS — E7849 Other hyperlipidemia: Secondary | ICD-10-CM | POA: Diagnosis not present

## 2023-08-06 DIAGNOSIS — I10 Essential (primary) hypertension: Secondary | ICD-10-CM

## 2023-08-06 DIAGNOSIS — R7303 Prediabetes: Secondary | ICD-10-CM

## 2023-08-06 DIAGNOSIS — E559 Vitamin D deficiency, unspecified: Secondary | ICD-10-CM | POA: Diagnosis not present

## 2023-08-06 DIAGNOSIS — N183 Chronic kidney disease, stage 3 unspecified: Secondary | ICD-10-CM

## 2023-08-06 DIAGNOSIS — R0602 Shortness of breath: Secondary | ICD-10-CM

## 2023-08-06 DIAGNOSIS — Z79899 Other long term (current) drug therapy: Secondary | ICD-10-CM

## 2023-08-06 DIAGNOSIS — G4733 Obstructive sleep apnea (adult) (pediatric): Secondary | ICD-10-CM

## 2023-08-06 DIAGNOSIS — Z6841 Body Mass Index (BMI) 40.0 and over, adult: Secondary | ICD-10-CM

## 2023-08-06 MED ORDER — WEGOVY 0.25 MG/0.5ML ~~LOC~~ SOAJ
0.2500 mg | SUBCUTANEOUS | 0 refills | Status: DC
Start: 2023-08-06 — End: 2023-09-18

## 2023-08-06 NOTE — Patient Instructions (Signed)

## 2023-08-06 NOTE — Progress Notes (Signed)
Office: [phone]  /  Fax: [phone]  WEIGHT SUMMARY AND BIOMETRICS  Weight Lost Since Last Visit: 0lb  Weight Gained Since Last Visit: 9lb   Vitals Temp: 98.6 F (37 C) BP: (!) 140/87 Pulse Rate: 75 SpO2: 98 %   Anthropometric Measurements Height: 6' (1.829 m) Weight: (!) 332 lb (150.6 kg) BMI (Calculated): 45.02 Weight at Last Visit: 323lb Weight Lost Since Last Visit: 0lb Weight Gained Since Last Visit: 9lb Starting Weight: 326lb Total Weight Loss (lbs): 0 lb (0 kg)   Body Composition  Body Fat %: 38.2 % Fat Mass (lbs): 127.2 lbs Muscle Mass (lbs): 195.6 lbs Total Body Water (lbs): 148.6 lbs Visceral Fat Rating : 24   Other Clinical Data RMR: [id number] Fasting: yes Labs: no Today's Visit #: 7 Starting Date: 06/27/22     HPI  Chief Complaint: OBESITY  Thomas [name] is here to discuss his progress with his obesity treatment plan. He is on the the Category 4 Plan and states he is following his eating plan approximately 0 % of the time. He states he is exercising 0 minutes 0 days per week.   Interval History:  Patient was seen here last on 06/23/2023 and has gained 9 pounds.  He has past medical history of atrial fibrillation, hypertension, CHF, obstructive sleep apnea syndrome on CPAP history of CVA, CKD, hyperlipidemia, gout and depression.  [name] has been struggling with his weight for many years and has been unsuccessful in either losing weight, maintaining weight loss, or reaching his healthy weight goal.   He has been heavy most of his life and started gaining excess weight in 2002.  His heaviest weight was 345 pounds.  He is eating 2 meals and 2 snacks daily. He is struggling with portion sizes especially dinner. Notes some polyphagia and cravings.  Snacks on peanut butter crackers and belvita.   He is drinking water, mushroom coffee, body armour and occ diet soda.    Pharmacotherapy for weight loss: He is not currently taking medications  for medical  weight loss.    Previous pharmacotherapy for medical weight loss:  none  Bariatric surgery:  Patient has not had bariatric surgery.      Obstructive Sleep Apnea Cullin has a diagnosis of sleep apnea. He reports that he is using a CPAP regularly.    Vit D deficiency  He is not currently taking Vit D. Has taken Vit D 50,000 international units  in the past.   Lab Results  Component Value Date   VD25OH 33.6 06/27/2022    CKD Has appt with nephrology in November.  Hyperlipidemia Medication(s): Crestor 10mg . Denies side effects.   Cardiovascular risk factors: dyslipidemia, hypertension, male gender, and sedentary lifestyle  Lab Results  Component Value Date   CHOL 122 06/27/2022   HDL 48 06/27/2022   LDLCALC 61 06/27/2022   TRIG 59 06/27/2022   CHOLHDL 2.5 06/27/2022   Lab Results  Component Value Date   ALT 39 06/27/2022   AST 46 (H) 06/27/2022   ALKPHOS 46 06/27/2022   BILITOT 0.5 06/27/2022   The ASCVD Risk score (Arnett DK, et al., 2019) failed to calculate for the following reasons:   The patient has a prior MI or stroke diagnosis   Hypertension Hypertension BP is elevated today. He saw cardiology last on 06/25/23 and has a follow up visit scheduled on 10/15/23 Denies chest pain, palpitations and SOB.  BP Readings from Last 3 Encounters:  08/06/23 (!) 140/87  04/14/23 136/84  10/14/22 Marland Kitchen)  158/106   Lab Results  Component Value Date   CREATININE 1.75 (H) 06/27/2022   CREATININE 1.61 (H) 03/29/2022   CREATININE 1.66 (H) 10/19/2021    Prediabetes Last A1c was 5.6  Medication(s): never been on meds Polyphagia:Yes Mother:  DMT2  Lab Results  Component Value Date   HGBA1C 5.6 03/29/2022   HGBA1C 5.4 04/09/2021   HGBA1C 5.7 05/10/2019   HGBA1C 5.8 05/21/2016   HGBA1C 5.7 02/22/2015   No results found for: "INSULIN"  Shortness of breath with exertion Last IC was 1728 on 06/27/22 Today IC was 2534  PHYSICAL EXAM:  Blood pressure (!) 140/87, pulse 75,  temperature 98.6 F (37 C), height 6' (1.829 m), weight (!) 332 lb (150.6 kg), SpO2 98%. Body mass index is 45.03 kg/m.  General: He is overweight, cooperative, alert, well developed, and in no acute distress. PSYCH: Has normal mood, affect and thought process.   Extremities: No edema.  Neurologic: No gross sensory or motor deficits. No tremors or fasciculations noted.    DIAGNOSTIC DATA REVIEWED:  BMET    Component Value Date/Time   NA 141 06/27/2022 1115   K 4.4 06/27/2022 1115   CL 104 06/27/2022 1115   CO2 23 06/27/2022 1115   GLUCOSE 86 06/27/2022 1115   GLUCOSE 92 03/29/2022 1525   BUN 29 (H) 06/27/2022 1115   CREATININE 1.75 (H) 06/27/2022 1115   CREATININE 1.61 (H) 03/29/2022 1525   CALCIUM 9.7 06/27/2022 1115   GFRNONAA 61 07/28/2020 0944   GFRAA 70 07/28/2020 0944   Lab Results  Component Value Date   HGBA1C 5.6 03/29/2022   HGBA1C  12/04/2008    5.5 (NOTE)   The ADA recommends the following therapeutic goal for glycemic   control related to Hgb A1C measurement:   Goal of Therapy:   < 7.0% Hgb A1C   Reference: American Diabetes Association: Clinical Practice   Recommendations 2008, Diabetes Care,  2008, 31:(Suppl 1).   No results found for: "INSULIN" Lab Results  Component Value Date   TSH 1.15 03/29/2022   CBC    Component Value Date/Time   WBC 3.5 (L) 03/29/2022 1525   RBC 4.85 03/29/2022 1525   HGB 14.0 03/29/2022 1525   HGB 14.1 03/26/2021 0953   HCT 43.0 03/29/2022 1525   HCT 43.8 03/26/2021 0953   PLT 140 03/29/2022 1525   PLT 147 (L) 03/26/2021 0953   MCV 88.7 03/29/2022 1525   MCV 90 03/26/2021 0953   MCH 28.9 03/29/2022 1525   MCHC 32.6 03/29/2022 1525   RDW 13.4 03/29/2022 1525   RDW 14.0 03/26/2021 0953   Iron Studies No results found for: "IRON", "TIBC", "FERRITIN", "IRONPCTSAT" Lipid Panel     Component Value Date/Time   CHOL 122 06/27/2022 1115   TRIG 59 06/27/2022 1115   HDL 48 06/27/2022 1115   CHOLHDL 2.5 06/27/2022 1115    CHOLHDL 2 07/27/2021 0907   VLDL 11.0 07/27/2021 0907   LDLCALC 61 06/27/2022 1115   LDLCALC 82 04/09/2021 1417   Hepatic Function Panel     Component Value Date/Time   PROT 7.3 06/27/2022 1115   ALBUMIN 4.6 06/27/2022 1115   AST 46 (H) 06/27/2022 1115   ALT 39 06/27/2022 1115   ALKPHOS 46 06/27/2022 1115   BILITOT 0.5 06/27/2022 1115   BILIDIR 0.2 07/27/2021 0907      Component Value Date/Time   TSH 1.15 03/29/2022 1525   Nutritional Lab Results  Component Value Date   VD25OH 33.6 06/27/2022  ASSESSMENT AND PLAN  TREATMENT PLAN FOR OBESITY:  Recommended Dietary Goals  Kaptain is currently in the action stage of change. As such, his goal is to continue weight management plan. He has agreed to the Category 4 Plan.  Behavioral Intervention  We discussed the following Behavioral Modification Strategies today: increasing lean protein intake, decreasing simple carbohydrates , increasing vegetables, increasing lower glycemic fruits, increasing water intake, work on meal planning and preparation, reading food labels , keeping healthy foods at home, continue to practice mindfulness when eating, and planning for success.  Additional resources provided today: NA  Recommended Physical Activity Goals  Talan has been advised to work up to 150 minutes of moderate intensity aerobic activity a week and strengthening exercises 2-3 times per week for cardiovascular health, weight loss maintenance and preservation of muscle mass.   He has agreed to Think about ways to increase daily physical activity and overcoming barriers to exercise and Increase physical activity in their day and reduce sedentary time (increase NEAT).   Pharmacotherapy We discussed various medication options to help Melvine with his weight loss efforts and we both agreed to start [facility] 0.25mg .  Side effects.  Would benefit from using Wegovy due to his cardiac history, hyperlipidemia, prediabetes, CHF, CKD, sleep  apnea and history of stroke.  See select study  Avoid phentermine, Qsymia and Contrave due to hypertension, atrial fibrillation, CKD, CHF and history of CVA.  Avoid orlistat due to a history of vitamin D deficiency  ASSOCIATED CONDITIONS ADDRESSED TODAY  Action/Plan  OSA on CPAP Continue CPAP nightly  Vitamin D deficiency -     VITAMIN D 25 Hydroxy (Vit-D Deficiency, Fractures)  Low Vitamin D level contributes to fatigue and are associated with obesity, breast, and colon cancer. He agrees to continue to take prescription Vitamin D @50 ,000 IU every week and will follow-up for routine testing of Vitamin D, at least 2-3 times per year to avoid over-replacement.   Stage 3 chronic kidney disease, unspecified whether stage 3a or 3b CKD (HCC) Keep appointment scheduled with nephrology  Other hyperlipidemia -     Lipid Panel With LDL/HDL Ratio  Continue to follow-up with cardiology and PCP.  Continue medications as directed  Essential hypertension Continue to follow-up with cardiology and PCP.  Continue medications as directed  Pre-diabetes -     Hemoglobin A1c -     Insulin, random  Shaman will continue to work on weight loss, exercise, and decreasing simple carbohydrates to help decrease the risk of diabetes.    SOB (shortness of breath) on exertion IC reviewed with patient today..  Improved  Medication management -     Comprehensive metabolic panel -     CBC with Differential/Platelet  Morbid obesity (HCC) -     TSH -     XBMWUX; Inject 0.25 mg into the skin once a week.  Dispense: 2 mL; Refill: 0  BMI 45.0-49.9, adult (HCC) -     TSH -     LKGMWN; Inject 0.25 mg into the skin once a week.  Dispense: 2 mL; Refill: 0         Return in about 3 weeks (around 08/27/2023).Marland Kitchen He was informed of the importance of frequent follow up visits to maximize his success with intensive lifestyle modifications for his multiple health conditions.   ATTESTASTION STATEMENTS:  Reviewed  by clinician on day of visit: allergies, medications, problem list, medical history, surgical history, family history, social history, and previous encounter notes.  Theodis Sato. Sheniece Ruggles FNP-C

## 2023-08-07 ENCOUNTER — Encounter: Payer: Self-pay | Admitting: Nurse Practitioner

## 2023-08-07 LAB — LIPID PANEL WITH LDL/HDL RATIO
Cholesterol, Total: 120 mg/dL (ref 100–199)
HDL: 51 mg/dL (ref >39)
LDL Chol Calc (NIH): 54 mg/dL (ref 0–99)
LDL/HDL Ratio: 1.1 ratio (ref 0.0–3.6)
Triglycerides: 71 mg/dL (ref 0–149)
VLDL Cholesterol Cal: 15 mg/dL (ref 5–40)

## 2023-08-07 LAB — COMPREHENSIVE METABOLIC PANEL
ALT: 31 IU/L (ref 0–44)
AST: 34 IU/L (ref 0–40)
Albumin: 4.5 g/dL (ref 4.1–5.1)
Alkaline Phosphatase: 53 IU/L (ref 44–121)
BUN/Creatinine Ratio: 14 (ref 9–20)
BUN: 22 mg/dL (ref 6–24)
Bilirubin Total: 0.5 mg/dL (ref 0.0–1.2)
CO2: 21 mmol/L (ref 20–29)
Calcium: 9.7 mg/dL (ref 8.7–10.2)
Chloride: 105 mmol/L (ref 96–106)
Creatinine, Ser: 1.52 mg/dL — ABNORMAL HIGH (ref 0.76–1.27)
Globulin, Total: 2.7 g/dL (ref 1.5–4.5)
Glucose: 93 mg/dL (ref 70–99)
Potassium: 4.2 mmol/L (ref 3.5–5.2)
Sodium: 141 mmol/L (ref 134–144)
Total Protein: 7.2 g/dL (ref 6.0–8.5)
eGFR: 59 mL/min/{1.73_m2} — ABNORMAL LOW (ref >59)

## 2023-08-07 LAB — CBC WITH DIFFERENTIAL/PLATELET
Basophils Absolute: 0 10*3/uL (ref 0.0–0.2)
Basos: 1 %
EOS (ABSOLUTE): 0.1 10*3/uL (ref 0.0–0.4)
Eos: 2 %
Hematocrit: 43.6 % (ref 37.5–51.0)
Hemoglobin: 14.6 g/dL (ref 13.0–17.7)
Immature Grans (Abs): 0 10*3/uL (ref 0.0–0.1)
Immature Granulocytes: 0 %
Lymphocytes Absolute: 1.5 10*3/uL (ref 0.7–3.1)
Lymphs: 45 %
MCH: 29.6 pg (ref 26.6–33.0)
MCHC: 33.5 g/dL (ref 31.5–35.7)
MCV: 88 fL (ref 79–97)
Monocytes Absolute: 0.3 10*3/uL (ref 0.1–0.9)
Monocytes: 11 %
Neutrophils Absolute: 1.3 10*3/uL — ABNORMAL LOW (ref 1.4–7.0)
Neutrophils: 41 %
Platelets: 144 10*3/uL — ABNORMAL LOW (ref 150–450)
RBC: 4.94 x10E6/uL (ref 4.14–5.80)
RDW: 13.6 % (ref 11.6–15.4)
WBC: 3.2 10*3/uL — ABNORMAL LOW (ref 3.4–10.8)

## 2023-08-07 LAB — VITAMIN D 25 HYDROXY (VIT D DEFICIENCY, FRACTURES): Vit D, 25-Hydroxy: 27.3 ng/mL — ABNORMAL LOW (ref 30.0–100.0)

## 2023-08-07 LAB — HEMOGLOBIN A1C
Est. average glucose Bld gHb Est-mCnc: 123 mg/dL
Hgb A1c MFr Bld: 5.9 % — ABNORMAL HIGH (ref 4.8–5.6)

## 2023-08-07 LAB — TSH: TSH: 1.24 u[IU]/mL (ref 0.450–4.500)

## 2023-08-07 LAB — INSULIN, RANDOM: INSULIN: 27.5 u[IU]/mL — ABNORMAL HIGH (ref 2.6–24.9)

## 2023-08-10 ENCOUNTER — Other Ambulatory Visit: Payer: Self-pay | Admitting: Nurse Practitioner

## 2023-08-10 DIAGNOSIS — E559 Vitamin D deficiency, unspecified: Secondary | ICD-10-CM

## 2023-08-18 ENCOUNTER — Other Ambulatory Visit: Payer: Self-pay | Admitting: Nurse Practitioner

## 2023-08-18 DIAGNOSIS — E559 Vitamin D deficiency, unspecified: Secondary | ICD-10-CM

## 2023-08-18 MED ORDER — VITAMIN D (ERGOCALCIFEROL) 1.25 MG (50000 UNIT) PO CAPS: 50000.0000 [IU] | ORAL_CAPSULE | ORAL | 0 refills | Status: DC

## 2023-08-19 ENCOUNTER — Telehealth: Payer: Self-pay

## 2023-08-19 NOTE — Telephone Encounter (Signed)
PA submitted through Cover My Meds for [facility]. Awaiting insurance determination. Key: [id number]

## 2023-08-20 NOTE — Telephone Encounter (Signed)
Received fax fom [facility] that Thomas [name] has been approved from 08/19/23-03/18/24.

## 2023-08-27 ENCOUNTER — Ambulatory Visit (INDEPENDENT_AMBULATORY_CARE_PROVIDER_SITE_OTHER): Payer: No Typology Code available for payment source | Admitting: Nurse Practitioner

## 2023-08-27 ENCOUNTER — Encounter: Payer: Self-pay | Admitting: Nurse Practitioner

## 2023-08-27 VITALS — BP 150/88 | HR 59 | Temp 98.0°F | Ht 72.0 in | Wt 334.0 lb

## 2023-08-27 DIAGNOSIS — R7303 Prediabetes: Secondary | ICD-10-CM

## 2023-08-27 DIAGNOSIS — R110 Nausea: Secondary | ICD-10-CM

## 2023-08-27 DIAGNOSIS — E559 Vitamin D deficiency, unspecified: Secondary | ICD-10-CM | POA: Diagnosis not present

## 2023-08-27 DIAGNOSIS — Z6841 Body Mass Index (BMI) 40.0 and over, adult: Secondary | ICD-10-CM

## 2023-08-27 DIAGNOSIS — I10 Essential (primary) hypertension: Secondary | ICD-10-CM | POA: Diagnosis not present

## 2023-08-27 DIAGNOSIS — D72819 Decreased white blood cell count, unspecified: Secondary | ICD-10-CM

## 2023-08-27 MED ORDER — ONDANSETRON HCL 4 MG PO TABS: 4.0000 mg | ORAL_TABLET | Freq: Three times a day (TID) | ORAL | 0 refills | Status: DC | PRN

## 2023-08-27 NOTE — Progress Notes (Signed)
Office: [phone]  /  Fax: [phone]  WEIGHT SUMMARY AND BIOMETRICS  Weight Lost Since Last Visit: 0lb  Weight Gained Since Last Visit: 2lb   Vitals Temp: 98 F (36.7 C) BP: (!) 150/88 Pulse Rate: (!) 59 SpO2: 96 %   Anthropometric Measurements Height: 6' (1.829 m) Weight: (!) 334 lb (151.5 kg) BMI (Calculated): 45.29 Weight at Last Visit: 332lb Weight Lost Since Last Visit: 0lb Weight Gained Since Last Visit: 2lb Starting Weight: 326lb Total Weight Loss (lbs): 0 lb (0 kg)   Body Composition  Body Fat %: 38.5 % Fat Mass (lbs): 128.8 lbs Muscle Mass (lbs): 196 lbs Total Body Water (lbs): 152.8 lbs Visceral Fat Rating : 24   Other Clinical Data Fasting: Yes Labs: No Today's Visit #: 8 Starting Date: 06/27/22     HPI  Chief Complaint: OBESITY  Thomas [name] is here to discuss his progress with his obesity treatment plan. He is on the the Category 4 Plan and states he is following his eating plan approximately 60 % of the time. He states he is exercising 0 minutes 0 days per week.   Interval History:  Since last office visit he has gained 2 pounds.     Pharmacotherapy for weight loss: He is currently taking Wegovy [name]  x1 dose for medical weight loss.  Notes side effects of nausea.    Wegovy approved through 03/18/24  Previous pharmacotherapy for medical weight loss:  none  Bariatric surgery:  Patient has not had bariatric surgery  Hypertension Hypertension BP is elevated again today.  Medication(s): notes he has not taken his meds today.   Denies chest pain, palpitations and SOB.  BP Readings from Last 3 Encounters:  08/27/23 (!) 150/88  08/06/23 (!) 140/87  04/14/23 136/84   Lab Results  Component Value Date   CREATININE 1.52 (H) 08/06/2023   CREATININE 1.75 (H) 06/27/2022   CREATININE 1.61 (H) 03/29/2022    Vit D deficiency  He is not currently taking Vit D 50,000 IU weekly but plans to start.    Lab Results  Component Value Date    VD25OH 27.3 (L) 08/06/2023   VD25OH 33.6 06/27/2022    Leukopenia WBCs range 3.2-4.3 over the past 4 years.  Has not seen hematology in the past.   Prediabetes Last A1c was 5.9  Medication(s): never been on meds Polyphagia:Yes FH: mother with DMT2  Lab Results  Component Value Date   HGBA1C 5.9 (H) 08/06/2023   HGBA1C 5.6 03/29/2022   HGBA1C 5.4 04/09/2021   HGBA1C 5.7 05/10/2019   HGBA1C 5.8 05/21/2016   Lab Results  Component Value Date   INSULIN 27.5 (H) 08/06/2023    PHYSICAL EXAM:  Blood pressure (!) 150/88, pulse (!) 59, temperature 98 F (36.7 C), height 6' (1.829 m), weight (!) 334 lb (151.5 kg), SpO2 96%. Body mass index is 45.3 kg/m.  General: He is overweight, cooperative, alert, well developed, and in no acute distress. PSYCH: Has normal mood, affect and thought process.   Extremities: No edema.  Neurologic: No gross sensory or motor deficits. No tremors or fasciculations noted.    DIAGNOSTIC DATA REVIEWED:  BMET    Component Value Date/Time   NA 141 08/06/2023 1153   K 4.2 08/06/2023 1153   CL 105 08/06/2023 1153   CO2 21 08/06/2023 1153   GLUCOSE 93 08/06/2023 1153   GLUCOSE 92 03/29/2022 1525   BUN 22 08/06/2023 1153   CREATININE 1.52 (H) 08/06/2023 1153   CREATININE 1.61 (H) 03/29/2022  1525   CALCIUM 9.7 08/06/2023 1153   GFRNONAA 61 07/28/2020 0944   GFRAA 70 07/28/2020 0944   Lab Results  Component Value Date   HGBA1C 5.9 (H) 08/06/2023   HGBA1C  12/04/2008    5.5 (NOTE)   The ADA recommends the following therapeutic goal for glycemic   control related to Hgb A1C measurement:   Goal of Therapy:   < 7.0% Hgb A1C   Reference: American Diabetes Association: Clinical Practice   Recommendations 2008, Diabetes Care,  2008, 31:(Suppl 1).   Lab Results  Component Value Date   INSULIN 27.5 (H) 08/06/2023   Lab Results  Component Value Date   TSH 1.240 08/06/2023   CBC    Component Value Date/Time   WBC 3.2 (L) 08/06/2023 1153    WBC 3.5 (L) 03/29/2022 1525   RBC 4.94 08/06/2023 1153   RBC 4.85 03/29/2022 1525   HGB 14.6 08/06/2023 1153   HCT 43.6 08/06/2023 1153   PLT 144 (L) 08/06/2023 1153   MCV 88 08/06/2023 1153   MCH 29.6 08/06/2023 1153   MCH 28.9 03/29/2022 1525   MCHC 33.5 08/06/2023 1153   MCHC 32.6 03/29/2022 1525   RDW 13.6 08/06/2023 1153   Iron Studies No results found for: "IRON", "TIBC", "FERRITIN", "IRONPCTSAT" Lipid Panel     Component Value Date/Time   CHOL 120 08/06/2023 1153   TRIG 71 08/06/2023 1153   HDL 51 08/06/2023 1153   CHOLHDL 2.5 06/27/2022 1115   CHOLHDL 2 07/27/2021 0907   VLDL 11.0 07/27/2021 0907   LDLCALC 54 08/06/2023 1153   LDLCALC 82 04/09/2021 1417   Hepatic Function Panel     Component Value Date/Time   PROT 7.2 08/06/2023 1153   ALBUMIN 4.5 08/06/2023 1153   AST 34 08/06/2023 1153   ALT 31 08/06/2023 1153   ALKPHOS 53 08/06/2023 1153   BILITOT 0.5 08/06/2023 1153   BILIDIR 0.2 07/27/2021 0907      Component Value Date/Time   TSH 1.240 08/06/2023 1153   Nutritional Lab Results  Component Value Date   VD25OH 27.3 (L) 08/06/2023   VD25OH 33.6 06/27/2022     ASSESSMENT AND PLAN  TREATMENT PLAN FOR OBESITY:  Recommended Dietary Goals  Thomas [name] is currently in the action stage of change. As such, his goal is to continue weight management plan. He has agreed to the Category 4 Plan.  Behavioral Intervention  We discussed the following Behavioral Modification Strategies today: increasing lean protein intake, decreasing simple carbohydrates , increasing vegetables, increasing lower glycemic fruits, increasing water intake, work on meal planning and preparation, reading food labels , keeping healthy foods at home, continue to practice mindfulness when eating, and planning for success.  Additional resources provided today: NA  Recommended Physical Activity Goals  Thomas [name] has been advised to work up to 150 minutes of moderate intensity aerobic activity  a week and strengthening exercises 2-3 times per week for cardiovascular health, weight loss maintenance and preservation of muscle mass.   He has agreed to Think about ways to increase daily physical activity and overcoming barriers to exercise, Increase physical activity in their day and reduce sedentary time (increase NEAT)., and Work on scheduling and tracking physical activity.    Pharmacotherapy We discussed various medication options to help Thomas [name] with his weight loss efforts and we both agreed to continue Thomas [name] [name] .  side effects discussed.  ASSOCIATED CONDITIONS ADDRESSED TODAY  Action/Plan  Essential hypertension Continue to follow up with PCP and cardiology.  Continue  meds as directed  Vitamin D deficiency Plans to start Vit D  Nausea -     Ondansetron HCl; Take 1 tablet (4 mg total) by mouth every 8 (eight) hours as needed for nausea or vomiting.  Dispense: 20 tablet; Refill: 0.  Side effects discussed.    Pre-diabetes Thomas [name] will continue to work on weight loss, exercise, and decreasing simple carbohydrates to help decrease the risk of diabetes.    Leukopenia, unspecified type -     CBC with Differential/Platelet  Will consider referral to hematology   Morbid obesity (HCC)  BMI 45.0-49.9, adult (HCC)      Labs reviewed in chart with patient 08/06/23     Return in about 3 weeks (around 09/17/2023).Marland Kitchen He was informed of the importance of frequent follow up visits to maximize his success with intensive lifestyle modifications for his multiple health conditions.   ATTESTASTION STATEMENTS:  Reviewed by clinician on day of visit: allergies, medications, problem list, medical history, surgical history, family history, social history, and previous encounter notes.     Theodis Sato. Ramon Brant FNP-C

## 2023-08-27 NOTE — Addendum Note (Signed)
Addended by: Irene Limbo on: 08/27/2023 02:52 PM   Modules accepted: Level of Service

## 2023-08-28 LAB — CBC WITH DIFFERENTIAL/PLATELET
Basophils Absolute: 0 10*3/uL (ref 0.0–0.2)
Basos: 0 %
EOS (ABSOLUTE): 0.1 10*3/uL (ref 0.0–0.4)
Eos: 1 %
Hematocrit: 42.2 % (ref 37.5–51.0)
Hemoglobin: 13.7 g/dL (ref 13.0–17.7)
Immature Grans (Abs): 0 10*3/uL (ref 0.0–0.1)
Immature Granulocytes: 0 %
Lymphocytes Absolute: 1.9 10*3/uL (ref 0.7–3.1)
Lymphs: 51 %
MCH: 29.4 pg (ref 26.6–33.0)
MCHC: 32.5 g/dL (ref 31.5–35.7)
MCV: 91 fL (ref 79–97)
Monocytes Absolute: 0.4 10*3/uL (ref 0.1–0.9)
Monocytes: 10 %
Neutrophils Absolute: 1.4 10*3/uL (ref 1.4–7.0)
Neutrophils: 38 %
Platelets: 155 10*3/uL (ref 150–450)
RBC: 4.66 x10E6/uL (ref 4.14–5.80)
RDW: 14.2 % (ref 11.6–15.4)
WBC: 3.7 10*3/uL (ref 3.4–10.8)

## 2023-09-15 ENCOUNTER — Other Ambulatory Visit: Payer: Self-pay | Admitting: Nurse Practitioner

## 2023-09-15 DIAGNOSIS — Z6841 Body Mass Index (BMI) 40.0 and over, adult: Secondary | ICD-10-CM

## 2023-09-16 ENCOUNTER — Other Ambulatory Visit: Payer: Self-pay | Admitting: Nurse Practitioner

## 2023-09-16 DIAGNOSIS — E559 Vitamin D deficiency, unspecified: Secondary | ICD-10-CM

## 2023-09-18 ENCOUNTER — Encounter: Payer: Self-pay | Admitting: Nurse Practitioner

## 2023-09-18 ENCOUNTER — Ambulatory Visit: Payer: No Typology Code available for payment source | Admitting: Nurse Practitioner

## 2023-09-18 VITALS — BP 158/90 | HR 64 | Temp 98.3°F | Ht 72.0 in | Wt 328.0 lb

## 2023-09-18 DIAGNOSIS — D72819 Decreased white blood cell count, unspecified: Secondary | ICD-10-CM | POA: Diagnosis not present

## 2023-09-18 DIAGNOSIS — I10 Essential (primary) hypertension: Secondary | ICD-10-CM

## 2023-09-18 DIAGNOSIS — Z6841 Body Mass Index (BMI) 40.0 and over, adult: Secondary | ICD-10-CM

## 2023-09-18 MED ORDER — WEGOVY 0.5 MG/0.5ML ~~LOC~~ SOAJ
0.5000 mg | SUBCUTANEOUS | 0 refills | Status: DC
Start: 2023-09-18 — End: 2023-10-15

## 2023-09-18 NOTE — Progress Notes (Signed)
Office: [phone]  /  Fax: [phone]  WEIGHT SUMMARY AND BIOMETRICS  Weight Lost Since Last Visit: 6lb  Weight Gained Since Last Visit: 0lb   Vitals Temp: 98.3 F (36.8 C) BP: (!) 168/94 Pulse Rate: 64 SpO2: 99 %   Anthropometric Measurements Height: 6' (1.829 m) Weight: (!) 328 lb (148.8 kg) BMI (Calculated): 44.48 Weight at Last Visit: 334lb Weight Lost Since Last Visit: 6lb Weight Gained Since Last Visit: 0lb Starting Weight: 326lb Total Weight Loss (lbs): 0 lb (0 kg)   Body Composition  Body Fat %: 38 % Fat Mass (lbs): 124.8 lbs Muscle Mass (lbs): 193.8 lbs Total Body Water (lbs): 147 lbs Visceral Fat Rating : 23   Other Clinical Data Fasting: No Labs: No Today's Visit #: 9 Starting Date: 06/27/22     HPI  Chief Complaint: OBESITY  Rajvir is here to discuss his progress with his obesity treatment plan. He is on the the Category 4 Plan and states he is following his eating plan approximately 50 % of the time. He states he is exercising 0 minutes 0 days per week.   Interval History:  Since last office visit he has lost 6 pounds.  He is not skipping meals, meeting calories and protein goals.  He is drinking a protein shake a couple days per week.  He is drinking water and coffee daily.  He is walking more.      Pharmacotherapy for weight loss: He is currently taking Wegovy 0.25mg  for medical weight loss.  Denies side effects.    Wegovy approved through 03/18/24   Previous pharmacotherapy for medical weight loss:  None  Bariatric surgery:  Patient has not had bariatric surgery  Hypertension Hypertension poorly controlled. BP continues to be elevated.  Saw cardiology last on 04/14/23 and has follow up appt scheduled on 10/15/23 Medication(s): took meds today prior to visit.  Denies chest pain, palpitations and SOB.  BP Readings from Last 3 Encounters:  09/18/23 (!) 168/94  08/27/23 (!) 150/88  08/06/23 (!) 140/87   Lab Results   Component Value Date   CREATININE 1.52 (H) 08/06/2023   CREATININE 1.75 (H) 06/27/2022   CREATININE 1.61 (H) 03/29/2022     Leukopenia Last labs wnl-looked better  PHYSICAL EXAM:  Blood pressure (!) 168/94, pulse 64, temperature 98.3 F (36.8 C), height 6' (1.829 m), weight (!) 328 lb (148.8 kg), SpO2 99%. Body mass index is 44.48 kg/m.  General: He is overweight, cooperative, alert, well developed, and in no acute distress. PSYCH: Has normal mood, affect and thought process.   Extremities: No edema.  Neurologic: No gross sensory or motor deficits. No tremors or fasciculations noted.    DIAGNOSTIC DATA REVIEWED:  BMET    Component Value Date/Time   NA 141 08/06/2023 1153   K 4.2 08/06/2023 1153   CL 105 08/06/2023 1153   CO2 21 08/06/2023 1153   GLUCOSE 93 08/06/2023 1153   GLUCOSE 92 03/29/2022 1525   BUN 22 08/06/2023 1153   CREATININE 1.52 (H) 08/06/2023 1153   CREATININE 1.61 (H) 03/29/2022 1525   CALCIUM 9.7 08/06/2023 1153   GFRNONAA 61 07/28/2020 0944   GFRAA 70 07/28/2020 0944   Lab Results  Component Value Date   HGBA1C 5.9 (H) 08/06/2023   HGBA1C  12/04/2008    5.5 (NOTE)   The ADA recommends the following therapeutic goal for glycemic   control related to Hgb A1C measurement:   Goal of Therapy:   < 7.0% Hgb A1C  Reference: American Diabetes Association: Clinical Practice   Recommendations 2008, Diabetes Care,  2008, 31:(Suppl 1).   Lab Results  Component Value Date   INSULIN 27.5 (H) 08/06/2023   Lab Results  Component Value Date   TSH 1.240 08/06/2023   CBC    Component Value Date/Time   WBC 3.7 08/27/2023 1232   WBC 3.5 (L) 03/29/2022 1525   RBC 4.66 08/27/2023 1232   RBC 4.85 03/29/2022 1525   HGB 13.7 08/27/2023 1232   HCT 42.2 08/27/2023 1232   PLT 155 08/27/2023 1232   MCV 91 08/27/2023 1232   MCH 29.4 08/27/2023 1232   MCH 28.9 03/29/2022 1525   MCHC 32.5 08/27/2023 1232   MCHC 32.6 03/29/2022 1525   RDW 14.2 08/27/2023 1232    Iron Studies No results found for: "IRON", "TIBC", "FERRITIN", "IRONPCTSAT" Lipid Panel     Component Value Date/Time   CHOL 120 08/06/2023 1153   TRIG 71 08/06/2023 1153   HDL 51 08/06/2023 1153   CHOLHDL 2.5 06/27/2022 1115   CHOLHDL 2 07/27/2021 0907   VLDL 11.0 07/27/2021 0907   LDLCALC 54 08/06/2023 1153   LDLCALC 82 04/09/2021 1417   Hepatic Function Panel     Component Value Date/Time   PROT 7.2 08/06/2023 1153   ALBUMIN 4.5 08/06/2023 1153   AST 34 08/06/2023 1153   ALT 31 08/06/2023 1153   ALKPHOS 53 08/06/2023 1153   BILITOT 0.5 08/06/2023 1153   BILIDIR 0.2 07/27/2021 0907      Component Value Date/Time   TSH 1.240 08/06/2023 1153   Nutritional Lab Results  Component Value Date   VD25OH 27.3 (L) 08/06/2023   VD25OH 33.6 06/27/2022     ASSESSMENT AND PLAN  TREATMENT PLAN FOR OBESITY:  Recommended Dietary Goals  Christopherjohn is currently in the action stage of change. As such, his goal is to continue weight management plan. He has agreed to the Category 4 Plan.  Behavioral Intervention  We discussed the following Behavioral Modification Strategies today: increasing lean protein intake to established goals, increasing water intake , work on meal planning and preparation, reading food labels , keeping healthy foods at home, and continue to work on maintaining a reduced calorie state, getting the recommended amount of protein, incorporating whole foods, making healthy choices, staying well hydrated and practicing mindfulness when eating..  Additional resources provided today: NA  Recommended Physical Activity Goals  Deke has been advised to work up to 150 minutes of moderate intensity aerobic activity a week and strengthening exercises 2-3 times per week for cardiovascular health, weight loss maintenance and preservation of muscle mass.   He has agreed to Think about enjoyable ways to increase daily physical activity and overcoming barriers to exercise,  Increase physical activity in their day and reduce sedentary time (increase NEAT)., and Work on scheduling and tracking physical activity.    Pharmacotherapy We discussed various medication options to help Isao with his weight loss efforts and we both agreed to continue [facility] and increase to 0.5mg .  Side effects discussed.   ASSOCIATED CONDITIONS ADDRESSED TODAY  Action/Plan  Essential hypertension Continue meds as directed.  Keep follow up appt with cardiology.   Leukopenia, unspecified type Last labs wnl.  Will continue to monitor.  Lab discussed with patient today.   Morbid obesity (HCC) -     Increase Wegovy; Inject 0.5 mg into the skin once a week.  Dispense: 2 mL; Refill: 0  BMI 40.0-44.9, adult (HCC)     Labs reviewed  in chart from 08/27/23    Return in about 3 weeks (around 10/09/2023).Marland Kitchen He was informed of the importance of frequent follow up visits to maximize his success with intensive lifestyle modifications for his multiple health conditions.   ATTESTASTION STATEMENTS:  Reviewed by clinician on day of visit: allergies, medications, problem list, medical history, surgical history, family history, social history, and previous encounter notes.      Theodis Sato. Kandra Graven FNP-C

## 2023-10-12 NOTE — Progress Notes (Unsigned)
  Cardiology Office Note:   Date:  10/12/2023  ID:  Thomas Quant., DOB 12-Jan-1981, MRN [id number] PCP: Natalia Leatherwood, DO  [facility] Providers Cardiologist:  Rollene Rotunda, MD {  History of Present Illness:   Thomas [name]. is a 42 y.o. male  who presents for follow up of atrial fib, HTN and a CVA. He has been treated with tPA in past for his CVA.  He has had uncontrolled hypertension.     Since I last saw him he has started working with [facility].  He has started CPAP.  He reports that his blood pressure at home is in the 130s over 80s.  He started walking a little bit for exercise.  He switched jobs.  His mother-in-law who has some health issues moving in with him and they are expecting a baby in February. Thomas [name])  The patient denies any new symptoms such as chest discomfort, neck or arm discomfort. There has been no new shortness of breath, PND or orthopnea. There have been no reported palpitations, presyncope or syncope.    ROS: ***  Studies Reviewed:    EKG:       ***  Risk Assessment/Calculations:   {Does this patient have ATRIAL FIBRILLATION?:[phone]} No BP recorded.  {Refresh Note OR Click here to enter BP  :1}***        Physical Exam:   VS:  There were no vitals taken for this visit.   Wt Readings from Last 3 Encounters:  09/18/23 (!) 328 lb (148.8 kg)  08/27/23 (!) 334 lb (151.5 kg)  08/06/23 (!) 332 lb (150.6 kg)     GEN: Well nourished, well developed in no acute distress NECK: No JVD; No carotid bruits CARDIAC: ***RR, *** murmurs, rubs, gallops RESPIRATORY:  Clear to auscultation without rales, wheezing or rhonchi  ABDOMEN: Soft, non-tender, non-distended EXTREMITIES:  No edema; No deformity   ASSESSMENT AND PLAN:   ATRIAL FIB:  Mr. Tyshon Touma. has a [name]2DS2 - VASc score of 3.   ***  He tolerates anticoagulation.  No change in therapy.   CVA:   He has some mild residual left arm weakness.   ***  Continue  with risk reduction.   LVH : His last ejection fraction was 55 to 60%.  ***   will follow this up with repeat echoes in the future.    HTN:     ***  He is on good medical therapy.  I think he will come down hopefully with treatment of his sleep apnea which is just started and I suspect he will be on some weight loss medications in the future through [facility].  I will check with Dr. Duke Salvia to see that he does not qualify for any clinical trials.     OBESITY:   ***  He thinks he is losing weight by his scales and he will continue to work with the group as above.        Follow up ***  Signed, Rollene Rotunda, MD

## 2023-10-15 ENCOUNTER — Ambulatory Visit: Payer: No Typology Code available for payment source | Attending: Cardiology | Admitting: Cardiology

## 2023-10-15 ENCOUNTER — Encounter: Payer: Self-pay | Admitting: Cardiology

## 2023-10-15 ENCOUNTER — Encounter: Payer: Self-pay | Admitting: Nurse Practitioner

## 2023-10-15 ENCOUNTER — Ambulatory Visit: Payer: No Typology Code available for payment source | Admitting: Nurse Practitioner

## 2023-10-15 ENCOUNTER — Ambulatory Visit (INDEPENDENT_AMBULATORY_CARE_PROVIDER_SITE_OTHER): Payer: No Typology Code available for payment source | Admitting: Nurse Practitioner

## 2023-10-15 VITALS — BP 132/88 | HR 88 | Temp 98.0°F | Ht 73.0 in | Wt 326.0 lb

## 2023-10-15 VITALS — BP 154/96 | HR 64 | Ht 73.0 in | Wt 333.4 lb

## 2023-10-15 DIAGNOSIS — I517 Cardiomegaly: Secondary | ICD-10-CM | POA: Diagnosis not present

## 2023-10-15 DIAGNOSIS — G4733 Obstructive sleep apnea (adult) (pediatric): Secondary | ICD-10-CM

## 2023-10-15 DIAGNOSIS — I482 Chronic atrial fibrillation, unspecified: Secondary | ICD-10-CM | POA: Diagnosis not present

## 2023-10-15 DIAGNOSIS — Z6841 Body Mass Index (BMI) 40.0 and over, adult: Secondary | ICD-10-CM | POA: Diagnosis not present

## 2023-10-15 DIAGNOSIS — I10 Essential (primary) hypertension: Secondary | ICD-10-CM

## 2023-10-15 DIAGNOSIS — E559 Vitamin D deficiency, unspecified: Secondary | ICD-10-CM | POA: Diagnosis not present

## 2023-10-15 MED ORDER — WEGOVY 1 MG/0.5ML ~~LOC~~ SOAJ
1.0000 mg | SUBCUTANEOUS | 0 refills | Status: DC
Start: 2023-10-15 — End: 2023-12-16

## 2023-10-15 MED ORDER — VITAMIN D (ERGOCALCIFEROL) 1.25 MG (50000 UNIT) PO CAPS
50000.0000 [IU] | ORAL_CAPSULE | ORAL | 0 refills | Status: DC
Start: 2023-10-15 — End: 2023-11-04

## 2023-10-15 NOTE — Patient Instructions (Signed)
Medication Instructions:  No changes *If you need a refill on your cardiac medications before your next appointment, please call your pharmacy*  Follow-Up: At [facility], you and your health needs are our priority.  As part of our continuing mission to provide you with exceptional heart care, we have created designated Provider Care Teams.  These Care Teams include your primary Cardiologist (physician) and Advanced Practice Providers (APPs -  Physician Assistants and Nurse Practitioners) who all work together to provide you with the care you need, when you need it.  We recommend signing up for the patient portal called "MyChart".  Sign up information is provided on this After Visit Summary.  MyChart is used to connect with patients for Virtual Visits (Telemedicine).  Patients are able to view lab/test results, encounter notes, upcoming appointments, etc.  Non-urgent messages can be sent to your provider as well.   To learn more about what you can do with MyChart, go to [email or web address].    Your next appointment:   6 month(s)  Provider:   Minus Breeding, MD

## 2023-10-15 NOTE — Progress Notes (Signed)
Office: [phone]  /  Fax: [phone]  WEIGHT SUMMARY AND BIOMETRICS  Weight Lost Since Last Visit: 2lb  Weight Gained Since Last Visit: 0lb   Vitals Temp: 98 F (36.7 C) BP: 132/88 Pulse Rate: 88 SpO2: 98 %   Anthropometric Measurements Height: 6\' 1"  (1.854 m) Weight: (!) 326 lb (147.9 kg) BMI (Calculated): 43.02 Weight at Last Visit: 328lb Weight Lost Since Last Visit: 2lb Weight Gained Since Last Visit: 0lb Starting Weight: 326lb Total Weight Loss (lbs): 0 lb (0 kg)   Body Composition  Body Fat %: 37.3 % Fat Mass (lbs): 121.6 lbs Muscle Mass (lbs): 194.4 lbs Total Body Water (lbs): 146.8 lbs Visceral Fat Rating : 23   Other Clinical Data Fasting: No Labs: No Today's Visit #: 10 Starting Date: 06/27/22     HPI  Chief Complaint: OBESITY  Thomas [name] is here to discuss his progress with his obesity treatment plan. He is on the the Category 4 Plan and states he is following his eating plan approximately 40 % of the time. He states he is exercising 0 minutes 0 days per week.   Interval History:  Since last office visit he has lost 2 pounds.  He is eating 2 meals and 1 snack per day.  He is snacking on nuts/berries, cheese sticks and cottage cheese with fruit.  He working on increasing his water intake.     Pharmacotherapy for weight loss: He is currently taking Wegovy 0.5 mg for medical weight loss.  Denies side effects.    Wegovy approved through 03/18/24    Previous pharmacotherapy for medical weight loss:  None   Bariatric surgery:  Patient has not had bariatric surgery  Obstructive Sleep Apnea Kyaire has a diagnosis of sleep apnea. He reports that he is using a CPAP regularly.  Vit D deficiency  He is taking Vit D 50,000 IU weekly.  Denies side effects.  Denies nausea, vomiting or muscle weakness.    Lab Results  Component Value Date   VD25OH 27.3 (L) 08/06/2023   VD25OH 33.6 06/27/2022     PHYSICAL EXAM:  Blood pressure 132/88, pulse  88, temperature 98 F (36.7 C), height 6\' 1"  (1.854 m), weight (!) 326 lb (147.9 kg), SpO2 98%. Body mass index is 43.01 kg/m.  General: He is overweight, cooperative, alert, well developed, and in no acute distress. PSYCH: Has normal mood, affect and thought process.   Extremities: No edema.  Neurologic: No gross sensory or motor deficits. No tremors or fasciculations noted.    DIAGNOSTIC DATA REVIEWED:  BMET    Component Value Date/Time   NA 141 08/06/2023 1153   K 4.2 08/06/2023 1153   CL 105 08/06/2023 1153   CO2 21 08/06/2023 1153   GLUCOSE 93 08/06/2023 1153   GLUCOSE 92 03/29/2022 1525   BUN 22 08/06/2023 1153   CREATININE 1.52 (H) 08/06/2023 1153   CREATININE 1.61 (H) 03/29/2022 1525   CALCIUM 9.7 08/06/2023 1153   GFRNONAA 61 07/28/2020 0944   GFRAA 70 07/28/2020 0944   Lab Results  Component Value Date   HGBA1C 5.9 (H) 08/06/2023   HGBA1C  12/04/2008    5.5 (NOTE)   The ADA recommends the following therapeutic goal for glycemic   control related to Hgb A1C measurement:   Goal of Therapy:   < 7.0% Hgb A1C   Reference: American Diabetes Association: Clinical Practice   Recommendations 2008, Diabetes Care,  2008, 31:(Suppl 1).   Lab Results  Component Value Date  INSULIN 27.5 (H) 08/06/2023   Lab Results  Component Value Date   TSH 1.240 08/06/2023   CBC    Component Value Date/Time   WBC 3.7 08/27/2023 1232   WBC 3.5 (L) 03/29/2022 1525   RBC 4.66 08/27/2023 1232   RBC 4.85 03/29/2022 1525   HGB 13.7 08/27/2023 1232   HCT 42.2 08/27/2023 1232   PLT 155 08/27/2023 1232   MCV 91 08/27/2023 1232   MCH 29.4 08/27/2023 1232   MCH 28.9 03/29/2022 1525   MCHC 32.5 08/27/2023 1232   MCHC 32.6 03/29/2022 1525   RDW 14.2 08/27/2023 1232   Iron Studies No results found for: "IRON", "TIBC", "FERRITIN", "IRONPCTSAT" Lipid Panel     Component Value Date/Time   CHOL 120 08/06/2023 1153   TRIG 71 08/06/2023 1153   HDL 51 08/06/2023 1153   CHOLHDL 2.5  06/27/2022 1115   CHOLHDL 2 07/27/2021 0907   VLDL 11.0 07/27/2021 0907   LDLCALC 54 08/06/2023 1153   LDLCALC 82 04/09/2021 1417   Hepatic Function Panel     Component Value Date/Time   PROT 7.2 08/06/2023 1153   ALBUMIN 4.5 08/06/2023 1153   AST 34 08/06/2023 1153   ALT 31 08/06/2023 1153   ALKPHOS 53 08/06/2023 1153   BILITOT 0.5 08/06/2023 1153   BILIDIR 0.2 07/27/2021 0907      Component Value Date/Time   TSH 1.240 08/06/2023 1153   Nutritional Lab Results  Component Value Date   VD25OH 27.3 (L) 08/06/2023   VD25OH 33.6 06/27/2022     ASSESSMENT AND PLAN  TREATMENT PLAN FOR OBESITY:  Recommended Dietary Goals  Jahkari is currently in the action stage of change. As such, his goal is to continue weight management plan. He has agreed to the Category 4 Plan.  Behavioral Intervention  We discussed the following Behavioral Modification Strategies today: increasing lean protein intake to established goals, increasing water intake , reading food labels , keeping healthy foods at home, planning for success, and continue to work on maintaining a reduced calorie state, getting the recommended amount of protein, incorporating whole foods, making healthy choices, staying well hydrated and practicing mindfulness when eating..  Additional resources provided today: NA  Recommended Physical Activity Goals  Corneilius has been advised to work up to 150 minutes of moderate intensity aerobic activity a week and strengthening exercises 2-3 times per week for cardiovascular health, weight loss maintenance and preservation of muscle mass.   He has agreed to Think about enjoyable ways to increase daily physical activity and overcoming barriers to exercise, Increase physical activity in their day and reduce sedentary time (increase NEAT)., and Work on scheduling and tracking physical activity.    Pharmacotherapy We discussed various medication options to help Nell with his weight loss  efforts and we both agreed to increase Wegovy 1mg . Side effects discussed.  ASSOCIATED CONDITIONS ADDRESSED TODAY  Action/Plan  OSA on CPAP Continue CPAP nightly  Vitamin D deficiency -     Vitamin D (Ergocalciferol); Take 1 capsule (50,000 Units total) by mouth every 7 (seven) days.  Dispense: 5 capsule; Refill: 0  Morbid obesity (HCC) -     QMVHQI; Inject 1 mg into the skin once a week.  Dispense: 2 mL; Refill: 0  BMI 40.0-44.9, adult (HCC)         Return in about 3 weeks (around 11/05/2023).Marland Kitchen He was informed of the importance of frequent follow up visits to maximize his success with intensive lifestyle modifications for his multiple health  conditions.   ATTESTASTION STATEMENTS:  Reviewed by clinician on day of visit: allergies, medications, problem list, medical history, surgical history, family history, social history, and previous encounter notes.     Theodis Sato. Zari Cly FNP-C

## 2023-10-30 ENCOUNTER — Other Ambulatory Visit: Payer: Self-pay | Admitting: Cardiology

## 2023-11-04 ENCOUNTER — Ambulatory Visit (INDEPENDENT_AMBULATORY_CARE_PROVIDER_SITE_OTHER): Payer: No Typology Code available for payment source | Admitting: Nurse Practitioner

## 2023-11-04 ENCOUNTER — Encounter: Payer: Self-pay | Admitting: Nurse Practitioner

## 2023-11-04 VITALS — BP 133/79 | HR 84 | Temp 97.9°F | Ht 73.0 in | Wt 324.0 lb

## 2023-11-04 DIAGNOSIS — E559 Vitamin D deficiency, unspecified: Secondary | ICD-10-CM | POA: Diagnosis not present

## 2023-11-04 DIAGNOSIS — Z6841 Body Mass Index (BMI) 40.0 and over, adult: Secondary | ICD-10-CM

## 2023-11-04 MED ORDER — WEGOVY 1.7 MG/0.75ML ~~LOC~~ SOAJ: 1.7000 mg | SUBCUTANEOUS | 0 refills | Status: DC

## 2023-11-04 MED ORDER — VITAMIN D (ERGOCALCIFEROL) 1.25 MG (50000 UNIT) PO CAPS: 50000.0000 [IU] | ORAL_CAPSULE | ORAL | 0 refills | Status: DC

## 2023-11-04 NOTE — Progress Notes (Signed)
Office: [phone]  /  Fax: [phone]  WEIGHT SUMMARY AND BIOMETRICS  Weight Lost Since Last Visit: 2lb  Weight Gained Since Last Visit: 0lb   Vitals Temp: 97.9 F (36.6 C) BP: 133/79 Pulse Rate: 84 SpO2: 97 %   Anthropometric Measurements Height: 6\' 1"  (1.854 m) Weight: (!) 324 lb (147 kg) BMI (Calculated): 42.76 Weight at Last Visit: 326lb Weight Lost Since Last Visit: 2lb Weight Gained Since Last Visit: 0lb Starting Weight: 326lb Total Weight Loss (lbs): 2 lb (0.907 kg)   Body Composition  Body Fat %: 36 % Fat Mass (lbs): 116.6 lbs Muscle Mass (lbs): 197.4 lbs Total Body Water (lbs): 144.8 lbs Visceral Fat Rating : 21   Other Clinical Data Fasting: No Labs: No Today's Visit #: 11 Starting Date: 06/27/22     HPI  Chief Complaint: OBESITY  Barett is here to discuss his progress with his obesity treatment plan. He is on the the Category 4 Plan and states he is following his eating plan approximately 40 % of the time. He states he is exercising 0 minutes 0 days per week.   Interval History:  Since last office visit he has lost 2 pounds.  Has been watching his portion sizes and making healthier choices.  He is drinking water.  He has stopped coffee since his last visit.    Pharmacotherapy for weight loss: He is currently taking Wegovy 1 mg (x 3 doses) for medical weight loss.  Denies side effects.  Continues to note polyphagia.   Has helped with cravings.     Wegovy approved through 03/18/24    Previous pharmacotherapy for medical weight loss:  None   Bariatric surgery:  Patient has not had bariatric surgery  Vit D deficiency  He is taking Vit D 50,000 IU weekly.  Denies side effects.  Denies nausea, vomiting or muscle weakness.    Lab Results  Component Value Date   VD25OH 27.3 (L) 08/06/2023   VD25OH 33.6 06/27/2022     PHYSICAL EXAM:  Blood pressure 133/79, pulse 84, temperature 97.9 F (36.6 C), height 6\' 1"  (1.854 m), weight (!) 324  lb (147 kg), SpO2 97%. Body mass index is 42.75 kg/m.  General: He is overweight, cooperative, alert, well developed, and in no acute distress. PSYCH: Has normal mood, affect and thought process.   Extremities: No edema.  Neurologic: No gross sensory or motor deficits. No tremors or fasciculations noted.    DIAGNOSTIC DATA REVIEWED:  BMET    Component Value Date/Time   NA 141 08/06/2023 1153   K 4.2 08/06/2023 1153   CL 105 08/06/2023 1153   CO2 21 08/06/2023 1153   GLUCOSE 93 08/06/2023 1153   GLUCOSE 92 03/29/2022 1525   BUN 22 08/06/2023 1153   CREATININE 1.52 (H) 08/06/2023 1153   CREATININE 1.61 (H) 03/29/2022 1525   CALCIUM 9.7 08/06/2023 1153   GFRNONAA 61 07/28/2020 0944   GFRAA 70 07/28/2020 0944   Lab Results  Component Value Date   HGBA1C 5.9 (H) 08/06/2023   HGBA1C  12/04/2008    5.5 (NOTE)   The ADA recommends the following therapeutic goal for glycemic   control related to Hgb A1C measurement:   Goal of Therapy:   < 7.0% Hgb A1C   Reference: American Diabetes Association: Clinical Practice   Recommendations 2008, Diabetes Care,  2008, 31:(Suppl 1).   Lab Results  Component Value Date   INSULIN 27.5 (H) 08/06/2023   Lab Results  Component Value Date  TSH 1.240 08/06/2023   CBC    Component Value Date/Time   WBC 3.7 08/27/2023 1232   WBC 3.5 (L) 03/29/2022 1525   RBC 4.66 08/27/2023 1232   RBC 4.85 03/29/2022 1525   HGB 13.7 08/27/2023 1232   HCT 42.2 08/27/2023 1232   PLT 155 08/27/2023 1232   MCV 91 08/27/2023 1232   MCH 29.4 08/27/2023 1232   MCH 28.9 03/29/2022 1525   MCHC 32.5 08/27/2023 1232   MCHC 32.6 03/29/2022 1525   RDW 14.2 08/27/2023 1232   Iron Studies No results found for: "IRON", "TIBC", "FERRITIN", "IRONPCTSAT" Lipid Panel     Component Value Date/Time   CHOL 120 08/06/2023 1153   TRIG 71 08/06/2023 1153   HDL 51 08/06/2023 1153   CHOLHDL 2.5 06/27/2022 1115   CHOLHDL 2 07/27/2021 0907   VLDL 11.0 07/27/2021 0907    LDLCALC 54 08/06/2023 1153   LDLCALC 82 04/09/2021 1417   Hepatic Function Panel     Component Value Date/Time   PROT 7.2 08/06/2023 1153   ALBUMIN 4.5 08/06/2023 1153   AST 34 08/06/2023 1153   ALT 31 08/06/2023 1153   ALKPHOS 53 08/06/2023 1153   BILITOT 0.5 08/06/2023 1153   BILIDIR 0.2 07/27/2021 0907      Component Value Date/Time   TSH 1.240 08/06/2023 1153   Nutritional Lab Results  Component Value Date   VD25OH 27.3 (L) 08/06/2023   VD25OH 33.6 06/27/2022     ASSESSMENT AND PLAN  TREATMENT PLAN FOR OBESITY:  Recommended Dietary Goals  Jermari is currently in the action stage of change. As such, his goal is to continue weight management plan. He has agreed to the Category 4 Plan.  Behavioral Intervention  We discussed the following Behavioral Modification Strategies today: increasing lean protein intake to established goals, increasing water intake , work on meal planning and preparation, reading food labels , keeping healthy foods at home, and continue to work on maintaining a reduced calorie state, getting the recommended amount of protein, incorporating whole foods, making healthy choices, staying well hydrated and practicing mindfulness when eating..  Additional resources provided today: NA  Recommended Physical Activity Goals  Trigo has been advised to work up to 150 minutes of moderate intensity aerobic activity a week and strengthening exercises 2-3 times per week for cardiovascular health, weight loss maintenance and preservation of muscle mass.   He has agreed to Think about enjoyable ways to increase daily physical activity and overcoming barriers to exercise, Increase physical activity in their day and reduce sedentary time (increase NEAT)., and Work on scheduling and tracking physical activity.    Pharmacotherapy We discussed various medication options to help Alontae with his weight loss efforts and we both agreed to continue [facility] 1mg  x 1 week and  then increase to 1.7mg .  Side effects discussed.  ASSOCIATED CONDITIONS ADDRESSED TODAY  Action/Plan  Vitamin D deficiency -     Vitamin D (Ergocalciferol); Take 1 capsule (50,000 Units total) by mouth every 7 (seven) days.  Dispense: 5 capsule; Refill: 0  Morbid obesity (HCC) -     XBMWUX; Inject 1.7 mg into the skin once a week.  Dispense: 3 mL; Refill: 0  BMI 40.0-44.9, adult (HCC)         Return in about 4 weeks (around 12/02/2023).Marland Kitchen He was informed of the importance of frequent follow up visits to maximize his success with intensive lifestyle modifications for his multiple health conditions.   ATTESTASTION STATEMENTS:  Reviewed by clinician  on day of visit: allergies, medications, problem list, medical history, surgical history, family history, social history, and previous encounter notes.     Theodis Sato. Tonesha Tsou FNP-C

## 2023-12-08 ENCOUNTER — Ambulatory Visit: Payer: No Typology Code available for payment source | Admitting: Nurse Practitioner

## 2023-12-12 ENCOUNTER — Other Ambulatory Visit: Payer: Self-pay | Admitting: Nurse Practitioner

## 2023-12-16 ENCOUNTER — Other Ambulatory Visit: Payer: Self-pay | Admitting: Nurse Practitioner

## 2023-12-16 MED ORDER — WEGOVY 1.7 MG/0.75ML ~~LOC~~ SOAJ
1.7000 mg | SUBCUTANEOUS | 0 refills | Status: DC
Start: 2023-12-16 — End: 2023-12-23

## 2023-12-23 ENCOUNTER — Encounter: Payer: Self-pay | Admitting: Nurse Practitioner

## 2023-12-23 ENCOUNTER — Ambulatory Visit (INDEPENDENT_AMBULATORY_CARE_PROVIDER_SITE_OTHER): Payer: No Typology Code available for payment source | Admitting: Nurse Practitioner

## 2023-12-23 VITALS — BP 165/83 | HR 66 | Ht 73.0 in | Wt 321.0 lb

## 2023-12-23 DIAGNOSIS — E559 Vitamin D deficiency, unspecified: Secondary | ICD-10-CM

## 2023-12-23 DIAGNOSIS — I10 Essential (primary) hypertension: Secondary | ICD-10-CM

## 2023-12-23 DIAGNOSIS — Z6841 Body Mass Index (BMI) 40.0 and over, adult: Secondary | ICD-10-CM

## 2023-12-23 MED ORDER — WEGOVY 1.7 MG/0.75ML ~~LOC~~ SOAJ
1.7000 mg | SUBCUTANEOUS | 0 refills | Status: DC
Start: 2023-12-23 — End: 2024-01-14

## 2023-12-23 MED ORDER — VITAMIN D (ERGOCALCIFEROL) 1.25 MG (50000 UNIT) PO CAPS: 50000.0000 [IU] | ORAL_CAPSULE | ORAL | 0 refills | Status: DC

## 2023-12-23 NOTE — Progress Notes (Signed)
Office: [phone]  /  Fax: [phone]  WEIGHT SUMMARY AND BIOMETRICS  Weight Lost Since Last Visit: 3lb  Weight Gained Since Last Visit: 0lb   Vitals BP: (!) 165/83 Pulse Rate: 66 SpO2: 98 %   Anthropometric Measurements Height: 6\' 1"  (1.854 m) Weight: (!) 321 lb (145.6 kg) BMI (Calculated): 42.36 Weight at Last Visit: 324lb Weight Lost Since Last Visit: 3lb Weight Gained Since Last Visit: 0lb Starting Weight: 326lb Total Weight Loss (lbs): 5 lb (2.268 kg)   Body Composition  Body Fat %: 36.3 % Fat Mass (lbs): 116.8 lbs Muscle Mass (lbs): 194.8 lbs Total Body Water (lbs): 146.4 lbs Visceral Fat Rating : 21   Other Clinical Data Fasting: No Labs: No Today's Visit #: 12 Starting Date: 06/27/22     HPI  Chief Complaint: OBESITY  Thomas [name] is here to discuss his progress with his obesity treatment plan. He is on the the Category 3 Plan and states he is following his eating plan approximately 40 % of the time. He states he is walking for 45 minutes 4 days per week.   Interval History:  Since last office visit he has lost 3 pounds.  He reports that he did well over the holidays. He has been eating more at home and hasn't been eating out as much.  Drinking water but notes he needs to drink more.    Pharmacotherapy for weight loss: He is currently taking Wegovy 1.7 mg (x 2 doses) for medical weight loss.  Denies side effects.  Continues to note polyphagia.   Has helped with cravings.  He has done well with weight loss since starting Wegovy. He has lost a total of 11 pounds.     Wegovy approved through 03/18/24    Previous pharmacotherapy for medical weight loss:  None   Bariatric surgery:  Patient has not had bariatric surgery  Hypertension Hypertension poorly controlled.  Medication(s): catapres patch, diltiazem 360mg , lasix 20mg , hydralazine 100mg , labetalol 200mg , cozaar 100mg . He saw his PCP, at the [place], on Jan 3rd. Has appt scheduled with nephrology.    Denies chest pain, palpitations and SOB. FH: mother, father, both with HTN  BP Readings from Last 3 Encounters:  12/23/23 (!) 165/83  11/04/23 133/79  10/15/23 132/88   Lab Results  Component Value Date   CREATININE 1.52 (H) 08/06/2023   CREATININE 1.75 (H) 06/27/2022   CREATININE 1.61 (H) 03/29/2022    Vit D deficiency  He is taking Vit D 50,000 IU weekly.  Denies side effects.  Denies nausea, vomiting or muscle weakness.    Lab Results  Component Value Date   VD25OH 27.3 (L) 08/06/2023   VD25OH 33.6 06/27/2022     PHYSICAL EXAM:  Blood pressure (!) 165/83, pulse 66, height 6\' 1"  (1.854 m), weight (!) 321 lb (145.6 kg), SpO2 98%. Body mass index is 42.35 kg/m.  General: He is overweight, cooperative, alert, well developed, and in no acute distress. PSYCH: Has normal mood, affect and thought process.   Extremities: No edema.  Neurologic: No gross sensory or motor deficits. No tremors or fasciculations noted.    DIAGNOSTIC DATA REVIEWED:  BMET    Component Value Date/Time   NA 141 08/06/2023 1153   K 4.2 08/06/2023 1153   CL 105 08/06/2023 1153   CO2 21 08/06/2023 1153   GLUCOSE 93 08/06/2023 1153   GLUCOSE 92 03/29/2022 1525   BUN 22 08/06/2023 1153   CREATININE 1.52 (H) 08/06/2023 1153   CREATININE 1.61 (H) 03/29/2022  1525   CALCIUM 9.7 08/06/2023 1153   GFRNONAA 61 07/28/2020 0944   GFRAA 70 07/28/2020 0944   Lab Results  Component Value Date   HGBA1C 5.9 (H) 08/06/2023   HGBA1C  12/04/2008    5.5 (NOTE)   The ADA recommends the following therapeutic goal for glycemic   control related to Hgb A1C measurement:   Goal of Therapy:   < 7.0% Hgb A1C   Reference: American Diabetes Association: Clinical Practice   Recommendations 2008, Diabetes Care,  2008, 31:(Suppl 1).   Lab Results  Component Value Date   INSULIN 27.5 (H) 08/06/2023   Lab Results  Component Value Date   TSH 1.240 08/06/2023   CBC    Component Value Date/Time   WBC 3.7 08/27/2023  1232   WBC 3.5 (L) 03/29/2022 1525   RBC 4.66 08/27/2023 1232   RBC 4.85 03/29/2022 1525   HGB 13.7 08/27/2023 1232   HCT 42.2 08/27/2023 1232   PLT 155 08/27/2023 1232   MCV 91 08/27/2023 1232   MCH 29.4 08/27/2023 1232   MCH 28.9 03/29/2022 1525   MCHC 32.5 08/27/2023 1232   MCHC 32.6 03/29/2022 1525   RDW 14.2 08/27/2023 1232   Iron Studies No results found for: "IRON", "TIBC", "FERRITIN", "IRONPCTSAT" Lipid Panel     Component Value Date/Time   CHOL 120 08/06/2023 1153   TRIG 71 08/06/2023 1153   HDL 51 08/06/2023 1153   CHOLHDL 2.5 06/27/2022 1115   CHOLHDL 2 07/27/2021 0907   VLDL 11.0 07/27/2021 0907   LDLCALC 54 08/06/2023 1153   LDLCALC 82 04/09/2021 1417   Hepatic Function Panel     Component Value Date/Time   PROT 7.2 08/06/2023 1153   ALBUMIN 4.5 08/06/2023 1153   AST 34 08/06/2023 1153   ALT 31 08/06/2023 1153   ALKPHOS 53 08/06/2023 1153   BILITOT 0.5 08/06/2023 1153   BILIDIR 0.2 07/27/2021 0907      Component Value Date/Time   TSH 1.240 08/06/2023 1153   Nutritional Lab Results  Component Value Date   VD25OH 27.3 (L) 08/06/2023   VD25OH 33.6 06/27/2022     ASSESSMENT AND PLAN  TREATMENT PLAN FOR OBESITY:  Recommended Dietary Goals  Thomas [name] is currently in the action stage of change. As such, his goal is to continue weight management plan. He has agreed to the Category 3 Plan.  Behavioral Intervention  We discussed the following Behavioral Modification Strategies today: increasing lean protein intake to established goals, decreasing simple carbohydrates , increasing vegetables, increasing water intake , work on meal planning and preparation, reading food labels , keeping healthy foods at home, continue to work on implementation of reduced calorie nutritional plan, continue to practice mindfulness when eating, and continue to work on maintaining a reduced calorie state, getting the recommended amount of protein, incorporating whole foods,  making healthy choices, staying well hydrated and practicing mindfulness when eating..  Additional resources provided today: NA  Recommended Physical Activity Goals  Thomas [name] has been advised to work up to 150 minutes of moderate intensity aerobic activity a week and strengthening exercises 2-3 times per week for cardiovascular health, weight loss maintenance and preservation of muscle mass.   He has agreed to Continue current level of physical activity    Pharmacotherapy We discussed various medication options to help Graisen with his weight loss efforts and we both agreed to continue Wegovy 1.7mg .  Side effects discussed.  ASSOCIATED CONDITIONS ADDRESSED TODAY  Action/Plan  Vitamin D deficiency -  Vitamin D (Ergocalciferol); Take 1 capsule (50,000 Units total) by mouth every 7 (seven) days.  Dispense: 5 capsule; Refill: 0  Low Vitamin D level contributes to fatigue and are associated with obesity, breast, and colon cancer. He agrees to continue to take prescription Vitamin D @50 ,000 IU every week and will follow-up for routine testing of Vitamin D, at least 2-3 times per year to avoid over-replacement.   Essential hypertension Keep appt with nephrology.  Continue meds as directed  Morbid obesity (HCC) -     WUJWJX; Inject 1.7 mg into the skin once a week.  Dispense: 3 mL; Refill: 0  BMI 40.0-44.9, adult (HCC)      Reports having labs at the [facility] on Jan 3rd.  To send to me labs via mychart.    Return in about 4 weeks (around 01/20/2024).Marland Kitchen He was informed of the importance of frequent follow up visits to maximize his success with intensive lifestyle modifications for his multiple health conditions.   ATTESTASTION STATEMENTS:  Reviewed by clinician on day of visit: allergies, medications, problem list, medical history, surgical history, family history, social history, and previous encounter notes.     Theodis Sato. Rogenia Werntz FNP-C

## 2023-12-26 ENCOUNTER — Encounter: Payer: Self-pay | Admitting: Nurse Practitioner

## 2024-01-10 ENCOUNTER — Other Ambulatory Visit: Payer: Self-pay | Admitting: Nurse Practitioner

## 2024-01-10 DIAGNOSIS — E559 Vitamin D deficiency, unspecified: Secondary | ICD-10-CM

## 2024-01-14 ENCOUNTER — Encounter: Payer: Self-pay | Admitting: Nurse Practitioner

## 2024-01-14 ENCOUNTER — Ambulatory Visit (INDEPENDENT_AMBULATORY_CARE_PROVIDER_SITE_OTHER): Payer: No Typology Code available for payment source | Admitting: Nurse Practitioner

## 2024-01-14 VITALS — BP 157/97 | HR 82 | Temp 97.7°F | Ht 73.0 in | Wt 314.0 lb

## 2024-01-14 DIAGNOSIS — E559 Vitamin D deficiency, unspecified: Secondary | ICD-10-CM

## 2024-01-14 DIAGNOSIS — Z6841 Body Mass Index (BMI) 40.0 and over, adult: Secondary | ICD-10-CM | POA: Diagnosis not present

## 2024-01-14 DIAGNOSIS — I10 Essential (primary) hypertension: Secondary | ICD-10-CM | POA: Diagnosis not present

## 2024-01-14 MED ORDER — WEGOVY 1.7 MG/0.75ML ~~LOC~~ SOAJ
1.7000 mg | SUBCUTANEOUS | 0 refills | Status: DC
Start: 2024-01-14 — End: 2024-02-12

## 2024-01-14 MED ORDER — VITAMIN D (ERGOCALCIFEROL) 1.25 MG (50000 UNIT) PO CAPS: 50000.0000 [IU] | ORAL_CAPSULE | ORAL | 0 refills | Status: DC

## 2024-01-14 NOTE — Progress Notes (Deleted)
Office: [phone]  /  Fax: [phone]  WEIGHT SUMMARY AND BIOMETRICS  Weight Lost Since Last Visit: 7lb  Weight Gained Since Last Visit: 0   Vitals Temp: 97.7 F (36.5 C) BP: (!) 190/138 Pulse Rate: 82 SpO2: 99 %   Anthropometric Measurements Height: 6\' 1"  (1.854 m) Weight: (!) 314 lb (142.4 kg) BMI (Calculated): 41.44 Weight at Last Visit: 321lb Weight Lost Since Last Visit: 7lb Weight Gained Since Last Visit: 0 Starting Weight: 326lb Total Weight Loss (lbs): 12 lb (5.443 kg)   Body Composition  Body Fat %: 35.3 % Fat Mass (lbs): 111 lbs Muscle Mass (lbs): 193.8 lbs Total Body Water (lbs): 141.6 lbs Visceral Fat Rating : 20   Other Clinical Data Fasting: no Labs: no Today's Visit #: 13 Starting Date: 06/27/22     HPI  Chief Complaint: OBESITY  Thomas [name] is here to discuss his progress with his obesity treatment plan. He is on the the Category 3 Plan and states he is following his eating plan approximately 30 % of the time. He states he is exercising 240 minutes 3 days per week.   Interval History:  Since last office visit he ***   Pharmacotherapy for weight loss: He {srtis (Optional):29129} currently taking {srtpreviousweightlossmeds (Optional):29124} for medical weight loss.  Denies side effects.    Previous pharmacotherapy for medical weight loss:  {srtpreviousweightlossmeds (Optional):29124}  He stopped *** due to side effects of ***.  Bariatric surgery:  Patient is status post {srtweightlosssurgery:29125} by Dr.  Marland Kitchen in ***.  His highest weight prior to surgery was *** and his nadir weight after surgery was ***.  He is taking {srtvitamins (Optional):29126}.  He reports {srtrestriction (Optional):29127} restriction.     PHYSICAL EXAM:  Blood pressure (!) 190/138, pulse 82, temperature 97.7 F (36.5 C), height 6\' 1"  (1.854 m), weight (!) 314 lb (142.4 kg), SpO2 99%. Body mass index is 41.43 kg/m.  General: He is overweight, cooperative,  alert, well developed, and in no acute distress. PSYCH: Has normal mood, affect and thought process.   Extremities: No edema.  Neurologic: No gross sensory or motor deficits. No tremors or fasciculations noted.    DIAGNOSTIC DATA REVIEWED:  BMET    Component Value Date/Time   NA 141 08/06/2023 1153   K 4.2 08/06/2023 1153   CL 105 08/06/2023 1153   CO2 21 08/06/2023 1153   GLUCOSE 93 08/06/2023 1153   GLUCOSE 92 03/29/2022 1525   BUN 22 08/06/2023 1153   CREATININE 1.52 (H) 08/06/2023 1153   CREATININE 1.61 (H) 03/29/2022 1525   CALCIUM 9.7 08/06/2023 1153   GFRNONAA 61 07/28/2020 0944   GFRAA 70 07/28/2020 0944   Lab Results  Component Value Date   HGBA1C 5.9 (H) 08/06/2023   HGBA1C  12/04/2008    5.5 (NOTE)   The ADA recommends the following therapeutic goal for glycemic   control related to Hgb A1C measurement:   Goal of Therapy:   < 7.0% Hgb A1C   Reference: American Diabetes Association: Clinical Practice   Recommendations 2008, Diabetes Care,  2008, 31:(Suppl 1).   Lab Results  Component Value Date   INSULIN 27.5 (H) 08/06/2023   Lab Results  Component Value Date   TSH 1.240 08/06/2023   CBC    Component Value Date/Time   WBC 3.7 08/27/2023 1232   WBC 3.5 (L) 03/29/2022 1525   RBC 4.66 08/27/2023 1232   RBC 4.85 03/29/2022 1525   HGB 13.7 08/27/2023 1232   HCT 42.2 08/27/2023  1232   PLT 155 08/27/2023 1232   MCV 91 08/27/2023 1232   MCH 29.4 08/27/2023 1232   MCH 28.9 03/29/2022 1525   MCHC 32.5 08/27/2023 1232   MCHC 32.6 03/29/2022 1525   RDW 14.2 08/27/2023 1232   Iron Studies No results found for: "IRON", "TIBC", "FERRITIN", "IRONPCTSAT" Lipid Panel     Component Value Date/Time   CHOL 120 08/06/2023 1153   TRIG 71 08/06/2023 1153   HDL 51 08/06/2023 1153   CHOLHDL 2.5 06/27/2022 1115   CHOLHDL 2 07/27/2021 0907   VLDL 11.0 07/27/2021 0907   LDLCALC 54 08/06/2023 1153   LDLCALC 82 04/09/2021 1417   Hepatic Function Panel     Component  Value Date/Time   PROT 7.2 08/06/2023 1153   ALBUMIN 4.5 08/06/2023 1153   AST 34 08/06/2023 1153   ALT 31 08/06/2023 1153   ALKPHOS 53 08/06/2023 1153   BILITOT 0.5 08/06/2023 1153   BILIDIR 0.2 07/27/2021 0907      Component Value Date/Time   TSH 1.240 08/06/2023 1153   Nutritional Lab Results  Component Value Date   VD25OH 27.3 (L) 08/06/2023   VD25OH 33.6 06/27/2022     ASSESSMENT AND PLAN  TREATMENT PLAN FOR OBESITY:  Recommended Dietary Goals  Thomas [name] is currently in the action stage of change. As such, his goal is to continue weight management plan. He has agreed to {MWMwtlossportion/plan2:23431}.  Behavioral Intervention  We discussed the following Behavioral Modification Strategies today: {EMWMwtlossstrategies:28914::"continue to work on maintaining a reduced calorie state, getting the recommended amount of protein, incorporating whole foods, making healthy choices, staying well hydrated and practicing mindfulness when eating."}.  Additional resources provided today: NA  Recommended Physical Activity Goals  Thomas [name] has been advised to work up to 150 minutes of moderate intensity aerobic activity a week and strengthening exercises 2-3 times per week for cardiovascular health, weight loss maintenance and preservation of muscle mass.   He has agreed to {EMEXERCISE:28847::"Think about enjoyable ways to increase daily physical activity and overcoming barriers to exercise","Increase physical activity in their day and reduce sedentary time (increase NEAT)."}   Pharmacotherapy We discussed various medication options to help Thomas [name] with his weight loss efforts and we both agreed to ***.  ASSOCIATED CONDITIONS ADDRESSED TODAY  Action/Plan  Vitamin D deficiency  Essential hypertension  Morbid obesity (HCC)         Return in about 4 weeks (around 02/11/2024).Marland Kitchen He was informed of the importance of frequent follow up visits to maximize his success with intensive  lifestyle modifications for his multiple health conditions.   ATTESTASTION STATEMENTS:  Reviewed by clinician on day of visit: allergies, medications, problem list, medical history, surgical history, family history, social history, and previous encounter notes.   Time spent on visit including pre-visit chart review and post-visit care and charting was *** minutes.    Theodis Sato. Tickerhoff FNP-C

## 2024-01-14 NOTE — Progress Notes (Signed)
Office: [phone]  /  Fax: [phone]  WEIGHT SUMMARY AND BIOMETRICS  Weight Lost Since Last Visit: 7lb  Weight Gained Since Last Visit: 0   Vitals Temp: 97.7 F (36.5 C) BP: (!) 157/97 Pulse Rate: 82 SpO2: 99 %   Anthropometric Measurements Height: 6\' 1"  (1.854 m) Weight: (!) 314 lb (142.4 kg) BMI (Calculated): 41.44 Weight at Last Visit: 321lb Weight Lost Since Last Visit: 7lb Weight Gained Since Last Visit: 0 Starting Weight: 326lb Total Weight Loss (lbs): 12 lb (5.443 kg)   Body Composition  Body Fat %: 35.3 % Fat Mass (lbs): 111 lbs Muscle Mass (lbs): 193.8 lbs Total Body Water (lbs): 141.6 lbs Visceral Fat Rating : 20   Other Clinical Data Fasting: no Labs: no Today's Visit #: 13 Starting Date: 06/27/22     HPI  Chief Complaint: OBESITY  Thomas [name] is here to discuss his progress with his obesity treatment plan. He is on the the Category 3 Plan and states he is following his eating plan approximately 30 % of the time. He states he is walking at work.   Interval History:  Since last office visit he has lost 7 pounds. He is following his meal plan off and on.  At times is skipping meals.   He is drinking water and zero sugar sodas.   No upcoming celebrations or traveling  Patient sent me labs from the [place] via [facility] on 12/26/23  Pharmacotherapy for weight loss: He is currently taking Wegovy 1.7 mg for medical weight loss.  Denies side effects.   He has lost a total of 18 pounds since starting [facility] on 08/06/23.   Wegovy approved through 03/18/24    Previous pharmacotherapy for medical weight loss:  None   Bariatric surgery:  Patient has not had bariatric surgery  Vit D deficiency  He is taking Vit D 50,000 IU weekly.  Denies side effects.  Denies nausea, vomiting or muscle weakness.    Lab Results  Component Value Date   VD25OH 27.3 (L) 08/06/2023   VD25OH 33.6 06/27/2022     Hypertension Hypertension poorly controlled. Has appt with  nephrology on April 16th.   Medication(s): catapres patch, diltiazem 360mg , lasix 20mg , hydralazine 100mg , labetalol 200mg , cozaar 100mg .  Denies chest pain, palpitations and SOB.  BP Readings from Last 3 Encounters:  01/14/24 (!) 157/97  12/23/23 (!) 165/83  11/04/23 133/79   Lab Results  Component Value Date   CREATININE 1.52 (H) 08/06/2023   CREATININE 1.75 (H) 06/27/2022   CREATININE 1.61 (H) 03/29/2022      PHYSICAL EXAM:  Blood pressure (!) 157/97, pulse 82, temperature 97.7 F (36.5 C), height 6\' 1"  (1.854 m), weight (!) 314 lb (142.4 kg), SpO2 99%. Body mass index is 41.43 kg/m.  General: He is overweight, cooperative, alert, well developed, and in no acute distress. PSYCH: Has normal mood, affect and thought process.   Extremities: No edema.  Neurologic: No gross sensory or motor deficits. No tremors or fasciculations noted.    DIAGNOSTIC DATA REVIEWED:  BMET    Component Value Date/Time   NA 141 08/06/2023 1153   K 4.2 08/06/2023 1153   CL 105 08/06/2023 1153   CO2 21 08/06/2023 1153   GLUCOSE 93 08/06/2023 1153   GLUCOSE 92 03/29/2022 1525   BUN 22 08/06/2023 1153   CREATININE 1.52 (H) 08/06/2023 1153   CREATININE 1.61 (H) 03/29/2022 1525   CALCIUM 9.7 08/06/2023 1153   GFRNONAA 61 07/28/2020 0944   GFRAA 70 07/28/2020  0981   Lab Results  Component Value Date   HGBA1C 5.9 (H) 08/06/2023   HGBA1C  12/04/2008    5.5 (NOTE)   The ADA recommends the following therapeutic goal for glycemic   control related to Hgb A1C measurement:   Goal of Therapy:   < 7.0% Hgb A1C   Reference: American Diabetes Association: Clinical Practice   Recommendations 2008, Diabetes Care,  2008, 31:(Suppl 1).   Lab Results  Component Value Date   INSULIN 27.5 (H) 08/06/2023   Lab Results  Component Value Date   TSH 1.240 08/06/2023   CBC    Component Value Date/Time   WBC 3.7 08/27/2023 1232   WBC 3.5 (L) 03/29/2022 1525   RBC 4.66 08/27/2023 1232   RBC 4.85  03/29/2022 1525   HGB 13.7 08/27/2023 1232   HCT 42.2 08/27/2023 1232   PLT 155 08/27/2023 1232   MCV 91 08/27/2023 1232   MCH 29.4 08/27/2023 1232   MCH 28.9 03/29/2022 1525   MCHC 32.5 08/27/2023 1232   MCHC 32.6 03/29/2022 1525   RDW 14.2 08/27/2023 1232   Iron Studies No results found for: "IRON", "TIBC", "FERRITIN", "IRONPCTSAT" Lipid Panel     Component Value Date/Time   CHOL 120 08/06/2023 1153   TRIG 71 08/06/2023 1153   HDL 51 08/06/2023 1153   CHOLHDL 2.5 06/27/2022 1115   CHOLHDL 2 07/27/2021 0907   VLDL 11.0 07/27/2021 0907   LDLCALC 54 08/06/2023 1153   LDLCALC 82 04/09/2021 1417   Hepatic Function Panel     Component Value Date/Time   PROT 7.2 08/06/2023 1153   ALBUMIN 4.5 08/06/2023 1153   AST 34 08/06/2023 1153   ALT 31 08/06/2023 1153   ALKPHOS 53 08/06/2023 1153   BILITOT 0.5 08/06/2023 1153   BILIDIR 0.2 07/27/2021 0907      Component Value Date/Time   TSH 1.240 08/06/2023 1153   Nutritional Lab Results  Component Value Date   VD25OH 27.3 (L) 08/06/2023   VD25OH 33.6 06/27/2022     ASSESSMENT AND PLAN  TREATMENT PLAN FOR OBESITY:  Recommended Dietary Goals  Thomas [name] is currently in the action stage of change. As such, his goal is to continue weight management plan. He has agreed to the Category 3 Plan.  Behavioral Intervention  We discussed the following Behavioral Modification Strategies today: increasing lean protein intake to established goals, decreasing simple carbohydrates , increasing vegetables, increasing water intake , work on meal planning and preparation, reading food labels , keeping healthy foods at home, continue to work on implementation of reduced calorie nutritional plan, continue to practice mindfulness when eating, planning for success, and continue to work on maintaining a reduced calorie state, getting the recommended amount of protein, incorporating whole foods, making healthy choices, staying well hydrated and  practicing mindfulness when eating..  Additional resources provided today: NA  Recommended Physical Activity Goals  Thomas [name] has been advised to work up to 150 minutes of moderate intensity aerobic activity a week and strengthening exercises 2-3 times per week for cardiovascular health, weight loss maintenance and preservation of muscle mass.   He has agreed to Think about enjoyable ways to increase daily physical activity and overcoming barriers to exercise, Increase physical activity in their day and reduce sedentary time (increase NEAT)., and Work on scheduling and tracking physical activity.    Pharmacotherapy We discussed various medication options to help Thomas [name] with his weight loss efforts and we both agreed to continue [facility] 1.7mg .  Side effects discusssed.  Avoid phentermine, Qsymia and Contrave due to hypertension, atrial fibrillation, CKD, CHF and history of CVA.   Avoid orlistat due to a history of vitamin D deficiency  ASSOCIATED CONDITIONS ADDRESSED TODAY  Action/Plan  Vitamin D deficiency -     Vitamin D (Ergocalciferol); Take 1 capsule (50,000 Units total) by mouth every 7 (seven) days.  Dispense: 5 capsule; Refill: 0  Essential hypertension Needs to reach out to nephrology for poorly controlled HTN.  Keep follow-up appointments and continue medications as directed.  Morbid obesity (HCC) -     WUJWJX; Inject 1.7 mg into the skin once a week.  Dispense: 3 mL; Refill: 0  BMI 40.0-44.9, adult (HCC)         Return in about 4 weeks (around 02/11/2024).Marland Kitchen He was informed of the importance of frequent follow up visits to maximize his success with intensive lifestyle modifications for his multiple health conditions.   ATTESTASTION STATEMENTS:  Reviewed by clinician on day of visit: allergies, medications, problem list, medical history, surgical history, family history, social history, and previous encounter notes.     Theodis Sato. Jacqueleen Pulver FNP-C

## 2024-02-06 ENCOUNTER — Other Ambulatory Visit: Payer: Self-pay | Admitting: Nurse Practitioner

## 2024-02-09 ENCOUNTER — Other Ambulatory Visit: Payer: Self-pay | Admitting: Nurse Practitioner

## 2024-02-09 DIAGNOSIS — E559 Vitamin D deficiency, unspecified: Secondary | ICD-10-CM

## 2024-02-12 ENCOUNTER — Encounter: Payer: Self-pay | Admitting: Nurse Practitioner

## 2024-02-12 ENCOUNTER — Ambulatory Visit (INDEPENDENT_AMBULATORY_CARE_PROVIDER_SITE_OTHER): Payer: No Typology Code available for payment source | Admitting: Nurse Practitioner

## 2024-02-12 VITALS — BP 129/95 | HR 74 | Temp 98.1°F | Ht 73.0 in | Wt 318.0 lb

## 2024-02-12 DIAGNOSIS — E66813 Obesity, class 3: Secondary | ICD-10-CM

## 2024-02-12 DIAGNOSIS — E559 Vitamin D deficiency, unspecified: Secondary | ICD-10-CM

## 2024-02-12 DIAGNOSIS — Z6841 Body Mass Index (BMI) 40.0 and over, adult: Secondary | ICD-10-CM

## 2024-02-12 MED ORDER — WEGOVY 2.4 MG/0.75ML ~~LOC~~ SOAJ: 2.4000 mg | SUBCUTANEOUS | 0 refills | Status: DC

## 2024-02-12 MED ORDER — VITAMIN D (ERGOCALCIFEROL) 1.25 MG (50000 UNIT) PO CAPS: 50000.0000 [IU] | ORAL_CAPSULE | ORAL | 0 refills | Status: DC

## 2024-02-12 NOTE — Progress Notes (Signed)
 Office: [phone]  /  Fax: [phone]  WEIGHT SUMMARY AND BIOMETRICS  Weight Lost Since Last Visit: 0lb  Weight Gained Since Last Visit: 4lb   Vitals Temp: 98.1 F (36.7 C) BP: (!) 129/95 Pulse Rate: 74 SpO2: 98 %   Anthropometric Measurements Height: 6\' 1"  (1.854 m) Weight: (!) 318 lb (144.2 kg) BMI (Calculated): 41.96 Weight at Last Visit: 314lb Weight Lost Since Last Visit: 0lb Weight Gained Since Last Visit: 4lb Starting Weight: 326lb Total Weight Loss (lbs): 8 lb (3.629 kg)   Body Composition  Body Fat %: 36.7 % Fat Mass (lbs): 117 lbs Muscle Mass (lbs): 191.8 lbs Total Body Water (lbs): 146.6 lbs Visceral Fat Rating : 21   Other Clinical Data Fasting: No Labs: No Today's Visit #: 14 Starting Date: 06/27/22     HPI  Chief Complaint: OBESITY  Thomas [name] is here to discuss his progress with his obesity treatment plan. He is on the the Category 4 Plan and states he is following his eating plan approximately 50 % of the time. He states he is exercising 30 minutes 4 days per week.   Interval History:  Since last office visit he has gained 4 pounds. Due to work schedule and some stress at home, he has been struggling with meeting calories and protein goals.   He is working on Field seismologist intake.  Denies sugary drinks.    Pharmacotherapy for weight loss: He is currently taking Wegovy 1.7 mg for medical weight loss.  Denies side effects.   He has lost a total of 14 pounds since starting [facility] on 08/06/23.   Wegovy approved through 03/18/24    Previous pharmacotherapy for medical weight loss:  None   Bariatric surgery:  Patient has not had bariatric surgery  Vit D deficiency  He is taking Vit D 50,000 IU weekly.  Denies side effects.  Denies nausea, vomiting or muscle weakness.    Lab Results  Component Value Date   VD25OH 27.3 (L) 08/06/2023   VD25OH 33.6 06/27/2022     PHYSICAL EXAM:  Blood pressure (!) 129/95, pulse 74, temperature 98.1  F (36.7 C), height 6\' 1"  (1.854 m), weight (!) 318 lb (144.2 kg), SpO2 98%. Body mass index is 41.96 kg/m.  General: He is overweight, cooperative, alert, well developed, and in no acute distress. PSYCH: Has normal mood, affect and thought process.   Extremities: No edema.  Neurologic: No gross sensory or motor deficits. No tremors or fasciculations noted.    DIAGNOSTIC DATA REVIEWED:  BMET    Component Value Date/Time   NA 141 08/06/2023 1153   K 4.2 08/06/2023 1153   CL 105 08/06/2023 1153   CO2 21 08/06/2023 1153   GLUCOSE 93 08/06/2023 1153   GLUCOSE 92 03/29/2022 1525   BUN 22 08/06/2023 1153   CREATININE 1.52 (H) 08/06/2023 1153   CREATININE 1.61 (H) 03/29/2022 1525   CALCIUM 9.7 08/06/2023 1153   GFRNONAA 61 07/28/2020 0944   GFRAA 70 07/28/2020 0944   Lab Results  Component Value Date   HGBA1C 5.9 (H) 08/06/2023   HGBA1C  12/04/2008    5.5 (NOTE)   The ADA recommends the following therapeutic goal for glycemic   control related to Hgb A1C measurement:   Goal of Therapy:   < 7.0% Hgb A1C   Reference: American Diabetes Association: Clinical Practice   Recommendations 2008, Diabetes Care,  2008, 31:(Suppl 1).   Lab Results  Component Value Date   INSULIN 27.5 (H) 08/06/2023  Lab Results  Component Value Date   TSH 1.240 08/06/2023   CBC    Component Value Date/Time   WBC 3.7 08/27/2023 1232   WBC 3.5 (L) 03/29/2022 1525   RBC 4.66 08/27/2023 1232   RBC 4.85 03/29/2022 1525   HGB 13.7 08/27/2023 1232   HCT 42.2 08/27/2023 1232   PLT 155 08/27/2023 1232   MCV 91 08/27/2023 1232   MCH 29.4 08/27/2023 1232   MCH 28.9 03/29/2022 1525   MCHC 32.5 08/27/2023 1232   MCHC 32.6 03/29/2022 1525   RDW 14.2 08/27/2023 1232   Iron Studies No results found for: "IRON", "TIBC", "FERRITIN", "IRONPCTSAT" Lipid Panel     Component Value Date/Time   CHOL 120 08/06/2023 1153   TRIG 71 08/06/2023 1153   HDL 51 08/06/2023 1153   CHOLHDL 2.5 06/27/2022 1115    CHOLHDL 2 07/27/2021 0907   VLDL 11.0 07/27/2021 0907   LDLCALC 54 08/06/2023 1153   LDLCALC 82 04/09/2021 1417   Hepatic Function Panel     Component Value Date/Time   PROT 7.2 08/06/2023 1153   ALBUMIN 4.5 08/06/2023 1153   AST 34 08/06/2023 1153   ALT 31 08/06/2023 1153   ALKPHOS 53 08/06/2023 1153   BILITOT 0.5 08/06/2023 1153   BILIDIR 0.2 07/27/2021 0907      Component Value Date/Time   TSH 1.240 08/06/2023 1153   Nutritional Lab Results  Component Value Date   VD25OH 27.3 (L) 08/06/2023   VD25OH 33.6 06/27/2022     ASSESSMENT AND PLAN  TREATMENT PLAN FOR OBESITY:  Recommended Dietary Goals  Thomas [name] is currently in the action stage of change. As such, his goal is to continue weight management plan. He has agreed to the Category 4 Plan.  Behavioral Intervention  We discussed the following Behavioral Modification Strategies today: increasing lean protein intake to established goals, decreasing simple carbohydrates , increasing vegetables, increasing fiber rich foods, avoiding skipping meals, increasing water intake , continue to work on implementation of reduced calorie nutritional plan, continue to practice mindfulness when eating, planning for success, and continue to work on maintaining a reduced calorie state, getting the recommended amount of protein, incorporating whole foods, making healthy choices, staying well hydrated and practicing mindfulness when eating..Discussed stress  management.    Additional resources provided today: NA  Recommended Physical Activity Goals  Thomas [name] has been advised to work up to 150 minutes of moderate intensity aerobic activity a week and strengthening exercises 2-3 times per week for cardiovascular health, weight loss maintenance and preservation of muscle mass.   He has agreed to Think about enjoyable ways to increase daily physical activity and overcoming barriers to exercise, Increase physical activity in their day and reduce  sedentary time (increase NEAT)., Increase the intensity, frequency or duration of strengthening exercises , and Increase the intensity, frequency or duration of aerobic exercises     Pharmacotherapy We discussed various medication options to help Thomas [name] with his weight loss efforts and we both agreed to increase Wegovy to 2.4mg . Side effects discussed.  ASSOCIATED CONDITIONS ADDRESSED TODAY  Action/Plan  Vitamin D deficiency -     Vitamin D (Ergocalciferol); Take 1 capsule (50,000 Units total) by mouth every 7 (seven) days.  Dispense: 5 capsule; Refill: 0  Morbid obesity (HCC) -     Increase Wegovy; Inject 2.4 mg into the skin once a week.  Dispense: 3 mL; Refill: 0. Side effects discussed.   Class 3 severe obesity due to excess calories with serious comorbidity  and body mass index (BMI) of 40.0 to 44.9 in adult [facility]) -     Wegovy; Inject 2.4 mg into the skin once a week.  Dispense: 3 mL; Refill: 0         Return in about 4 weeks (around 03/11/2024).Marland Kitchen He was informed of the importance of frequent follow up visits to maximize his success with intensive lifestyle modifications for his multiple health conditions.   ATTESTASTION STATEMENTS:  Reviewed by clinician on day of visit: allergies, medications, problem list, medical history, surgical history, family history, social history, and previous encounter notes.     Theodis Sato. Thomas Boyajian FNP-C

## 2024-03-15 ENCOUNTER — Ambulatory Visit (INDEPENDENT_AMBULATORY_CARE_PROVIDER_SITE_OTHER): Admitting: Nurse Practitioner

## 2024-03-15 ENCOUNTER — Encounter: Payer: Self-pay | Admitting: Nurse Practitioner

## 2024-03-15 VITALS — BP 145/85 | HR 78 | Temp 98.0°F | Ht 73.0 in | Wt 315.0 lb

## 2024-03-15 DIAGNOSIS — E559 Vitamin D deficiency, unspecified: Secondary | ICD-10-CM

## 2024-03-15 DIAGNOSIS — Z6841 Body Mass Index (BMI) 40.0 and over, adult: Secondary | ICD-10-CM

## 2024-03-15 DIAGNOSIS — G4733 Obstructive sleep apnea (adult) (pediatric): Secondary | ICD-10-CM | POA: Diagnosis not present

## 2024-03-15 DIAGNOSIS — E66813 Obesity, class 3: Secondary | ICD-10-CM | POA: Diagnosis not present

## 2024-03-15 MED ORDER — WEGOVY 2.4 MG/0.75ML ~~LOC~~ SOAJ: 2.4000 mg | SUBCUTANEOUS | 0 refills | Status: DC

## 2024-03-15 MED ORDER — VITAMIN D (ERGOCALCIFEROL) 1.25 MG (50000 UNIT) PO CAPS: 50000.0000 [IU] | ORAL_CAPSULE | ORAL | 0 refills | Status: DC

## 2024-03-15 NOTE — Progress Notes (Signed)
 Office: [phone]  /  Fax: [phone]  WEIGHT SUMMARY AND BIOMETRICS  Weight Lost Since Last Visit: 3lb  Weight Gained Since Last Visit: 0lb   Vitals Temp: 98 F (36.7 C) BP: (!) 145/85 Pulse Rate: 78 SpO2: 98 %   Anthropometric Measurements Height: 6\' 1"  (1.854 m) Weight: (!) 315 lb (142.9 kg) BMI (Calculated): 41.57 Weight at Last Visit: 318lb Weight Lost Since Last Visit: 3lb Weight Gained Since Last Visit: 0lb Starting Weight: 326lb Total Weight Loss (lbs): 11 lb (4.99 kg)   Body Composition  Body Fat %: 34.9 % Fat Mass (lbs): 110.2 lbs Muscle Mass (lbs): 195.6 lbs Total Body Water (lbs): 143 lbs Visceral Fat Rating : 20   Other Clinical Data Fasting: No Labs: No Today's Visit #: 15 Starting Date: 06/27/22     HPI  Chief Complaint: OBESITY  Thomas [name] is here to discuss his progress with his obesity treatment plan. He is on the the Category 4 Plan and states he is following his eating plan approximately 30 % of the time. He states he is exercising 0 minutes 0 days per week.   Interval History:  Since last office visit he has lost 3 pounds.  He is not skipping meals and is eating a protein with each meal.  He is eating out more since his last visit due to his busy schedule.    Pharmacotherapy for weight loss: He is currently taking Wegovy 2.4 mg for medical weight loss (x 3 doses).  Denies side effects.  He has lost a total of 17 pounds since starting Wegovy on 08/06/23 (5% body weight lost).   Wegovy approved through 03/18/24    Previous pharmacotherapy for medical weight loss:  None   Bariatric surgery:  Patient has not had bariatric surgery  Obstructive Sleep Apnea Thomas [name] has a diagnosis of sleep apnea. He reports that he is using a CPAP regularly.      PHYSICAL EXAM:  Blood pressure (!) 145/85, pulse 78, temperature 98 F (36.7 C), height 6\' 1"  (1.854 m), weight (!) 315 lb (142.9 kg), SpO2 98%. Body mass index is 41.56 kg/m.  General:  He is overweight, cooperative, alert, well developed, and in no acute distress. PSYCH: Has normal mood, affect and thought process.   Extremities: No edema.  Neurologic: No gross sensory or motor deficits. No tremors or fasciculations noted.    DIAGNOSTIC DATA REVIEWED:  BMET    Component Value Date/Time   NA 141 08/06/2023 1153   K 4.2 08/06/2023 1153   CL 105 08/06/2023 1153   CO2 21 08/06/2023 1153   GLUCOSE 93 08/06/2023 1153   GLUCOSE 92 03/29/2022 1525   BUN 22 08/06/2023 1153   CREATININE 1.52 (H) 08/06/2023 1153   CREATININE 1.61 (H) 03/29/2022 1525   CALCIUM 9.7 08/06/2023 1153   GFRNONAA 61 07/28/2020 0944   GFRAA 70 07/28/2020 0944   Lab Results  Component Value Date   HGBA1C 5.9 (H) 08/06/2023   HGBA1C  12/04/2008    5.5 (NOTE)   The ADA recommends the following therapeutic goal for glycemic   control related to Hgb A1C measurement:   Goal of Therapy:   < 7.0% Hgb A1C   Reference: American Diabetes Association: Clinical Practice   Recommendations 2008, Diabetes Care,  2008, 31:(Suppl 1).   Lab Results  Component Value Date   INSULIN 27.5 (H) 08/06/2023   Lab Results  Component Value Date   TSH 1.240 08/06/2023   CBC    Component Value  Date/Time   WBC 3.7 08/27/2023 1232   WBC 3.5 (L) 03/29/2022 1525   RBC 4.66 08/27/2023 1232   RBC 4.85 03/29/2022 1525   HGB 13.7 08/27/2023 1232   HCT 42.2 08/27/2023 1232   PLT 155 08/27/2023 1232   MCV 91 08/27/2023 1232   MCH 29.4 08/27/2023 1232   MCH 28.9 03/29/2022 1525   MCHC 32.5 08/27/2023 1232   MCHC 32.6 03/29/2022 1525   RDW 14.2 08/27/2023 1232   Iron Studies No results found for: "IRON", "TIBC", "FERRITIN", "IRONPCTSAT" Lipid Panel     Component Value Date/Time   CHOL 120 08/06/2023 1153   TRIG 71 08/06/2023 1153   HDL 51 08/06/2023 1153   CHOLHDL 2.5 06/27/2022 1115   CHOLHDL 2 07/27/2021 0907   VLDL 11.0 07/27/2021 0907   LDLCALC 54 08/06/2023 1153   LDLCALC 82 04/09/2021 1417   Hepatic  Function Panel     Component Value Date/Time   PROT 7.2 08/06/2023 1153   ALBUMIN 4.5 08/06/2023 1153   AST 34 08/06/2023 1153   ALT 31 08/06/2023 1153   ALKPHOS 53 08/06/2023 1153   BILITOT 0.5 08/06/2023 1153   BILIDIR 0.2 07/27/2021 0907      Component Value Date/Time   TSH 1.240 08/06/2023 1153   Nutritional Lab Results  Component Value Date   VD25OH 27.3 (L) 08/06/2023   VD25OH 33.6 06/27/2022     ASSESSMENT AND PLAN  TREATMENT PLAN FOR OBESITY:  Recommended Dietary Goals  Thomas [name] is currently in the action stage of change. As such, his goal is to continue weight management plan. He has agreed to the Category 4 Plan.  Behavioral Intervention  We discussed the following Behavioral Modification Strategies today: increasing lean protein intake to established goals, decreasing simple carbohydrates , increasing vegetables, increasing fiber rich foods, increasing water intake , work on meal planning and preparation, reading food labels , keeping healthy foods at home, continue to work on implementation of reduced calorie nutritional plan, continue to practice mindfulness when eating, planning for success, and continue to work on maintaining a reduced calorie state, getting the recommended amount of protein, incorporating whole foods, making healthy choices, staying well hydrated and practicing mindfulness when eating..  Additional resources provided today: NA  Recommended Physical Activity Goals  Thomas [name] has been advised to work up to 150 minutes of moderate intensity aerobic activity a week and strengthening exercises 2-3 times per week for cardiovascular health, weight loss maintenance and preservation of muscle mass.   He has agreed to Think about enjoyable ways to increase daily physical activity and overcoming barriers to exercise, Increase physical activity in their day and reduce sedentary time (increase NEAT)., and Work on scheduling and tracking physical activity.     Pharmacotherapy We discussed various medication options to help Thomas [name] with his weight loss efforts and we both agreed to continue [facility] 2.4mg .  side effects discussed.  Avoid phentermine, Qsymia and Contrave due to hypertension, atrial fibrillation, CKD, CHF and history of CVA.   ASSOCIATED CONDITIONS ADDRESSED TODAY  Action/Plan  Vitamin D deficiency -     Vitamin D (Ergocalciferol); Take 1 capsule (50,000 Units total) by mouth every 7 (seven) days.  Dispense: 5 capsule; Refill: 0  OSA (obstructive sleep apnea) Continue CPAP nightly  Class 3 severe obesity due to excess calories with serious comorbidity and body mass index (BMI) of 40.0 to 44.9 in adult [facility]) -     ZOXWRU; Inject 2.4 mg into the skin once a week.  Dispense:  3 mL; Refill: 0         Return in about 4 weeks (around 04/12/2024).Thomas [name] He was informed of the importance of frequent follow up visits to maximize his success with intensive lifestyle modifications for his multiple health conditions.   ATTESTASTION STATEMENTS:  Reviewed by clinician on day of visit: allergies, medications, problem list, medical history, surgical history, family history, social history, and previous encounter notes.     Crist Dominion. Tatayana Beshears FNP-C

## 2024-03-16 NOTE — Telephone Encounter (Signed)
 Faxed over Prior authorization form to [facility] for patients Wegovy. Waiting on determination.

## 2024-03-17 ENCOUNTER — Other Ambulatory Visit: Payer: Self-pay | Admitting: Nurse Practitioner

## 2024-04-11 ENCOUNTER — Other Ambulatory Visit: Payer: Self-pay | Admitting: Nurse Practitioner

## 2024-04-11 DIAGNOSIS — E66813 Obesity, class 3: Secondary | ICD-10-CM

## 2024-04-13 ENCOUNTER — Encounter: Payer: Self-pay | Admitting: Nurse Practitioner

## 2024-04-13 ENCOUNTER — Other Ambulatory Visit: Payer: Self-pay | Admitting: Nurse Practitioner

## 2024-04-13 ENCOUNTER — Ambulatory Visit (INDEPENDENT_AMBULATORY_CARE_PROVIDER_SITE_OTHER): Admitting: Nurse Practitioner

## 2024-04-13 VITALS — BP 128/88 | HR 78 | Temp 98.8°F | Ht 73.0 in | Wt 318.0 lb

## 2024-04-13 DIAGNOSIS — E559 Vitamin D deficiency, unspecified: Secondary | ICD-10-CM

## 2024-04-13 DIAGNOSIS — E66813 Obesity, class 3: Secondary | ICD-10-CM

## 2024-04-13 DIAGNOSIS — Z6841 Body Mass Index (BMI) 40.0 and over, adult: Secondary | ICD-10-CM

## 2024-04-13 DIAGNOSIS — I10 Essential (primary) hypertension: Secondary | ICD-10-CM | POA: Diagnosis not present

## 2024-04-13 MED ORDER — VITAMIN D (ERGOCALCIFEROL) 1.25 MG (50000 UNIT) PO CAPS
50000.0000 [IU] | ORAL_CAPSULE | ORAL | 0 refills | Status: DC
Start: 2024-04-13 — End: 2024-07-26

## 2024-04-13 MED ORDER — WEGOVY 2.4 MG/0.75ML ~~LOC~~ SOAJ: 2.4000 mg | SUBCUTANEOUS | 0 refills | Status: DC

## 2024-04-13 NOTE — Progress Notes (Signed)
 Office: [phone]  /  Fax: [phone]  WEIGHT SUMMARY AND BIOMETRICS  Weight Lost Since Last Visit: 0lb  Weight Gained Since Last Visit: 3lb   Vitals Temp: 98.8 F (37.1 C) BP: 128/88 Pulse Rate: 78 SpO2: 99 %   Anthropometric Measurements Height: 6\' 1"  (1.854 m) Weight: (!) 318 lb (144.2 kg) BMI (Calculated): 41.96 Weight at Last Visit: 315lb Weight Lost Since Last Visit: 0lb Weight Gained Since Last Visit: 3lb Starting Weight: 326lb Total Weight Loss (lbs): 8 lb (3.629 kg)   Body Composition  Body Fat %: 36.8 % Fat Mass (lbs): 117 lbs Muscle Mass (lbs): 191.4 lbs Total Body Water (lbs): 147 lbs Visceral Fat Rating : 21   Other Clinical Data Fasting: No Labs: No Today's Visit #: 16 Starting Date: 06/27/22     HPI  Chief Complaint: OBESITY  Thomas [name] is here to discuss his progress with his obesity treatment plan. He is on the the Category 4 Plan and states he is following his eating plan approximately 40 % of the time. He states he is exercising 60 minutes 4 days per week.   Interval History:  Since last office visit he has gained 3 pounds.  Has gotten off track due to stress at home. He is snacking on belvitas.  He is drinking water and body armour.    Pharmacotherapy for weight loss: He is currently taking Wegovy  2.4 mg for medical weight loss.  Denies side effects.     Wegovy  approved through 03/16/25   Previous pharmacotherapy for medical weight loss:  None   Bariatric surgery:  Patient has not had bariatric surgery  Hypertension Hypertension looks better today.  Medication(s): Cozaar 100 mg, apresoline  100mg , lasix 20mg . Denies side effects.   Denies chest pain, palpitations and SOB.  BP Readings from Last 3 Encounters:  04/13/24 128/88  03/15/24 (!) 145/85  02/12/24 (!) 129/95   Lab Results  Component Value Date   CREATININE 1.52 (H) 08/06/2023   CREATININE 1.75 (H) 06/27/2022   CREATININE 1.61 (H) 03/29/2022     Vit D  deficiency  He is taking Vit D 50,000 IU weekly.  Denies side effects.  Denies nausea, vomiting or muscle weakness.    Lab Results  Component Value Date   VD25OH 27.3 (L) 08/06/2023   VD25OH 33.6 06/27/2022    PHYSICAL EXAM:  Blood pressure 128/88, pulse 78, temperature 98.8 F (37.1 C), height 6\' 1"  (1.854 m), weight (!) 318 lb (144.2 kg), SpO2 99%. Body mass index is 41.96 kg/m.  General: He is overweight, cooperative, alert, well developed, and in no acute distress. PSYCH: Has normal mood, affect and thought process.   Extremities: No edema.  Neurologic: No gross sensory or motor deficits. No tremors or fasciculations noted.    DIAGNOSTIC DATA REVIEWED:  BMET    Component Value Date/Time   NA 141 08/06/2023 1153   K 4.2 08/06/2023 1153   CL 105 08/06/2023 1153   CO2 21 08/06/2023 1153   GLUCOSE 93 08/06/2023 1153   GLUCOSE 92 03/29/2022 1525   BUN 22 08/06/2023 1153   CREATININE 1.52 (H) 08/06/2023 1153   CREATININE 1.61 (H) 03/29/2022 1525   CALCIUM  9.7 08/06/2023 1153   GFRNONAA 61 07/28/2020 0944   GFRAA 70 07/28/2020 0944   Lab Results  Component Value Date   HGBA1C 5.9 (H) 08/06/2023   HGBA1C  12/04/2008    5.5 (NOTE)   The ADA recommends the following therapeutic goal for glycemic   control related to  Hgb A1C measurement:   Goal of Therapy:   < 7.0% Hgb A1C   Reference: American Diabetes Association: Clinical Practice   Recommendations 2008, Diabetes Care,  2008, 31:(Suppl 1).   Lab Results  Component Value Date   INSULIN  27.5 (H) 08/06/2023   Lab Results  Component Value Date   TSH 1.240 08/06/2023   CBC    Component Value Date/Time   WBC 3.7 08/27/2023 1232   WBC 3.5 (L) 03/29/2022 1525   RBC 4.66 08/27/2023 1232   RBC 4.85 03/29/2022 1525   HGB 13.7 08/27/2023 1232   HCT 42.2 08/27/2023 1232   PLT 155 08/27/2023 1232   MCV 91 08/27/2023 1232   MCH 29.4 08/27/2023 1232   MCH 28.9 03/29/2022 1525   MCHC 32.5 08/27/2023 1232   MCHC 32.6  03/29/2022 1525   RDW 14.2 08/27/2023 1232   Iron Studies No results found for: "IRON", "TIBC", "FERRITIN", "IRONPCTSAT" Lipid Panel     Component Value Date/Time   CHOL 120 08/06/2023 1153   TRIG 71 08/06/2023 1153   HDL 51 08/06/2023 1153   CHOLHDL 2.5 06/27/2022 1115   CHOLHDL 2 07/27/2021 0907   VLDL 11.0 07/27/2021 0907   LDLCALC 54 08/06/2023 1153   LDLCALC 82 04/09/2021 1417   Hepatic Function Panel     Component Value Date/Time   PROT 7.2 08/06/2023 1153   ALBUMIN 4.5 08/06/2023 1153   AST 34 08/06/2023 1153   ALT 31 08/06/2023 1153   ALKPHOS 53 08/06/2023 1153   BILITOT 0.5 08/06/2023 1153   BILIDIR 0.2 07/27/2021 0907      Component Value Date/Time   TSH 1.240 08/06/2023 1153   Nutritional Lab Results  Component Value Date   VD25OH 27.3 (L) 08/06/2023   VD25OH 33.6 06/27/2022     ASSESSMENT AND PLAN  TREATMENT PLAN FOR OBESITY:  Recommended Dietary Goals  Thomas [name] is currently in the action stage of change. As such, his goal is to continue weight management plan. He has agreed to the Category 4 Plan.  Behavioral Intervention  We discussed the following Behavioral Modification Strategies today: increasing lean protein intake to established goals, decreasing simple carbohydrates , increasing vegetables, increasing fiber rich foods, increasing water intake , and continue to work on maintaining a reduced calorie state, getting the recommended amount of protein, incorporating whole foods, making healthy choices, staying well hydrated and practicing mindfulness when eating..  Additional resources provided today: NA  Recommended Physical Activity Goals  Thomas [name] has been advised to work up to 150 minutes of moderate intensity aerobic activity a week and strengthening exercises 2-3 times per week for cardiovascular health, weight loss maintenance and preservation of muscle mass.   He has agreed to Continue current level of physical activity , Think about  enjoyable ways to increase daily physical activity and overcoming barriers to exercise, Increase physical activity in their day and reduce sedentary time (increase NEAT)., and Work on scheduling and tracking physical activity.    Pharmacotherapy We discussed various medication options to help Thomas [name] with his weight loss efforts and we both agreed to continue Wegovy  2.4mg .  side effects discussed.  Avoid phentermine, Qsymia and Contrave due to hypertension, atrial fibrillation, CKD, CHF and history of CVA.   ASSOCIATED CONDITIONS ADDRESSED TODAY  Action/Plan  Essential hypertension Continue to follow up with PCP.  Continue meds as directed  Vitamin D  deficiency -     Vitamin D  (Ergocalciferol ); Take 1 capsule (50,000 Units total) by mouth every 7 (seven) days.  Dispense: 5 capsule; Refill: 0  Class 3 severe obesity due to excess calories with serious comorbidity and body mass index (BMI) of 40.0 to 44.9 in adult -     Wegovy ; Inject 2.4 mg into the skin once a week.  Dispense: 3 mL; Refill: 0         Return in about 4 weeks (around 05/11/2024).Aaron Aas He was informed of the importance of frequent follow up visits to maximize his success with intensive lifestyle modifications for his multiple health conditions.   ATTESTASTION STATEMENTS:  Reviewed by clinician on day of visit: allergies, medications, problem list, medical history, surgical history, family history, social history, and previous encounter notes.     Crist Dominion. Lateefa Crosby FNP-C

## 2024-04-28 ENCOUNTER — Encounter (HOSPITAL_BASED_OUTPATIENT_CLINIC_OR_DEPARTMENT_OTHER): Payer: Self-pay

## 2024-04-28 ENCOUNTER — Emergency Department (HOSPITAL_BASED_OUTPATIENT_CLINIC_OR_DEPARTMENT_OTHER)
Admission: EM | Admit: 2024-04-28 | Discharge: 2024-04-28 | Disposition: A | Discharge status: Home / Self Care | Attending: Emergency Medicine | Admitting: Emergency Medicine

## 2024-04-28 ENCOUNTER — Other Ambulatory Visit: Payer: Self-pay

## 2024-04-28 DIAGNOSIS — Z79899 Other long term (current) drug therapy: Secondary | ICD-10-CM | POA: Diagnosis not present

## 2024-04-28 DIAGNOSIS — H5712 Ocular pain, left eye: Secondary | ICD-10-CM | POA: Diagnosis present

## 2024-04-28 DIAGNOSIS — I10 Essential (primary) hypertension: Secondary | ICD-10-CM | POA: Diagnosis not present

## 2024-04-28 DIAGNOSIS — B309 Viral conjunctivitis, unspecified: Secondary | ICD-10-CM | POA: Diagnosis not present

## 2024-04-28 NOTE — ED Triage Notes (Addendum)
 Pt states that he has sore throat and left eye irritation. Started having these symptoms along with body soreness on Sunday. Denies any other symptoms.   Pt hypertensive in triage. Pt states that he took his bp medications this am.   Anastacio Balm, RN

## 2024-04-28 NOTE — Discharge Instructions (Addendum)
 You were seen in the emergency room for left eye irritation This is most likely caused by a virus that you picked up from your son in daycare Your symptoms should be improved in the next 3 to 4 days and do not to require antibiotics at this time If your symptoms do not improve or worsen after that time come back to the ED we will start you on some antibiotics for conjunctivitis because by bacteria Follow-up with your primary care doctor in a week for reevaluation

## 2024-04-28 NOTE — ED Provider Notes (Signed)
 [facility] AT [facility] Provider Note   CSN: [id number] Arrival date & time: 04/28/24  2009     History  Chief Complaint  Patient presents with   Sore Throat    Thomas [name]. is a 43 y.o. male.  Who presents to the ED for left eye discomfort.  5 days of viral symptoms including rhinorrhea sore throat left eye itching watery discharge.  Rhinorrhea and sore throat is mostly resolved.  Still having some itching and discomfort in the left eye.  No significant purulent discharge.  Vision remains normal.  His son recently started daycare and is getting over viral symptoms as well.  No chest pain shortness of breath nausea vomiting or diarrhea   Sore Throat       Home Medications Prior to Admission medications   Medication Sig Start Date End Date Taking? Authorizing Provider  allopurinol  (ZYLOPRIM ) 300 MG tablet TAKE ONE-HALF TABLET BY MOUTH DAILY FOR GOUT 03/30/22   [provider]  busPIRone (BUSPAR) 10 MG tablet Take 10 mg by mouth 3 (three) times daily.    [provider]  carboxymethylcellulose (REFRESH PLUS) 0.5 % SOLN Place 1 drop into both eyes daily as needed. 05/02/22   [provider]  cloNIDine  (CATAPRES  - DOSED IN MG/24 HR) 0.3 mg/24hr patch PLACE 1 PATCH (0.3 MG TOTAL) ONTO THE SKIN ONCE A WEEK. 11/03/23   Eilleen Grates, MD  diltiazem  (TIAZAC ) 360 MG 24 hr capsule Take 360 mg by mouth daily. 08/01/22   [provider]  eplerenone  (INSPRA ) 25 MG tablet Take 1 tablet (25 mg total) by mouth daily. 04/14/23   Eilleen Grates, MD  furosemide (LASIX) 20 MG tablet Take 20 mg by mouth daily. 07/29/23   [provider]  hydrALAZINE  (APRESOLINE ) 100 MG tablet Take 1 tablet (100 mg total) by mouth 3 (three) times daily. 03/29/22   Kuneff, Renee A, DO  labetalol  (NORMODYNE ) 200 MG tablet Take 1 tablet (200 mg total) by mouth 3 (three) times daily. 04/04/22   Eilleen Grates, MD  latanoprost (XALATAN) 0.005 %  ophthalmic solution SMARTSIG:In Eye(s) 08/15/22   [provider]  losartan (COZAAR) 100 MG tablet Take 100 mg by mouth daily. 07/11/22   [provider]  rivaroxaban  (XARELTO ) 20 MG TABS tablet Take 1 tablet (20 mg total) by mouth daily with supper. 03/29/22   Kuneff, Renee A, DO  rosuvastatin  (CRESTOR ) 10 MG tablet TAKE 1 TABLET BY MOUTH IN THE EVENING ON MONDAY, WEDNESDAY AND FRIDAY AS DIRECTED 07/31/22   Kuneff, Renee A, DO  Semaglutide -Weight Management (WEGOVY ) 2.4 MG/0.75ML SOAJ Inject 2.4 mg into the skin once a week. 04/13/24   Helane Lloyd, FNP  Vitamin D , Ergocalciferol , (DRISDOL ) 1.25 MG (50000 UNIT) CAPS capsule Take 1 capsule (50,000 Units total) by mouth every 7 (seven) days. 04/13/24   Helane Lloyd, FNP      Allergies    Benicar  hct [olmesartan  medoxomil-hctz], Lisinopril, and Spironolactone     Review of Systems   Review of Systems  Physical Exam Updated Vital Signs BP (!) 180/131 (BP Location: Right Arm)   Pulse 74   Temp 97.7 F (36.5 C)   Resp 18   Ht 6\' 1"  (1.854 m)   Wt (!) 142.9 kg   SpO2 98%   BMI 41.56 kg/m  Physical Exam Vitals and nursing note reviewed.  HENT:     Head: Normocephalic and atraumatic.     Comments: Conjunctival injection of the left eye Vision grossly  normal Scant watery discharge    Nose: Rhinorrhea present.     Mouth/Throat:     Pharynx: No oropharyngeal exudate or posterior oropharyngeal erythema.  Eyes:     Pupils: Pupils are equal, round, and reactive to light.  Cardiovascular:     Rate and Rhythm: Normal rate and regular rhythm.  Pulmonary:     Effort: Pulmonary effort is normal.     Breath sounds: Normal breath sounds.  Abdominal:     Palpations: Abdomen is soft.     Tenderness: There is no abdominal tenderness.  Skin:    General: Skin is warm and dry.  Neurological:     Mental Status: He is alert.  Psychiatric:        Mood and Affect: Mood normal.     ED Results / Procedures /  Treatments   Labs (all labs ordered are listed, but only abnormal results are displayed) Labs Reviewed - No data to display  EKG None  Radiology No results found.  Procedures Procedures    Medications Ordered in ED Medications - No data to display  ED Course/ Medical Decision Making/ A&P                                 Medical Decision Making 43 year old male with history as above presenting for viral syndrome of rhinorrhea sore throat left eye conjunctivitis.  Well-appearing overall.  Afebrile.  Hypertensive but has not taken his usual nighttime antihypertensive regimen yet.  No chest pain or shortness of breath.  Left eye consistent with conjunctivitis.  Suspect this is most likely viral in etiology not requiring antibiotics.  Counseled him on symptomatic management at home and he will return if his symptoms do not improve over the next few days           Final Clinical Impression(s) / ED Diagnoses Final diagnoses:  Viral conjunctivitis of left eye    Rx / DC Orders ED Discharge Orders     None         Sallyanne Creamer, DO 04/28/24 2131

## 2024-04-28 NOTE — ED Notes (Signed)

## 2024-05-12 ENCOUNTER — Encounter: Payer: Self-pay | Admitting: Nurse Practitioner

## 2024-05-12 ENCOUNTER — Ambulatory Visit (INDEPENDENT_AMBULATORY_CARE_PROVIDER_SITE_OTHER): Admitting: Nurse Practitioner

## 2024-05-12 VITALS — BP 172/89 | HR 79 | Temp 98.2°F | Ht 73.0 in | Wt 317.0 lb

## 2024-05-12 DIAGNOSIS — E66813 Obesity, class 3: Secondary | ICD-10-CM

## 2024-05-12 DIAGNOSIS — G4733 Obstructive sleep apnea (adult) (pediatric): Secondary | ICD-10-CM

## 2024-05-12 DIAGNOSIS — I10 Essential (primary) hypertension: Secondary | ICD-10-CM

## 2024-05-12 DIAGNOSIS — Z6841 Body Mass Index (BMI) 40.0 and over, adult: Secondary | ICD-10-CM | POA: Diagnosis not present

## 2024-05-12 MED ORDER — ZEPBOUND 5 MG/0.5ML ~~LOC~~ SOAJ: 5.0000 mg | SUBCUTANEOUS | 0 refills | Status: DC

## 2024-05-12 NOTE — Progress Notes (Signed)
 Office: [phone]  /  Fax: [phone]  WEIGHT SUMMARY AND BIOMETRICS  Weight Lost Since Last Visit: 1lb  Weight Gained Since Last Visit: 0lb   Vitals Temp: 98.2 F (36.8 C) BP: (!) 172/89 Pulse Rate: 79 SpO2: 97 %   Anthropometric Measurements Height: 6' 1 (1.854 m) Weight: (!) 317 lb (143.8 kg) BMI (Calculated): 41.83 Weight at Last Visit: 318lb Weight Lost Since Last Visit: 1lb Weight Gained Since Last Visit: 0lb Starting Weight: 326lb Total Weight Loss (lbs): 9 lb (4.082 kg)   Body Composition  Body Fat %: 35.7 % Fat Mass (lbs): 113.4 lbs Muscle Mass (lbs): 194.4 lbs Total Body Water (lbs): 142.2 lbs Visceral Fat Rating : 21   Other Clinical Data Fasting: No Labs: No Today's Visit #: 17 Starting Date: 06/27/22     HPI  Chief Complaint: OBESITY  Thomas [name] is here to discuss his progress with his obesity treatment plan. He is on the the Category 4 Plan and states he is following his eating plan approximately 50 % of the time. He states he is exercising 0 minutes 0 days per week.   Interval History:  Since last office visit he has lost 1 pound. He went on vacation since his last visit.  He feels things here are going a lot better.  He is grilling more at home.  He is aiming to eat more protein.  He is drinking water with flavoring and liquid IV  Pharmacotherapy for weight loss: He is currently taking Wegovy  2.4 mg for medical weight loss.  Denies side effects.    He has lost a total of 15 lbs since starting Wegovy  on 08/06/23   Wegovy  approved through 03/16/25   Previous pharmacotherapy for medical weight loss:  None   Bariatric surgery:  Patient has not had bariatric surgery  Hypertension Hypertension poorly controlled. Has appt with PCP next week and currently doesn't have a follow up appt with cardiology. Saw cardiology last on 10/15/23. Medication(s): Cozaar 100 mg, apresoline  100mg , lasix 20mg . Denies side effects.   Denies chest pain,  palpitations and SOB.  BP Readings from Last 3 Encounters:  05/12/24 (!) 172/89  04/28/24 (!) 180/131  04/13/24 128/88   Lab Results  Component Value Date   CREATININE 1.52 (H) 08/06/2023   CREATININE 1.75 (H) 06/27/2022   CREATININE 1.61 (H) 03/29/2022    Obstructive Sleep Apnea Keontay has a diagnosis of sleep apnea. He reports that he is using a CPAP regularly.  Waiting for new CPAP supplies.  He's walking up multiple at nights due to his mask leaking.      PHYSICAL EXAM:  Blood pressure (!) 172/89, pulse 79, temperature 98.2 F (36.8 C), height 6' 1 (1.854 m), weight (!) 317 lb (143.8 kg), SpO2 97%. Body mass index is 41.82 kg/m.  General: He is overweight, cooperative, alert, well developed, and in no acute distress. PSYCH: Has normal mood, affect and thought process.   Extremities: No edema.  Neurologic: No gross sensory or motor deficits. No tremors or fasciculations noted.    DIAGNOSTIC DATA REVIEWED:  BMET    Component Value Date/Time   NA 141 08/06/2023 1153   K 4.2 08/06/2023 1153   CL 105 08/06/2023 1153   CO2 21 08/06/2023 1153   GLUCOSE 93 08/06/2023 1153   GLUCOSE 92 03/29/2022 1525   BUN 22 08/06/2023 1153   CREATININE 1.52 (H) 08/06/2023 1153   CREATININE 1.61 (H) 03/29/2022 1525   CALCIUM  9.7 08/06/2023 1153   GFRNONAA 61  07/28/2020 0944   GFRAA 70 07/28/2020 0944   Lab Results  Component Value Date   HGBA1C 5.9 (H) 08/06/2023   HGBA1C  12/04/2008    5.5 (NOTE)   The ADA recommends the following therapeutic goal for glycemic   control related to Hgb A1C measurement:   Goal of Therapy:   < 7.0% Hgb A1C   Reference: American Diabetes Association: Clinical Practice   Recommendations 2008, Diabetes Care,  2008, 31:(Suppl 1).   Lab Results  Component Value Date   INSULIN  27.5 (H) 08/06/2023   Lab Results  Component Value Date   TSH 1.240 08/06/2023   CBC    Component Value Date/Time   WBC 3.7 08/27/2023 1232   WBC 3.5 (L) 03/29/2022 1525    RBC 4.66 08/27/2023 1232   RBC 4.85 03/29/2022 1525   HGB 13.7 08/27/2023 1232   HCT 42.2 08/27/2023 1232   PLT 155 08/27/2023 1232   MCV 91 08/27/2023 1232   MCH 29.4 08/27/2023 1232   MCH 28.9 03/29/2022 1525   MCHC 32.5 08/27/2023 1232   MCHC 32.6 03/29/2022 1525   RDW 14.2 08/27/2023 1232   Iron Studies No results found for: IRON, TIBC, FERRITIN, IRONPCTSAT Lipid Panel     Component Value Date/Time   CHOL 120 08/06/2023 1153   TRIG 71 08/06/2023 1153   HDL 51 08/06/2023 1153   CHOLHDL 2.5 06/27/2022 1115   CHOLHDL 2 07/27/2021 0907   VLDL 11.0 07/27/2021 0907   LDLCALC 54 08/06/2023 1153   LDLCALC 82 04/09/2021 1417   Hepatic Function Panel     Component Value Date/Time   PROT 7.2 08/06/2023 1153   ALBUMIN 4.5 08/06/2023 1153   AST 34 08/06/2023 1153   ALT 31 08/06/2023 1153   ALKPHOS 53 08/06/2023 1153   BILITOT 0.5 08/06/2023 1153   BILIDIR 0.2 07/27/2021 0907      Component Value Date/Time   TSH 1.240 08/06/2023 1153   Nutritional Lab Results  Component Value Date   VD25OH 27.3 (L) 08/06/2023   VD25OH 33.6 06/27/2022     ASSESSMENT AND PLAN  TREATMENT PLAN FOR OBESITY:  Recommended Dietary Goals  Seaborn is currently in the action stage of change. As such, his goal is to continue weight management plan. He has agreed to to track and will review at next visit.  Behavioral Intervention  We discussed the following Behavioral Modification Strategies today: increasing lean protein intake to established goals, decreasing simple carbohydrates , increasing vegetables, increasing fiber rich foods, increasing water intake , and continue to work on maintaining a reduced calorie state, getting the recommended amount of protein, incorporating whole foods, making healthy choices, staying well hydrated and practicing mindfulness when eating..  Additional resources provided today: NA  Recommended Physical Activity Goals  Westlee has been advised to work  up to 150 minutes of moderate intensity aerobic activity a week and strengthening exercises 2-3 times per week for cardiovascular health, weight loss maintenance and preservation of muscle mass.   He has agreed to Think about enjoyable ways to increase daily physical activity and overcoming barriers to exercise, Increase physical activity in their day and reduce sedentary time (increase NEAT)., and Work on scheduling and tracking physical activity.    Pharmacotherapy We discussed various medication options to help Elizar with his weight loss efforts and we both agreed to stop Wegovy  and start Zepbound 5mg . Side effects discussed.  Avoid phentermine, Qsymia and Contrave due to hypertension, atrial fibrillation, CKD, CHF and history of CVA.  ASSOCIATED CONDITIONS ADDRESSED TODAY  Action/Plan  Essential hypertension Needs to make appt with cardiology and see PCP back for follow up  OSA (obstructive sleep apnea) Continue CPAP nightly.  Waiting for CPAP supplies.   Class 3 severe obesity due to excess calories with serious comorbidity and body mass index (BMI) of 40.0 to 44.9 in adult -     Zepbound; Inject 5 mg into the skin once a week.  Dispense: 2 mL; Refill: 0         Return in about 4 weeks (around 06/09/2024).Aaron Aas He was informed of the importance of frequent follow up visits to maximize his success with intensive lifestyle modifications for his multiple health conditions.   ATTESTASTION STATEMENTS:  Reviewed by clinician on day of visit: allergies, medications, problem list, medical history, surgical history, family history, social history, and previous encounter notes.     Crist Dominion. Jerusalen Mateja FNP-C

## 2024-05-13 ENCOUNTER — Telehealth (INDEPENDENT_AMBULATORY_CARE_PROVIDER_SITE_OTHER): Payer: Self-pay | Admitting: *Deleted

## 2024-05-13 NOTE — Telephone Encounter (Signed)
 PA SUBMITTED VIA [employer] FOR ZEPBOUND.   Thomas [name] (Key: [id number])  This request has received an approval. View the bottom of the request for an electronic copy of the approval letter.    PATIENT NOTIFIED VIA MYCHART MESSAGE.

## 2024-06-12 ENCOUNTER — Emergency Department (HOSPITAL_BASED_OUTPATIENT_CLINIC_OR_DEPARTMENT_OTHER)
Admission: EM | Admit: 2024-06-12 | Discharge: 2024-06-12 | Disposition: A | Discharge status: Home / Self Care | Attending: Emergency Medicine | Admitting: Emergency Medicine

## 2024-06-12 ENCOUNTER — Emergency Department (HOSPITAL_BASED_OUTPATIENT_CLINIC_OR_DEPARTMENT_OTHER)

## 2024-06-12 ENCOUNTER — Encounter (HOSPITAL_BASED_OUTPATIENT_CLINIC_OR_DEPARTMENT_OTHER): Payer: Self-pay

## 2024-06-12 DIAGNOSIS — Z79899 Other long term (current) drug therapy: Secondary | ICD-10-CM | POA: Insufficient documentation

## 2024-06-12 DIAGNOSIS — I4811 Longstanding persistent atrial fibrillation: Secondary | ICD-10-CM | POA: Insufficient documentation

## 2024-06-12 DIAGNOSIS — I10 Essential (primary) hypertension: Secondary | ICD-10-CM | POA: Insufficient documentation

## 2024-06-12 DIAGNOSIS — R519 Headache, unspecified: Secondary | ICD-10-CM | POA: Diagnosis present

## 2024-06-12 LAB — PRO BRAIN NATRIURETIC PEPTIDE: Pro Brain Natriuretic Peptide: 225 pg/mL (ref <300.0)

## 2024-06-12 LAB — CBC WITH DIFFERENTIAL/PLATELET
Abs Immature Granulocytes: 0.01 K/uL (ref 0.00–0.07)
Basophils Absolute: 0 K/uL (ref 0.0–0.1)
Basophils Relative: 1 %
Eosinophils Absolute: 0.1 K/uL (ref 0.0–0.5)
Eosinophils Relative: 2 %
HCT: 41.3 % (ref 39.0–52.0)
Hemoglobin: 13.8 g/dL (ref 13.0–17.0)
Immature Granulocytes: 0 %
Lymphocytes Relative: 32 %
Lymphs Abs: 1.3 K/uL (ref 0.7–4.0)
MCH: 29.2 pg (ref 26.0–34.0)
MCHC: 33.4 g/dL (ref 30.0–36.0)
MCV: 87.5 fL (ref 80.0–100.0)
Monocytes Absolute: 0.4 K/uL (ref 0.1–1.0)
Monocytes Relative: 10 %
Neutro Abs: 2.2 K/uL (ref 1.7–7.7)
Neutrophils Relative %: 55 %
Platelets: 160 K/uL (ref 150–400)
RBC: 4.72 MIL/uL (ref 4.22–5.81)
RDW: 14.2 % (ref 11.5–15.5)
WBC: 4 K/uL (ref 4.0–10.5)
nRBC: 0 % (ref 0.0–0.2)

## 2024-06-12 LAB — COMPREHENSIVE METABOLIC PANEL WITH GFR
ALT: 39 U/L (ref 0–44)
AST: 45 U/L — ABNORMAL HIGH (ref 15–41)
Albumin: 4.3 g/dL (ref 3.5–5.0)
Alkaline Phosphatase: 50 U/L (ref 38–126)
Anion gap: 11 (ref 5–15)
BUN: 20 mg/dL (ref 6–20)
CO2: 25 mmol/L (ref 22–32)
Calcium: 9.4 mg/dL (ref 8.9–10.3)
Chloride: 103 mmol/L (ref 98–111)
Creatinine, Ser: 1.43 mg/dL — ABNORMAL HIGH (ref 0.61–1.24)
GFR, Estimated: >60 mL/min (ref >60)
Glucose, Bld: 88 mg/dL (ref 70–99)
Potassium: 3.9 mmol/L (ref 3.5–5.1)
Sodium: 139 mmol/L (ref 135–145)
Total Bilirubin: 0.5 mg/dL (ref 0.0–1.2)
Total Protein: 7.5 g/dL (ref 6.5–8.1)

## 2024-06-12 LAB — TROPONIN T, HIGH SENSITIVITY
Troponin T High Sensitivity: <15 ng/L (ref <19)
Troponin T High Sensitivity: <15 ng/L (ref <19)

## 2024-06-12 MED ORDER — IOHEXOL 350 MG/ML SOLN
100.0000 mL | Freq: Once | INTRAVENOUS | Status: AC | PRN
Start: 2024-06-12 — End: 2024-06-12
  Administered 2024-06-12: 75 mL via INTRAVENOUS

## 2024-06-12 MED ORDER — ACETAMINOPHEN 500 MG PO TABS
1000.0000 mg | ORAL_TABLET | Freq: Once | ORAL | Status: AC
  Administered 2024-06-12: 1000 mg via ORAL
  Filled 2024-06-12: qty 2

## 2024-06-12 NOTE — ED Provider Notes (Signed)
 [facility] Provider Note   CSN: [id number] Arrival date & time: 06/12/24  1035     Patient presents with: Hypertension   Thomas [name]. is a 43 y.o. male.   Patient presents with elevated blood pressure this morning 180 systolic.  Patient took his medication 2 hours prior to arrival.  Patient is only missed 1 evening of blood pressure meds in the past week.  Patient follows with cardiology and primary doctor for this.  Patient denies any stroke symptoms however has history of stroke.  No fevers or chills chest pain or shortness of breath.  Mild posterior headache gradual onset for almost 4 days.  The history is provided by the patient.  Hypertension       Prior to Admission medications   Medication Sig Start Date End Date Taking? Authorizing Provider  allopurinol  (ZYLOPRIM ) 300 MG tablet TAKE ONE-HALF TABLET BY MOUTH DAILY FOR GOUT 03/30/22   [provider]  busPIRone (BUSPAR) 10 MG tablet Take 10 mg by mouth 3 (three) times daily.    [provider]  carboxymethylcellulose (REFRESH PLUS) 0.5 % SOLN Place 1 drop into both eyes daily as needed. 05/02/22   [provider]  cloNIDine  (CATAPRES  - DOSED IN MG/24 HR) 0.3 mg/24hr patch PLACE 1 PATCH (0.3 MG TOTAL) ONTO THE SKIN ONCE A WEEK. 11/03/23   Lavona Agent, MD  diltiazem  (TIAZAC ) 360 MG 24 hr capsule Take 360 mg by mouth daily. 08/01/22   [provider]  eplerenone  (INSPRA ) 25 MG tablet Take 1 tablet (25 mg total) by mouth daily. 04/14/23   Lavona Agent, MD  furosemide (LASIX) 20 MG tablet Take 20 mg by mouth daily. 07/29/23   [provider]  hydrALAZINE  (APRESOLINE ) 100 MG tablet Take 1 tablet (100 mg total) by mouth 3 (three) times daily. 03/29/22   Kuneff, Renee A, DO  labetalol  (NORMODYNE ) 200 MG tablet Take 1 tablet (200 mg total) by mouth 3 (three) times daily. 04/04/22   Lavona Agent, MD  latanoprost (XALATAN) 0.005 % ophthalmic  solution SMARTSIG:In Eye(s) 08/15/22   [provider]  losartan (COZAAR) 100 MG tablet Take 100 mg by mouth daily. 07/11/22   [provider]  rivaroxaban  (XARELTO ) 20 MG TABS tablet Take 1 tablet (20 mg total) by mouth daily with supper. 03/29/22   Kuneff, Renee A, DO  rosuvastatin  (CRESTOR ) 10 MG tablet TAKE 1 TABLET BY MOUTH IN THE EVENING ON MONDAY, WEDNESDAY AND FRIDAY AS DIRECTED 07/31/22   Kuneff, Renee A, DO  tirzepatide  (ZEPBOUND ) 5 MG/0.5ML Pen Inject 5 mg into the skin once a week. 05/12/24   Becki Krabbe, FNP  Vitamin D , Ergocalciferol , (DRISDOL ) 1.25 MG (50000 UNIT) CAPS capsule Take 1 capsule (50,000 Units total) by mouth every 7 (seven) days. 04/13/24   Becki Krabbe, FNP    Allergies: Benicar  hct [olmesartan  medoxomil-hctz], Lisinopril, and Spironolactone     Review of Systems  Updated Vital Signs BP (!) 133/90   Pulse (!) 51   Temp 98.2 F (36.8 C) (Oral)   Resp (!) 23   Ht 6' 1 (1.854 m)   Wt (!) 143.8 kg   SpO2 97%   BMI 41.82 kg/m   Physical Exam Vitals and nursing note reviewed.  Constitutional:      General: He is not in acute distress.    Appearance: He is well-developed.  HENT:     Head: Normocephalic and atraumatic.     Mouth/Throat:  Mouth: Mucous membranes are moist.  Eyes:     General:        Right eye: No discharge.        Left eye: No discharge.     Conjunctiva/sclera: Conjunctivae normal.  Neck:     Trachea: No tracheal deviation.  Cardiovascular:     Rate and Rhythm: Regular rhythm. Bradycardia present.     Heart sounds: No murmur heard. Pulmonary:     Effort: Pulmonary effort is normal.     Breath sounds: Normal breath sounds.  Abdominal:     General: There is no distension.     Palpations: Abdomen is soft.     Tenderness: There is no abdominal tenderness. There is no guarding.  Musculoskeletal:        General: No swelling.     Cervical back: Normal range of motion and neck supple. No rigidity.   Skin:    General: Skin is warm.     Capillary Refill: Capillary refill takes less than 2 seconds.     Findings: No rash.  Neurological:     General: No focal deficit present.     Mental Status: He is alert.     Cranial Nerves: No cranial nerve deficit.     Sensory: No sensory deficit.     Motor: No weakness.     Coordination: Coordination normal.  Psychiatric:        Mood and Affect: Mood normal.     (all labs ordered are listed, but only abnormal results are displayed) Labs Reviewed  COMPREHENSIVE METABOLIC PANEL WITH GFR - Abnormal; Notable for the following components:      Result Value   Creatinine, Ser 1.43 (*)    AST 45 (*)    All other components within normal limits  CBC WITH DIFFERENTIAL/PLATELET  PRO BRAIN NATRIURETIC PEPTIDE  TROPONIN T, HIGH SENSITIVITY  TROPONIN T, HIGH SENSITIVITY    EKG: EKG Interpretation Date/Time:  Saturday June 12 2024 10:51:17 EDT Ventricular Rate:  75 PR Interval:    QRS Duration:  114 QT Interval:  419 QTC Calculation: 468 R Axis:   -13  Text Interpretation: Atrial fibrillation Borderline intraventricular conduction delay Borderline T wave abnormalities Confirmed by Tonia Chew [phone]) on 06/12/2024 11:19:21 AM  Radiology: CT ANGIO HEAD NECK W WO CM Result Date: 06/12/2024 CLINICAL DATA:  posterior ha and htn, stroke hx EXAM: CT ANGIOGRAPHY HEAD AND NECK WITH AND WITHOUT CONTRAST TECHNIQUE: Multidetector CT imaging of the head and neck was performed using the standard protocol during bolus administration of intravenous contrast. Multiplanar CT image reconstructions and MIPs were obtained to evaluate the vascular anatomy. Carotid stenosis measurements (when applicable) are obtained utilizing NASCET criteria, using the distal internal carotid diameter as the denominator. RADIATION DOSE REDUCTION: This exam was performed according to the departmental dose-optimization program which includes automated exposure control, adjustment of  the mA and/or kV according to patient size and/or use of iterative reconstruction technique. CONTRAST:  75mL OMNIPAQUE  IOHEXOL  350 MG/ML SOLN COMPARISON:  CT of the head dated June 12, 2024. FINDINGS: CT HEAD FINDINGS Brain: Encephalomalacia of the right parietal lobe. No evidence of hemorrhage, mass, acute cortical infarct or hydrocephalus. Vascular: Calcific plaque within the carotid siphons. Skull: Intact. Sinuses/Orbits: No acute process. Other: None. Review of the MIP images confirms the above findings CTA NECK FINDINGS Aortic arch: Normal. Right carotid system: The common carotid and internal carotid arteries are normal in caliber and unremarkable throughout the neck. Left carotid system: The common carotid and  internal carotid arteries are normal in caliber and unremarkable throughout the neck. Vertebral arteries: Codominant and widely patent. Skeleton: Negative. Other neck: Negative. Upper chest: Negative. Review of the MIP images confirms the above findings CTA HEAD FINDINGS Anterior circulation: The internal carotid arteries and anterior and middle cerebral arteries and their branches are normal in caliber. No evidence of large vessel occlusion, flow-limiting stenosis or aneurysm. There is a complete circle-of-Willis. Posterior circulation: Vertebrobasilar system appears normal. The posterior cerebral arteries and cerebellar arteries are normal in caliber bilaterally. Venous sinuses: Patent and unremarkable. Anatomic variants: None. Review of the MIP images confirms the above findings IMPRESSION: 1. Normal CT angiogram of the head and neck. 2. Right parietal encephalomalacia. Electronically Signed   By: Evalene Coho M.D.   On: 06/12/2024 13:52   CT Head Wo Contrast Result Date: 06/12/2024 CLINICAL DATA:  Severe headache for 4 days.  Hypertension. EXAM: CT HEAD WITHOUT CONTRAST TECHNIQUE: Contiguous axial images were obtained from the base of the skull through the vertex without intravenous  contrast. RADIATION DOSE REDUCTION: This exam was performed according to the departmental dose-optimization program which includes automated exposure control, adjustment of the mA and/or kV according to patient size and/or use of iterative reconstruction technique. COMPARISON:  12/07/2012 FINDINGS: Brain: No evidence of intracranial hemorrhage, acute infarction, hydrocephalus, extra-axial collection, or mass lesion/mass effect. 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 Vascular:  No hyperdense vessel or other acute findings. Skull: No evidence of fracture or other significant bone abnormality. Sinuses/Orbits:  No acute findings. Other: None. IMPRESSION: No acute intracranial abnormality. 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 Electronically Signed   By: Norleen DELENA Kil M.D.   On: 06/12/2024 12:04     Procedures   Medications Ordered in the ED  acetaminophen  (TYLENOL ) tablet 1,000 mg (1,000 mg Oral Given 06/12/24 1207)  iohexol  (OMNIPAQUE ) 350 MG/ML injection 100 mL (75 mLs Intravenous Contrast Given 06/12/24 1321)                                    Medical Decision Making Amount and/or Complexity of Data Reviewed Labs: ordered. Radiology: ordered.  Risk OTC drugs. Prescription drug management.   Patient with known high blood pressure and stroke history presents with uncontrolled blood pressure and posterior headache which is gradual onset.  Differential includes tension, other types of headaches, hypertensive hemorrhage, metabolic, other.  Blood work independent reviewed creatinine mild elevated 1.4 similar to previous, electrolytes unremarkable.  Blood pressure elevated 154 systolic in the ER.  Patient has an appointment on Tuesday to review and with primary doctor.  CT scan of the head pending.  Tylenol  ordered for pain.  On reassessment patient still having significant posterior head pain.  Initial CT scan reviewed results no acute findings no  hemorrhage.  CT angiogram results independent reviewed and fortunately also no concerns.  Patient's blood pressure gradually improved in the ED likely his oral medications working and headache improved.  Patient stable for outpatient follow-up.     Final diagnoses:  Longstanding persistent atrial fibrillation [facility])  Primary hypertension    ED Discharge Orders     None          Tonia Chew, MD 06/12/24 1444

## 2024-06-12 NOTE — Discharge Instructions (Addendum)
 Your CT scan of your brain and neck looked okay. Stay well hydrated and follow-up with your primary doctor as previously arranged for blood pressure management.  Return for chest pain stroke symptoms or new concerns.

## 2024-06-12 NOTE — ED Notes (Signed)
 Reviewed discharge instructions and follow up with pt. HA improved. Pt states understanding. Ambulatory at discharge

## 2024-06-12 NOTE — ED Triage Notes (Addendum)
 C/o headache x 4 days, checked BP today and was 180 systolic. Takes antihypertensives, hasn't missed a dose.  Hx of CVA, left sided deficit

## 2024-06-29 ENCOUNTER — Encounter: Payer: Self-pay | Admitting: Nurse Practitioner

## 2024-06-29 ENCOUNTER — Ambulatory Visit (INDEPENDENT_AMBULATORY_CARE_PROVIDER_SITE_OTHER): Admitting: Nurse Practitioner

## 2024-06-29 VITALS — BP 139/86 | HR 80 | Temp 98.2°F | Ht 73.0 in | Wt 319.0 lb

## 2024-06-29 DIAGNOSIS — I10 Essential (primary) hypertension: Secondary | ICD-10-CM | POA: Diagnosis not present

## 2024-06-29 DIAGNOSIS — E66813 Obesity, class 3: Secondary | ICD-10-CM

## 2024-06-29 DIAGNOSIS — Z6841 Body Mass Index (BMI) 40.0 and over, adult: Secondary | ICD-10-CM | POA: Diagnosis not present

## 2024-06-29 MED ORDER — ZEPBOUND 7.5 MG/0.5ML ~~LOC~~ SOAJ: 7.5000 mg | SUBCUTANEOUS | 0 refills | Status: DC

## 2024-06-29 NOTE — Progress Notes (Signed)
 Office: [phone]  /  Fax: [phone]  WEIGHT SUMMARY AND BIOMETRICS  Weight Lost Since Last Visit: 0lb  Weight Gained Since Last Visit: 2lb   Vitals Temp: 98.2 F (36.8 C) BP: 139/86 Pulse Rate: 80 SpO2: 98 %   Anthropometric Measurements Height: 6' 1 (1.854 m) Weight: (!) 319 lb (144.7 kg) BMI (Calculated): 42.1 Weight at Last Visit: 317lb Weight Lost Since Last Visit: 0lb Weight Gained Since Last Visit: 2lb Starting Weight: 326lb Total Weight Loss (lbs): 7 lb (3.175 kg)   Body Composition  Body Fat %: 35.6 % Fat Mass (lbs): 113.6 lbs Muscle Mass (lbs): 195.6 lbs Total Body Water (lbs): 145.4 lbs Visceral Fat Rating : 21   Other Clinical Data Fasting: No Labs: No Today's Visit #: 18 Starting Date: 06/27/22     HPI  Chief Complaint: OBESITY  Thomas [name] is here to discuss his progress with his obesity treatment plan. He is on the the Category 4 Plan and states he is following his eating plan approximately 40 % of the time. He states he is exercising 0 minutes 0 days per week.   Interval History:  Since last office visit he has gained 2 pounds.  He saw nephrology since his last visit.  Has been scheduled for a renal US .  He is drinking water and body armor daily.    Water weight up 3 lbs  Pharmacotherapy for weight loss: He is currently taking Zepbound  5 mg for medical weight loss.  Noted a headache after first injection.  Has resolved and denies any side effects now.     Avoid phentermine, Qsymia and Contrave due to hypertension, atrial fibrillation, CKD, CHF and history of CVA.    Previous pharmacotherapy for medical weight loss:  None   Bariatric surgery:  Patient has not had bariatric surgery  Hypertension Hypertension looks better today.  He saw nephrology last on 06/17/24 and has a cardiology appt on 07/30/24.  Has a follow up appt with cardiology on 07/30/24 Medication(s): Cozaar 100 mg, apresoline  100mg , stopped lasix and was started on  chlorthalidone 25mg  with potassium 10 meq  Denies chest pain, palpitations and SOB.  BP Readings from Last 3 Encounters:  06/29/24 139/86  06/12/24 (!) 153/97  05/12/24 (!) 172/89   Lab Results  Component Value Date   CREATININE 1.43 (H) 06/12/2024   CREATININE 1.52 (H) 08/06/2023   CREATININE 1.75 (H) 06/27/2022      PHYSICAL EXAM:  Blood pressure 139/86, pulse 80, temperature 98.2 F (36.8 C), height 6' 1 (1.854 m), weight (!) 319 lb (144.7 kg), SpO2 98%. Body mass index is 42.09 kg/m.  General: He is overweight, cooperative, alert, well developed, and in no acute distress. PSYCH: Has normal mood, affect and thought process.   Extremities: No edema.  Neurologic: No gross sensory or motor deficits. No tremors or fasciculations noted.    DIAGNOSTIC DATA REVIEWED:  BMET    Component Value Date/Time   NA 139 06/12/2024 1100   NA 141 08/06/2023 1153   K 3.9 06/12/2024 1100   CL 103 06/12/2024 1100   CO2 25 06/12/2024 1100   GLUCOSE 88 06/12/2024 1100   BUN 20 06/12/2024 1100   BUN 22 08/06/2023 1153   CREATININE 1.43 (H) 06/12/2024 1100   CREATININE 1.61 (H) 03/29/2022 1525   CALCIUM  9.4 06/12/2024 1100   GFRNONAA >60 06/12/2024 1100   GFRAA 70 07/28/2020 0944   Lab Results  Component Value Date   HGBA1C 5.9 (H) 08/06/2023   HGBA1C  12/04/2008    5.5 (NOTE)   The ADA recommends the following therapeutic goal for glycemic   control related to Hgb A1C measurement:   Goal of Therapy:   < 7.0% Hgb A1C   Reference: American Diabetes Association: Clinical Practice   Recommendations 2008, Diabetes Care,  2008, 31:(Suppl 1).   Lab Results  Component Value Date   INSULIN  27.5 (H) 08/06/2023   Lab Results  Component Value Date   TSH 1.240 08/06/2023   CBC    Component Value Date/Time   WBC 4.0 06/12/2024 1100   RBC 4.72 06/12/2024 1100   HGB 13.8 06/12/2024 1100   HGB 13.7 08/27/2023 1232   HCT 41.3 06/12/2024 1100   HCT 42.2 08/27/2023 1232   PLT 160  06/12/2024 1100   PLT 155 08/27/2023 1232   MCV 87.5 06/12/2024 1100   MCV 91 08/27/2023 1232   MCH 29.2 06/12/2024 1100   MCHC 33.4 06/12/2024 1100   RDW 14.2 06/12/2024 1100   RDW 14.2 08/27/2023 1232   Iron Studies No results found for: IRON, TIBC, FERRITIN, IRONPCTSAT Lipid Panel     Component Value Date/Time   CHOL 120 08/06/2023 1153   TRIG 71 08/06/2023 1153   HDL 51 08/06/2023 1153   CHOLHDL 2.5 06/27/2022 1115   CHOLHDL 2 07/27/2021 0907   VLDL 11.0 07/27/2021 0907   LDLCALC 54 08/06/2023 1153   LDLCALC 82 04/09/2021 1417   Hepatic Function Panel     Component Value Date/Time   PROT 7.5 06/12/2024 1100   PROT 7.2 08/06/2023 1153   ALBUMIN 4.3 06/12/2024 1100   ALBUMIN 4.5 08/06/2023 1153   AST 45 (H) 06/12/2024 1100   ALT 39 06/12/2024 1100   ALKPHOS 50 06/12/2024 1100   BILITOT 0.5 06/12/2024 1100   BILITOT 0.5 08/06/2023 1153   BILIDIR 0.2 07/27/2021 0907      Component Value Date/Time   TSH 1.240 08/06/2023 1153   Nutritional Lab Results  Component Value Date   VD25OH 27.3 (L) 08/06/2023   VD25OH 33.6 06/27/2022     ASSESSMENT AND PLAN  TREATMENT PLAN FOR OBESITY:  Recommended Dietary Goals  Thomas [name] is currently in the action stage of change. As such, his goal is to continue weight management plan. He has agreed to keeping a food journal and adhering to recommended goals of 2000 calories and 100+ grams of protein.  Behavioral Intervention  We discussed the following Behavioral Modification Strategies today: increasing lean protein intake to established goals, decreasing simple carbohydrates , increasing vegetables, increasing fiber rich foods, increasing water intake , work on meal planning and preparation, work on tracking and journaling calories using tracking application, and continue to work on maintaining a reduced calorie state, getting the recommended amount of protein, incorporating whole foods, making healthy choices, staying well  hydrated and practicing mindfulness when eating..  Additional resources provided today: NA  Recommended Physical Activity Goals  Thomas [name] has been advised to work up to 150 minutes of moderate intensity aerobic activity a week and strengthening exercises 2-3 times per week for cardiovascular health, weight loss maintenance and preservation of muscle mass.   He has agreed to Think about enjoyable ways to increase daily physical activity and overcoming barriers to exercise, Increase physical activity in their day and reduce sedentary time (increase NEAT)., and Work on scheduling and tracking physical activity.    Pharmacotherapy We discussed various medication options to help Griffyn with his weight loss efforts and we both agreed to increase Zepbound  7.5 mg.  Side effects discussed.  Avoid phentermine, Qsymia and Contrave due to hypertension, atrial fibrillation, CKD, CHF and history of CVA.   ASSOCIATED CONDITIONS ADDRESSED TODAY  Action/Plan  Essential hypertension Continue to follow up with PCP and cardiology.  Continue meds as directed  Watch for LEE, SHOB and weight gain  Will continue to monitor total body of water.  Will see him back for follow up prior to his next visit with cardiology  Class 3 severe obesity due to excess calories with serious comorbidity and body mass index (BMI) of 40.0 to 44.9 in adult -     Zepbound ; Inject 7.5 mg into the skin once a week.  Dispense: 2 mL; Refill: 0         Return in about 4 weeks (around 07/27/2024).SABRA He was informed of the importance of frequent follow up visits to maximize his success with intensive lifestyle modifications for his multiple health conditions.   ATTESTASTION STATEMENTS:  Reviewed by clinician on day of visit: allergies, medications, problem list, medical history, surgical history, family history, social history, and previous encounter notes.     Thomas [name]. Thomas Easler FNP-C

## 2024-07-26 ENCOUNTER — Encounter: Payer: Self-pay | Admitting: Nurse Practitioner

## 2024-07-26 ENCOUNTER — Ambulatory Visit (INDEPENDENT_AMBULATORY_CARE_PROVIDER_SITE_OTHER): Admitting: Nurse Practitioner

## 2024-07-26 VITALS — BP 148/92 | HR 69 | Temp 98.0°F | Ht 73.0 in | Wt 316.0 lb

## 2024-07-26 DIAGNOSIS — E559 Vitamin D deficiency, unspecified: Secondary | ICD-10-CM

## 2024-07-26 DIAGNOSIS — E66813 Obesity, class 3: Secondary | ICD-10-CM

## 2024-07-26 DIAGNOSIS — I10 Essential (primary) hypertension: Secondary | ICD-10-CM

## 2024-07-26 DIAGNOSIS — G4733 Obstructive sleep apnea (adult) (pediatric): Secondary | ICD-10-CM

## 2024-07-26 DIAGNOSIS — Z6841 Body Mass Index (BMI) 40.0 and over, adult: Secondary | ICD-10-CM

## 2024-07-26 MED ORDER — ZEPBOUND 10 MG/0.5ML ~~LOC~~ SOAJ
10.0000 mg | SUBCUTANEOUS | 0 refills | Status: DC
Start: 2024-07-26 — End: 2024-08-26

## 2024-07-26 NOTE — Progress Notes (Signed)
 Office: [phone]  /  Fax: [phone]  WEIGHT SUMMARY AND BIOMETRICS  Weight Lost Since Last Visit: 3lb  Weight Gained Since Last Visit: 0lb   Vitals Temp: 98 F (36.7 C) BP: (!) 148/92 (Manual) Pulse Rate: 69 SpO2: 97 %   Anthropometric Measurements Height: 6' 1 (1.854 m) Weight: (!) 316 lb (143.3 kg) BMI (Calculated): 41.7 Weight at Last Visit: 319lb Weight Lost Since Last Visit: 3lb Weight Gained Since Last Visit: 0lb Starting Weight: 326lb Total Weight Loss (lbs): 9 lb (4.082 kg)   Body Composition  Body Fat %: 35.7 % Fat Mass (lbs): 112.8 lbs Muscle Mass (lbs): 193.4 lbs Total Body Water (lbs): 141.6 lbs Visceral Fat Rating : 21   Other Clinical Data Fasting: No Labs: No Today's Visit #: 19 Starting Date: 06/27/22     HPI  Chief Complaint: OBESITY  Koby is here to discuss his progress with his obesity treatment plan. He is on the the Category 4 Plan and states he is following his eating plan approximately 30 % of the time. He states he is exercising 0 minutes 0 days per week.   Interval History:  Since last office visit he has lost 3 pounds.  He is struggling with shoulder pain and is currently taking prednisone .  He is following the meal plan 30% of the time and is not exercising.  He is under stress at home helping care for his mother in law and his son.    He is scheduled for an eye exam Oct 15th.    Pharmacotherapy for weight loss: He is currently taking Zepbound  7.5 mg for medical weight loss.  Denies side effects.     Avoid phentermine, Qsymia and Contrave due to hypertension, atrial fibrillation, CKD, CHF and history of CVA.    Previous pharmacotherapy for medical weight loss:  Wegovy    Bariatric surgery:  Patient has not had bariatric surgery  Hypertension Hypertension BP is elevated today. He saw nephrology last on 06/17/24 and has a cardiology appt on 07/30/24. He recently had a renal US .   Medication(s): Cozaar 100 mg,  apresoline  100mg , chlorthalidone 25mg  with potassium 10 meq  Denies chest pain and SOB.  Went to ED on 06/12/24 Last BMP was 06/17/24-creat 1.70, BUN was 19, potassium was 4.1  BP Readings from Last 3 Encounters:  07/26/24 (!) 148/92  06/29/24 139/86  06/12/24 (!) 153/97   Lab Results  Component Value Date   CREATININE 1.43 (H) 06/12/2024   CREATININE 1.52 (H) 08/06/2023   CREATININE 1.75 (H) 06/27/2022      Vit D deficiency  Last Vit D was 72.7 on 06/17/24. He stopped taking Vit D weekly and is currently taking calcitriol 0.25mcg.    Lab Results  Component Value Date   VD25OH 27.3 (L) 08/06/2023   VD25OH 33.6 06/27/2022     Obstructive Sleep Apnea Khyri has a diagnosis of sleep apnea. He reports that he is using a CPAP regularly.      PHYSICAL EXAM:  Blood pressure (!) 148/92, pulse 69, temperature 98 F (36.7 C), height 6' 1 (1.854 m), weight (!) 316 lb (143.3 kg), SpO2 97%. Body mass index is 41.69 kg/m.  General: He is overweight, cooperative, alert, well developed, and in no acute distress. PSYCH: Has normal mood, affect and thought process.   Extremities: No edema.  Neurologic: No gross sensory or motor deficits. No tremors or fasciculations noted.    DIAGNOSTIC DATA REVIEWED:  BMET    Component Value Date/Time  NA 139 06/12/2024 1100   NA 141 08/06/2023 1153   K 3.9 06/12/2024 1100   CL 103 06/12/2024 1100   CO2 25 06/12/2024 1100   GLUCOSE 88 06/12/2024 1100   BUN 20 06/12/2024 1100   BUN 22 08/06/2023 1153   CREATININE 1.43 (H) 06/12/2024 1100   CREATININE 1.61 (H) 03/29/2022 1525   CALCIUM  9.4 06/12/2024 1100   GFRNONAA >60 06/12/2024 1100   GFRAA 70 07/28/2020 0944   Lab Results  Component Value Date   HGBA1C 5.9 (H) 08/06/2023   HGBA1C  12/04/2008    5.5 (NOTE)   The ADA recommends the following therapeutic goal for glycemic   control related to Hgb A1C measurement:   Goal of Therapy:   < 7.0% Hgb A1C   Reference: American Diabetes  Association: Clinical Practice   Recommendations 2008, Diabetes Care,  2008, 31:(Suppl 1).   Lab Results  Component Value Date   INSULIN  27.5 (H) 08/06/2023   Lab Results  Component Value Date   TSH 1.240 08/06/2023   CBC    Component Value Date/Time   WBC 4.0 06/12/2024 1100   RBC 4.72 06/12/2024 1100   HGB 13.8 06/12/2024 1100   HGB 13.7 08/27/2023 1232   HCT 41.3 06/12/2024 1100   HCT 42.2 08/27/2023 1232   PLT 160 06/12/2024 1100   PLT 155 08/27/2023 1232   MCV 87.5 06/12/2024 1100   MCV 91 08/27/2023 1232   MCH 29.2 06/12/2024 1100   MCHC 33.4 06/12/2024 1100   RDW 14.2 06/12/2024 1100   RDW 14.2 08/27/2023 1232   Iron Studies No results found for: IRON, TIBC, FERRITIN, IRONPCTSAT Lipid Panel     Component Value Date/Time   CHOL 120 08/06/2023 1153   TRIG 71 08/06/2023 1153   HDL 51 08/06/2023 1153   CHOLHDL 2.5 06/27/2022 1115   CHOLHDL 2 07/27/2021 0907   VLDL 11.0 07/27/2021 0907   LDLCALC 54 08/06/2023 1153   LDLCALC 82 04/09/2021 1417   Hepatic Function Panel     Component Value Date/Time   PROT 7.5 06/12/2024 1100   PROT 7.2 08/06/2023 1153   ALBUMIN 4.3 06/12/2024 1100   ALBUMIN 4.5 08/06/2023 1153   AST 45 (H) 06/12/2024 1100   ALT 39 06/12/2024 1100   ALKPHOS 50 06/12/2024 1100   BILITOT 0.5 06/12/2024 1100   BILITOT 0.5 08/06/2023 1153   BILIDIR 0.2 07/27/2021 0907      Component Value Date/Time   TSH 1.240 08/06/2023 1153   Nutritional Lab Results  Component Value Date   VD25OH 27.3 (L) 08/06/2023   VD25OH 33.6 06/27/2022     ASSESSMENT AND PLAN  TREATMENT PLAN FOR OBESITY:  Recommended Dietary Goals  Rahn is currently in the action stage of change. As such, his goal is to continue weight management plan. He has agreed to track and will review at next visit.  Behavioral Intervention  We discussed the following Behavioral Modification Strategies today: increasing lean protein intake to established goals,  decreasing simple carbohydrates , increasing vegetables, increasing fiber rich foods, increasing water intake , work on meal planning and preparation, work on tracking and journaling calories using tracking application, reading food labels , keeping healthy foods at home, continue to work on maintaining a reduced calorie state, getting the recommended amount of protein, incorporating whole foods, making healthy choices, staying well hydrated and practicing mindfulness when eating., and increase protein intake, fibrous foods (25 grams per day for women, 30 grams for men) and water to  improve satiety and decrease hunger signals. .  Additional resources provided today: NA  Recommended Physical Activity Goals  Meziah has been advised to work up to 150 minutes of moderate intensity aerobic activity a week and strengthening exercises 2-3 times per week for cardiovascular health, weight loss maintenance and preservation of muscle mass.   He has agreed to Think about enjoyable ways to increase daily physical activity and overcoming barriers to exercise, Increase physical activity in their day and reduce sedentary time (increase NEAT)., and Work on scheduling and tracking physical activity.    Pharmacotherapy We discussed various medication options to help Waylyn with his weight loss efforts and we both agreed to finish Zepbound  7.5mg  and increase to 10mg .  Side effects discussed. To discuss Wegovy  and Zepbound  with cardiology and to let me know next week if he would like to switch to Wegovy  or continue zepbound .    Select study reviewed with patient today.    ASSOCIATED CONDITIONS ADDRESSED TODAY  Action/Plan  Essential hypertension His BP continues to be elevated.  Is imperative that he keep his appointment with cardiology to discuss in more detail.  Continue medications as directed.  Vitamin D  deficiency Continue as directed.  He recently stopped vitamin D  50,000 IU weekly.  OSA (obstructive sleep  apnea) Continue using CPAP nightly.  Class 3 severe obesity due to excess calories with serious comorbidity and body mass index (BMI) of 40.0 to 44.9 in adult     Will obtain labs at next visit, hepatic function panel, lipids, A1c and TSH    Return in about 4 weeks (around 08/23/2024).SABRA He was informed of the importance of frequent follow up visits to maximize his success with intensive lifestyle modifications for his multiple health conditions.   ATTESTASTION STATEMENTS:  Reviewed by clinician on day of visit: allergies, medications, problem list, medical history, surgical history, family history, social history, and previous encounter notes.     Corean SAUNDERS. Trenyce Loera FNP-C

## 2024-07-29 NOTE — Progress Notes (Unsigned)
  Cardiology Office Note:   Date:  07/29/2024  ID:  Thomas [name] Cleatus Mickey., DOB 1981/09/17, MRN [id number] PCP: Catherine Charlies DELENA, DO  [facility] Providers Cardiologist:  Lynwood Schilling, MD {  History of Present Illness:   Thomas [name]. is a 43 y.o. male  who presents for follow up of atrial fib, HTN and a CVA. He has been treated with tPA in past for his CVA.  He has had uncontrolled hypertension.     Working with [facility].  He has started CPAP.  Since I last saw him he was in the emergency room with a headache.  He was hypertensive.  CT of the head demonstrated no acute findings.  His blood pressure gradually improved.    ***  ***  Since I last saw him he had his son Massie Bath.  He says this is going well.  He is now 72 months old.  He is still doing a little bit of walking for exercise but not as much since he is a new father.  He is not sleeping as well and uses his CPAP when he can.  He has just started Wegovy .  He has not yet lost weight with it but it is being up titrated.  ROS: ***  Studies Reviewed:    EKG:       ***  Risk Assessment/Calculations:   {Does this patient have ATRIAL FIBRILLATION?:[id number]} No BP recorded.  {Refresh Note OR Click here to enter BP  :1}***        Physical Exam:   VS:  There were no vitals taken for this visit.   Wt Readings from Last 3 Encounters:  07/26/24 (!) 316 lb (143.3 kg)  06/29/24 (!) 319 lb (144.7 kg)  06/12/24 (!) 317 lb (143.8 kg)     GEN: Well nourished, well developed in no acute distress NECK: No JVD; No carotid bruits CARDIAC: ***RR, *** murmurs, rubs, gallops RESPIRATORY:  Clear to auscultation without rales, wheezing or rhonchi  ABDOMEN: Soft, non-tender, non-distended EXTREMITIES:  No edema; No deformity   ASSESSMENT AND PLAN:   ATRIAL FIB:  Mr. Dawit Tankard. has a [name]2DS2 - VASc score of 3.  ***    He tolerates anticoagulation.  No change in therapy.   CVA:   ***  He has some mild  residual left arm weakness.   He will continue with blood pressure control and meds as listed.    LVH : His last ejection fraction was ***  55 to 60%.  No change in therapy.   HTN:     His blood pressure *** on maximal therapy is still not at target.  I really do not want to start minoxidil.  I really would like to continue therapies as listed and have him lose the weight and then if he still not at target despite CPAP weight loss maximal therapy we might consider renal denervation.    OBESITY:   ***   I will see him back in 6 months after he has had med titration to see if he has been more successful.         Follow up ***  Signed, Lynwood Schilling, MD

## 2024-07-30 ENCOUNTER — Other Ambulatory Visit (HOSPITAL_COMMUNITY): Payer: Self-pay

## 2024-07-30 ENCOUNTER — Encounter: Payer: Self-pay | Admitting: Cardiology

## 2024-07-30 ENCOUNTER — Ambulatory Visit: Attending: Cardiology | Admitting: Cardiology

## 2024-07-30 VITALS — BP 136/85 | HR 89 | Ht 73.0 in | Wt 315.0 lb

## 2024-07-30 DIAGNOSIS — I517 Cardiomegaly: Secondary | ICD-10-CM

## 2024-07-30 DIAGNOSIS — I4891 Unspecified atrial fibrillation: Secondary | ICD-10-CM | POA: Diagnosis not present

## 2024-07-30 DIAGNOSIS — I10 Essential (primary) hypertension: Secondary | ICD-10-CM | POA: Diagnosis not present

## 2024-07-30 MED ORDER — CLONIDINE 0.3 MG/24HR TD PTWK
0.3000 mg | MEDICATED_PATCH | TRANSDERMAL | 29 refills | Status: AC
  Filled 2024-07-30: qty 12, 84d supply, fill #0
  Filled 2024-07-30 – 2024-07-31 (×2): qty 4, 28d supply, fill #0

## 2024-07-30 NOTE — Patient Instructions (Signed)

## 2024-07-31 ENCOUNTER — Other Ambulatory Visit (HOSPITAL_COMMUNITY): Payer: Self-pay

## 2024-08-26 ENCOUNTER — Ambulatory Visit (INDEPENDENT_AMBULATORY_CARE_PROVIDER_SITE_OTHER): Admitting: Nurse Practitioner

## 2024-08-26 ENCOUNTER — Encounter: Payer: Self-pay | Admitting: Nurse Practitioner

## 2024-08-26 VITALS — BP 131/83 | HR 71 | Temp 98.4°F | Ht 73.0 in | Wt 314.0 lb

## 2024-08-26 DIAGNOSIS — E66813 Obesity, class 3: Secondary | ICD-10-CM

## 2024-08-26 DIAGNOSIS — I10 Essential (primary) hypertension: Secondary | ICD-10-CM

## 2024-08-26 DIAGNOSIS — Z6841 Body Mass Index (BMI) 40.0 and over, adult: Secondary | ICD-10-CM

## 2024-08-26 MED ORDER — ZEPBOUND 12.5 MG/0.5ML ~~LOC~~ SOAJ: 12.5000 mg | SUBCUTANEOUS | 0 refills | Status: DC

## 2024-08-26 NOTE — Progress Notes (Signed)
 Office: [phone]  /  Fax: [phone]  WEIGHT SUMMARY AND BIOMETRICS  Weight Lost Since Last Visit: 2lb  Weight Gained Since Last Visit: 0lb   Vitals Temp: 98.4 F (36.9 C) BP: 131/83 Pulse Rate: 71 SpO2: 97 %   Anthropometric Measurements Height: 6' 1 (1.854 m) Weight: (!) 314 lb (142.4 kg) BMI (Calculated): 41.44 Weight at Last Visit: 316lb Weight Lost Since Last Visit: 2lb Weight Gained Since Last Visit: 0lb Starting Weight: 326lb Total Weight Loss (lbs): 12 lb (5.443 kg)   Body Composition  Body Fat %: 34.7 % Fat Mass (lbs): 109 lbs Muscle Mass (lbs): 195.4 lbs Total Body Water (lbs): 139 lbs Visceral Fat Rating : 20   Other Clinical Data Fasting: Yes Labs: No Today's Visit #: 20 Starting Date: 06/27/22     HPI  Chief Complaint: OBESITY  Thomas [name] is here to discuss his progress with his obesity treatment plan. He is on the the Category 4 Plan and states he is following his eating plan approximately 40 % of the time. He states he is exercising 90 minutes 2 days per week.   Interval History:  Since last office visit he has lost 2 pounds.  He is drinking water and a protein shake daily and occ diet soda.  He is going to the gym 2 days per week-30 mins treadmill, 30 mins elliptical and 30 resistance training. Notes polyphagia and cravings.   Patient has a past medical history of atrial fibrillation, hypertension, hypertensive heart disease without heart failure, obstructive sleep apnea syndrome on CPAP, CKD, history of CVA, stage III CKD and vitamin D  deficiency.  Pharmacotherapy for weight loss: He is currently taking Zepbound  7.5 mg for medical weight loss.  Denies side effects.   He had an eye exam on Oct 15th.     Avoid phentermine, Qsymia and Contrave due to hypertension, atrial fibrillation, CKD, CHF and history of CVA.    Previous pharmacotherapy for medical weight loss:  Wegovy    Bariatric surgery:  Patient has not had bariatric  surgery  Hypertension Hypertension looks better today. He saw cardiology last on 07/30/24 and He saw nephrology last on 06/17/24  Medication(s):  Cozaar 100 mg, apresoline  100mg , chlorthalidone 25mg  with potassium 10 meq  Denies chest pain, palpitations and SOB.  BP Readings from Last 3 Encounters:  08/26/24 131/83  07/30/24 136/85  07/26/24 (!) 148/92   Lab Results  Component Value Date   CREATININE 1.43 (H) 06/12/2024   CREATININE 1.52 (H) 08/06/2023   CREATININE 1.75 (H) 06/27/2022      PHYSICAL EXAM:  Blood pressure 131/83, pulse 71, temperature 98.4 F (36.9 C), height 6' 1 (1.854 m), weight (!) 314 lb (142.4 kg), SpO2 97%. Body mass index is 41.43 kg/m.  General: He is overweight, cooperative, alert, well developed, and in no acute distress. PSYCH: Has normal mood, affect and thought process.   Extremities: No edema.  Neurologic: No gross sensory or motor deficits. No tremors or fasciculations noted.    DIAGNOSTIC DATA REVIEWED:  BMET    Component Value Date/Time   NA 139 06/12/2024 1100   NA 141 08/06/2023 1153   K 3.9 06/12/2024 1100   CL 103 06/12/2024 1100   CO2 25 06/12/2024 1100   GLUCOSE 88 06/12/2024 1100   BUN 20 06/12/2024 1100   BUN 22 08/06/2023 1153   CREATININE 1.43 (H) 06/12/2024 1100   CREATININE 1.61 (H) 03/29/2022 1525   CALCIUM  9.4 06/12/2024 1100   GFRNONAA >60 06/12/2024 1100  GFRAA 70 07/28/2020 0944   Lab Results  Component Value Date   HGBA1C 5.9 (H) 08/06/2023   HGBA1C  12/04/2008    5.5 (NOTE)   The ADA recommends the following therapeutic goal for glycemic   control related to Hgb A1C measurement:   Goal of Therapy:   < 7.0% Hgb A1C   Reference: American Diabetes Association: Clinical Practice   Recommendations 2008, Diabetes Care,  2008, 31:(Suppl 1).   Lab Results  Component Value Date   INSULIN  27.5 (H) 08/06/2023   Lab Results  Component Value Date   TSH 1.240 08/06/2023   CBC    Component Value Date/Time   WBC  4.0 06/12/2024 1100   RBC 4.72 06/12/2024 1100   HGB 13.8 06/12/2024 1100   HGB 13.7 08/27/2023 1232   HCT 41.3 06/12/2024 1100   HCT 42.2 08/27/2023 1232   PLT 160 06/12/2024 1100   PLT 155 08/27/2023 1232   MCV 87.5 06/12/2024 1100   MCV 91 08/27/2023 1232   MCH 29.2 06/12/2024 1100   MCHC 33.4 06/12/2024 1100   RDW 14.2 06/12/2024 1100   RDW 14.2 08/27/2023 1232   Iron Studies No results found for: IRON, TIBC, FERRITIN, IRONPCTSAT Lipid Panel     Component Value Date/Time   CHOL 120 08/06/2023 1153   TRIG 71 08/06/2023 1153   HDL 51 08/06/2023 1153   CHOLHDL 2.5 06/27/2022 1115   CHOLHDL 2 07/27/2021 0907   VLDL 11.0 07/27/2021 0907   LDLCALC 54 08/06/2023 1153   LDLCALC 82 04/09/2021 1417   Hepatic Function Panel     Component Value Date/Time   PROT 7.5 06/12/2024 1100   PROT 7.2 08/06/2023 1153   ALBUMIN 4.3 06/12/2024 1100   ALBUMIN 4.5 08/06/2023 1153   AST 45 (H) 06/12/2024 1100   ALT 39 06/12/2024 1100   ALKPHOS 50 06/12/2024 1100   BILITOT 0.5 06/12/2024 1100   BILITOT 0.5 08/06/2023 1153   BILIDIR 0.2 07/27/2021 0907      Component Value Date/Time   TSH 1.240 08/06/2023 1153   Nutritional Lab Results  Component Value Date   VD25OH 27.3 (L) 08/06/2023   VD25OH 33.6 06/27/2022     ASSESSMENT AND PLAN  TREATMENT PLAN FOR OBESITY:  Recommended Dietary Goals  Thomas [name] is currently in the action stage of change. As such, his goal is to continue weight management plan. He has agreed to the Category 4 Plan.  Behavioral Intervention  We discussed the following Behavioral Modification Strategies today: increasing lean protein intake to established goals, decreasing simple carbohydrates , increasing vegetables, increasing fiber rich foods, increasing water intake , work on meal planning and preparation, reading food labels , keeping healthy foods at home, continue to work on maintaining a reduced calorie state, getting the recommended amount of  protein, incorporating whole foods, making healthy choices, staying well hydrated and practicing mindfulness when eating., and increase protein intake, fibrous foods (25 grams per day for women, 30 grams for men) and water to improve satiety and decrease hunger signals. .  Additional resources provided today: NA  Recommended Physical Activity Goals  Thomas [name] has been advised to work up to 150 minutes of moderate intensity aerobic activity a week and strengthening exercises 2-3 times per week for cardiovascular health, weight loss maintenance and preservation of muscle mass.   He has agreed to Think about enjoyable ways to increase daily physical activity and overcoming barriers to exercise, Increase physical activity in their day and reduce sedentary time (increase NEAT).,  Continue to gradually increase the amount and intensity of exercise routine, Increase volume of physical activity to a goal of 240 minutes a week, and Combine aerobic and strengthening exercises for efficiency and improved cardiometabolic health.   Pharmacotherapy We discussed various medication options to help Thomas [name] with his weight loss efforts and we both agreed to increase Zepbound  12.5mg .  Side effects discussed.  I have discussed switching him over to Wegovy  after his last visit and asked him to discuss with cardiology.  He saw cardiology last on 07/30/24 and per cardiology's notes they are aware that he is taking Zepbound  and to continue working on weight loss and exercising.  ASSOCIATED CONDITIONS ADDRESSED TODAY  Action/Plan  Essential hypertension Continue follow-up with cardiology and PCP.  Continue medications as directed.  Class 3 severe obesity due to excess calories with serious comorbidity and body mass index (BMI) of 40.0 to 44.9 in adult -     Increase Zepbound ; Inject 12.5 mg into the skin once a week.  Dispense: 2 mL; Refill: 0.  Side effects discussed.       Bio impedance reviewed with patient.   Return  in about 4 weeks (around 09/23/2024).SABRA He was informed of the importance of frequent follow up visits to maximize his success with intensive lifestyle modifications for his multiple health conditions.   ATTESTASTION STATEMENTS:  Reviewed by clinician on day of visit: allergies, medications, problem list, medical history, surgical history, family history, social history, and previous encounter notes.     Corean SAUNDERS. Burlin Mcnair FNP-C

## 2024-09-21 ENCOUNTER — Ambulatory Visit: Admitting: Nurse Practitioner

## 2024-09-21 ENCOUNTER — Encounter: Payer: Self-pay | Admitting: Nurse Practitioner

## 2024-09-21 VITALS — BP 146/85 | HR 73 | Temp 97.6°F | Ht 73.0 in | Wt 315.0 lb

## 2024-09-21 DIAGNOSIS — I10 Essential (primary) hypertension: Secondary | ICD-10-CM | POA: Diagnosis not present

## 2024-09-21 DIAGNOSIS — E66813 Obesity, class 3: Secondary | ICD-10-CM

## 2024-09-21 DIAGNOSIS — Z6841 Body Mass Index (BMI) 40.0 and over, adult: Secondary | ICD-10-CM | POA: Diagnosis not present

## 2024-09-21 MED ORDER — ZEPBOUND 15 MG/0.5ML ~~LOC~~ SOAJ: 15.0000 mg | SUBCUTANEOUS | 0 refills | Status: DC

## 2024-09-21 NOTE — Progress Notes (Signed)
 Office: [phone]  /  Fax: [phone]  WEIGHT SUMMARY AND BIOMETRICS  Weight Lost Since Last Visit: 0  Weight Gained Since Last Visit: 1lb   Vitals Temp: 97.6 F (36.4 C) BP: (!) 146/85 Pulse Rate: 73 SpO2: 99 %   Anthropometric Measurements Height: 6' 1 (1.854 m) Weight: (!) 315 lb (142.9 kg) BMI (Calculated): 41.57 Weight at Last Visit: 314lb Weight Lost Since Last Visit: 0 Weight Gained Since Last Visit: 1lb Starting Weight: 326lb Total Weight Loss (lbs): 11 lb (4.99 kg)   Body Composition  Body Fat %: 35.4 % Fat Mass (lbs): 111.8 lbs Muscle Mass (lbs): 193.8 lbs Total Body Water (lbs): 140.2 lbs Visceral Fat Rating : 21   Other Clinical Data Fasting: no Labs: no Today's Visit #: 21 Starting Date: 06/27/22     HPI  Chief Complaint: OBESITY  Thomas [name] is here to discuss his progress with his obesity treatment plan. He is on the the Category 4 Plan and states he is following his eating plan approximately 40 % of the time. He states he is exercising 90 minutes 2 days per week.   Interval History:  Since last office visit he has gained 1 pound.  He doesn't have a regular eating schedule. He is eating late at night-unusually after 10:30 pm.  BF:  veggie muffins or chicken sausage with eggs, snack:  protein shake, lunch:  [employer] or skips or chick-fil-a nuggets with fries, snack:  fig bars and dinner:  chicken, rice and corn bread.  He is drinking water with mio.  He is going to the gym 2 days per week-treadmill and elliptical.    Patient has a past medical history of atrial fibrillation, hypertension, hypertensive heart disease without heart failure, obstructive sleep apnea syndrome on CPAP, CKD, history of CVA, stage III CKD and vitamin D  deficiency.   Pharmacotherapy for weight loss: He is currently taking Zepbound  12.5 mg for medical weight loss (increased dose after last visit).  Denies side effects.   Avoid phentermine, Qsymia and Contrave due to  hypertension, atrial fibrillation, CKD, CHF and history of CVA.    Previous pharmacotherapy for medical weight loss:  Wegovy    Bariatric surgery:  Patient has not had bariatric surgery  Hypertension Hypertension poorly controlled. He saw cardiology last on 07/30/24 and He saw nephrology last on 06/17/24  Medication(s): Cozaar 100 mg, apresoline  100mg , chlorthalidone 25mg  with potassium 10 meq. Denies side effects.  Denies chest pain, palpitations and SOB.  BP Readings from Last 3 Encounters:  09/21/24 (!) 146/85  08/26/24 131/83  07/30/24 136/85   Lab Results  Component Value Date   CREATININE 1.43 (H) 06/12/2024   CREATININE 1.52 (H) 08/06/2023   CREATININE 1.75 (H) 06/27/2022     PHYSICAL EXAM:  Blood pressure (!) 146/85, pulse 73, temperature 97.6 F (36.4 C), height 6' 1 (1.854 m), weight (!) 315 lb (142.9 kg), SpO2 99%. Body mass index is 41.56 kg/m.  General: He is overweight, cooperative, alert, well developed, and in no acute distress. PSYCH: Has normal mood, affect and thought process.   Extremities: No edema.  Neurologic: No gross sensory or motor deficits. No tremors or fasciculations noted.    DIAGNOSTIC DATA REVIEWED:  BMET    Component Value Date/Time   NA 139 06/12/2024 1100   NA 141 08/06/2023 1153   K 3.9 06/12/2024 1100   CL 103 06/12/2024 1100   CO2 25 06/12/2024 1100   GLUCOSE 88 06/12/2024 1100   BUN 20 06/12/2024 1100  BUN 22 08/06/2023 1153   CREATININE 1.43 (H) 06/12/2024 1100   CREATININE 1.61 (H) 03/29/2022 1525   CALCIUM  9.4 06/12/2024 1100   GFRNONAA >60 06/12/2024 1100   GFRAA 70 07/28/2020 0944   Lab Results  Component Value Date   HGBA1C 5.9 (H) 08/06/2023   HGBA1C  12/04/2008    5.5 (NOTE)   The ADA recommends the following therapeutic goal for glycemic   control related to Hgb A1C measurement:   Goal of Therapy:   < 7.0% Hgb A1C   Reference: American Diabetes Association: Clinical Practice   Recommendations 2008, Diabetes  Care,  2008, 31:(Suppl 1).   Lab Results  Component Value Date   INSULIN  27.5 (H) 08/06/2023   Lab Results  Component Value Date   TSH 1.240 08/06/2023   CBC    Component Value Date/Time   WBC 4.0 06/12/2024 1100   RBC 4.72 06/12/2024 1100   HGB 13.8 06/12/2024 1100   HGB 13.7 08/27/2023 1232   HCT 41.3 06/12/2024 1100   HCT 42.2 08/27/2023 1232   PLT 160 06/12/2024 1100   PLT 155 08/27/2023 1232   MCV 87.5 06/12/2024 1100   MCV 91 08/27/2023 1232   MCH 29.2 06/12/2024 1100   MCHC 33.4 06/12/2024 1100   RDW 14.2 06/12/2024 1100   RDW 14.2 08/27/2023 1232   Iron Studies No results found for: IRON, TIBC, FERRITIN, IRONPCTSAT Lipid Panel     Component Value Date/Time   CHOL 120 08/06/2023 1153   TRIG 71 08/06/2023 1153   HDL 51 08/06/2023 1153   CHOLHDL 2.5 06/27/2022 1115   CHOLHDL 2 07/27/2021 0907   VLDL 11.0 07/27/2021 0907   LDLCALC 54 08/06/2023 1153   LDLCALC 82 04/09/2021 1417   Hepatic Function Panel     Component Value Date/Time   PROT 7.5 06/12/2024 1100   PROT 7.2 08/06/2023 1153   ALBUMIN 4.3 06/12/2024 1100   ALBUMIN 4.5 08/06/2023 1153   AST 45 (H) 06/12/2024 1100   ALT 39 06/12/2024 1100   ALKPHOS 50 06/12/2024 1100   BILITOT 0.5 06/12/2024 1100   BILITOT 0.5 08/06/2023 1153   BILIDIR 0.2 07/27/2021 0907      Component Value Date/Time   TSH 1.240 08/06/2023 1153   Nutritional Lab Results  Component Value Date   VD25OH 27.3 (L) 08/06/2023   VD25OH 33.6 06/27/2022     ASSESSMENT AND PLAN  TREATMENT PLAN FOR OBESITY:  Recommended Dietary Goals  Thomas [name] is currently in the action stage of change. As such, his goal is to continue weight management plan. He has agreed to practicing portion control and making smarter food choices, such as increasing vegetables and decreasing simple carbohydrates.  Needs to decrease carbs, increase protein intake and stop eating late at night.    Behavioral Intervention  We discussed the  following Behavioral Modification Strategies today: increasing lean protein intake to established goals, decreasing simple carbohydrates , increasing vegetables, increasing fiber rich foods, increasing water intake , work on meal planning and preparation, reading food labels , keeping healthy foods at home, planning for success, better snacking choices, continue to work on maintaining a reduced calorie state, getting the recommended amount of protein, incorporating whole foods, making healthy choices, staying well hydrated and practicing mindfulness when eating., and increase protein intake, fibrous foods (25 grams per day for women, 30 grams for men) and water to improve satiety and decrease hunger signals. .  Additional resources provided today: NA  Recommended Physical Activity Goals  Keven  has been advised to work up to 150 minutes of moderate intensity aerobic activity a week and strengthening exercises 2-3 times per week for cardiovascular health, weight loss maintenance and preservation of muscle mass.   He has agreed to Think about enjoyable ways to increase daily physical activity and overcoming barriers to exercise, Increase physical activity in their day and reduce sedentary time (increase NEAT)., Continue to gradually increase the amount and intensity of exercise routine, Increase volume of physical activity to a goal of 240 minutes a week, and Combine aerobic and strengthening exercises for efficiency and improved cardiometabolic health.   Pharmacotherapy We discussed various medication options to help Adler with his weight loss efforts and we both agreed to finish last dose of Zepbound  12.5mg  and then increase to Zepbound  15mg . Side effects discussed.  Avoid phentermine, Qsymia and Contrave due to hypertension, atrial fibrillation, CKD, CHF and history of CVA.   ASSOCIATED CONDITIONS ADDRESSED TODAY  Action/Plan  Essential hypertension Managed by cardiology and nephrology.   Continue to follow-up with both and take medications as directed.  Class 3 severe obesity due to excess calories with serious comorbidity and body mass index (BMI) of 40.0 to 44.9 in adult (HCC) -     Increase Zepbound ; Inject 15 mg into the skin once a week.  Dispense: 2 mL; Refill: 0.  Side effects discussed      Bio impedance reviewed with patient.    Return in about 4 weeks (around 10/19/2024).SABRA He was informed of the importance of frequent follow up visits to maximize his success with intensive lifestyle modifications for his multiple health conditions.   ATTESTASTION STATEMENTS:  Reviewed by clinician on day of visit: allergies, medications, problem list, medical history, surgical history, family history, social history, and previous encounter notes.    Corean SAUNDERS. Arabela Basaldua FNP-C

## 2024-09-21 NOTE — Progress Notes (Deleted)
                                                                                                              WEIGHT SUMMARY AND BIOMETRICS  No data recorded No data recorded  No data recorded No data recorded No data recorded No data recorded  OBESITY Thomas [name] is here to discuss his progress with his obesity treatment plan along with follow-up of his obesity related diagnoses.    Nutrition Plan: the Category 4 plan - ***% adherence.  Current exercise: {exercise types:16438}  Interim History:  *** {aabnutritionassessment:29213}   Pharmacotherapy: Thomas [name] is on {dwwpharmacotherapy:29109} Adverse side effects: {dwwse:29122} Hunger is {EWCONTROLASSESSMENT:24261}.  Cravings are {EWCONTROLASSESSMENT:24261}.  Assessment/Plan:   There are no diagnoses linked to this encounter.    {dwwmorbid:29108::Morbid Obesity}: Current BMI No data recorded  Pharmacotherapy Plan {dwwmed:29123}  {dwwpharmacotherapy:29109}  Thomas [name] {CHL AMB IS/IS NOT:210130109} currently in the action stage of change. As such, his goal is to {MWMwtloss#1:210800005}.  He has agreed to {dwwsldiets:29085}.  Exercise goals: {MWM EXERCISE RECS:23473}  Behavioral modification strategies: {dwwslwtlossstrategies:29088}.  Thomas [name] has agreed to follow-up with our clinic in {NUMBER 1-10:22536} weeks.   No orders of the defined types were placed in this encounter.   There are no discontinued medications.   No orders of the defined types were placed in this encounter.     Objective:   VITALS: Per patient if applicable, see vitals. GENERAL: Alert and in no acute distress. CARDIOPULMONARY: No increased WOB. Speaking in clear sentences.  PSYCH: Pleasant and cooperative. Speech normal rate and rhythm. Affect is appropriate. Insight and judgement are appropriate. Attention is focused, linear, and appropriate.  NEURO: Oriented as arrived to appointment on time with no prompting.   Attestation Statements:   This  was prepared with the assistance of Engineer, civil (consulting).  Occasional wrong-word or sound-a-like substitutions may have occurred due to the inherent limitations of voice recognition

## 2024-10-18 ENCOUNTER — Ambulatory Visit: Admitting: Nurse Practitioner

## 2024-10-19 ENCOUNTER — Encounter: Payer: Self-pay | Admitting: Nurse Practitioner

## 2024-10-19 ENCOUNTER — Ambulatory Visit (INDEPENDENT_AMBULATORY_CARE_PROVIDER_SITE_OTHER): Admitting: Nurse Practitioner

## 2024-10-19 VITALS — BP 142/90 | HR 75 | Temp 98.0°F | Ht 73.0 in | Wt 308.0 lb

## 2024-10-19 DIAGNOSIS — I10 Essential (primary) hypertension: Secondary | ICD-10-CM | POA: Diagnosis not present

## 2024-10-19 DIAGNOSIS — E66813 Obesity, class 3: Secondary | ICD-10-CM | POA: Diagnosis not present

## 2024-10-19 DIAGNOSIS — Z6841 Body Mass Index (BMI) 40.0 and over, adult: Secondary | ICD-10-CM

## 2024-10-19 MED ORDER — ZEPBOUND 15 MG/0.5ML ~~LOC~~ SOAJ: 15.0000 mg | SUBCUTANEOUS | 0 refills | Status: DC

## 2024-10-19 NOTE — Progress Notes (Signed)
 Office: [phone]  /  Fax: [phone]  WEIGHT SUMMARY AND BIOMETRICS  Weight Lost Since Last Visit: 7lb  Weight Gained Since Last Visit: 0lb   Vitals Temp: 98 F (36.7 C) BP: (!) 175/111 Pulse Rate: 75 SpO2: 97 %   Anthropometric Measurements Height: 6' 1 (1.854 m) Weight: (!) 308 lb (139.7 kg) BMI (Calculated): 40.64 Weight at Last Visit: 315lb Weight Lost Since Last Visit: 7lb Weight Gained Since Last Visit: 0lb Starting Weight: 326lb Total Weight Loss (lbs): 18 lb (8.165 kg)   Body Composition  Body Fat %: 33.9 % Fat Mass (lbs): 104.4 lbs Muscle Mass (lbs): 193.8 lbs Total Body Water (lbs): 134.2 lbs Visceral Fat Rating : 19   Other Clinical Data Fasting: Yes Labs: No Today's Visit #: 22 Starting Date: 06/27/22     HPI  Chief Complaint: OBESITY  Raja is here to discuss his progress with his obesity treatment plan. He is on the the Category 4 Plan and states he is following his eating plan approximately 40 % of the time. He states he is exercising 90 minutes 1 days per week.   Interval History:  Since last office visit he has lost 7 pounds.  He has been drinking more water and eating more protein (chicken based meals).  He has decreased muffins, iced coffee, cookies and has been eating more fruits and vegetables.  He has stopped eating/snacking late at night.  He has gone to the gym twice since his last visit.  He has an active job    Patient has a past medical history of atrial fibrillation, hypertension, hypertensive heart disease without heart failure, obstructive sleep apnea syndrome on CPAP, CKD, history of CVA, stage III CKD and vitamin D  deficiency.   Pharmacotherapy for weight loss: He is currently taking Zepbound  15 mg for medical weight loss (increased dose after last visit).  Denies side effects.    Avoid phentermine, Qsymia and Contrave due to hypertension, atrial fibrillation, CKD, CHF and history of CVA.    Previous  pharmacotherapy for medical weight loss:  Wegovy    Bariatric surgery:  Patient has not had bariatric surgery  Hypertension Hypertension poorly controlled.  Medication(s): Cozaar 100 mg, apresoline  100mg , chlorthalidone 25mg  with potassium 10 meq. Denies side effects.  Denies chest pain, palpitations, LEE and SHOB.  BP Readings from Last 3 Encounters:  10/19/24 (!) 175/111  09/21/24 (!) 146/85  08/26/24 131/83   Lab Results  Component Value Date   CREATININE 1.43 (H) 06/12/2024   CREATININE 1.52 (H) 08/06/2023   CREATININE 1.75 (H) 06/27/2022     PHYSICAL EXAM:  Blood pressure (!) 175/111, pulse 75, temperature 98 F (36.7 C), height 6' 1 (1.854 m), weight (!) 308 lb (139.7 kg), SpO2 97%. Body mass index is 40.64 kg/m.  General: He is overweight, cooperative, alert, well developed, and in no acute distress. PSYCH: Has normal mood, affect and thought process.   Extremities: No edema.  Neurologic: No gross sensory or motor deficits. No tremors or fasciculations noted.    DIAGNOSTIC DATA REVIEWED:  BMET    Component Value Date/Time   NA 139 06/12/2024 1100   NA 141 08/06/2023 1153   K 3.9 06/12/2024 1100   CL 103 06/12/2024 1100   CO2 25 06/12/2024 1100   GLUCOSE 88 06/12/2024 1100   BUN 20 06/12/2024 1100   BUN 22 08/06/2023 1153   CREATININE 1.43 (H) 06/12/2024 1100   CREATININE 1.61 (H) 03/29/2022 1525   CALCIUM  9.4 06/12/2024 1100  GFRNONAA >60 06/12/2024 1100   GFRAA 70 07/28/2020 0944   Lab Results  Component Value Date   HGBA1C 5.9 (H) 08/06/2023   HGBA1C  12/04/2008    5.5 (NOTE)   The ADA recommends the following therapeutic goal for glycemic   control related to Hgb A1C measurement:   Goal of Therapy:   < 7.0% Hgb A1C   Reference: American Diabetes Association: Clinical Practice   Recommendations 2008, Diabetes Care,  2008, 31:(Suppl 1).   Lab Results  Component Value Date   INSULIN  27.5 (H) 08/06/2023   Lab Results  Component Value Date   TSH  1.240 08/06/2023   CBC    Component Value Date/Time   WBC 4.0 06/12/2024 1100   RBC 4.72 06/12/2024 1100   HGB 13.8 06/12/2024 1100   HGB 13.7 08/27/2023 1232   HCT 41.3 06/12/2024 1100   HCT 42.2 08/27/2023 1232   PLT 160 06/12/2024 1100   PLT 155 08/27/2023 1232   MCV 87.5 06/12/2024 1100   MCV 91 08/27/2023 1232   MCH 29.2 06/12/2024 1100   MCHC 33.4 06/12/2024 1100   RDW 14.2 06/12/2024 1100   RDW 14.2 08/27/2023 1232   Iron Studies No results found for: IRON, TIBC, FERRITIN, IRONPCTSAT Lipid Panel     Component Value Date/Time   CHOL 120 08/06/2023 1153   TRIG 71 08/06/2023 1153   HDL 51 08/06/2023 1153   CHOLHDL 2.5 06/27/2022 1115   CHOLHDL 2 07/27/2021 0907   VLDL 11.0 07/27/2021 0907   LDLCALC 54 08/06/2023 1153   LDLCALC 82 04/09/2021 1417   Hepatic Function Panel     Component Value Date/Time   PROT 7.5 06/12/2024 1100   PROT 7.2 08/06/2023 1153   ALBUMIN 4.3 06/12/2024 1100   ALBUMIN 4.5 08/06/2023 1153   AST 45 (H) 06/12/2024 1100   ALT 39 06/12/2024 1100   ALKPHOS 50 06/12/2024 1100   BILITOT 0.5 06/12/2024 1100   BILITOT 0.5 08/06/2023 1153   BILIDIR 0.2 07/27/2021 0907      Component Value Date/Time   TSH 1.240 08/06/2023 1153   Nutritional Lab Results  Component Value Date   VD25OH 27.3 (L) 08/06/2023   VD25OH 33.6 06/27/2022     ASSESSMENT AND PLAN  TREATMENT PLAN FOR OBESITY:  Recommended Dietary Goals  Kaliq is currently in the action stage of change. As such, his goal is to continue weight management plan. He has agreed to practicing portion control and making smarter food choices, such as increasing vegetables and decreasing simple carbohydrates.  Behavioral Intervention  We discussed the following Behavioral Modification Strategies today: increasing lean protein intake to established goals, decreasing simple carbohydrates , increasing vegetables, increasing fiber rich foods, increasing water intake , work on meal  planning and preparation, reading food labels , keeping healthy foods at home, avoiding temptations and identifying enticing environmental cues, continue to work on maintaining a reduced calorie state, getting the recommended amount of protein, incorporating whole foods, making healthy choices, staying well hydrated and practicing mindfulness when eating., and increase protein intake, fibrous foods (25 grams per day for women, 30 grams for men) and water to improve satiety and decrease hunger signals. .  Additional resources provided today: NA  Recommended Physical Activity Goals  Vence has been advised to work up to 150 minutes of moderate intensity aerobic activity a week and strengthening exercises 2-3 times per week for cardiovascular health, weight loss maintenance and preservation of muscle mass.   He has agreed to Think  about enjoyable ways to increase daily physical activity and overcoming barriers to exercise, Increase physical activity in their day and reduce sedentary time (increase NEAT)., Continue to gradually increase the amount and intensity of exercise routine, Increase volume of physical activity to a goal of 240 minutes a week, and Combine aerobic and strengthening exercises for efficiency and improved cardiometabolic health.   Pharmacotherapy We discussed various medication options to help Tyress with his weight loss efforts and we both agreed to continue Zepbound  15mg .  Side effects discussed.  Avoid phentermine, Qsymia and Contrave due to hypertension, atrial fibrillation, CKD, CHF and history of CVA.   ASSOCIATED CONDITIONS ADDRESSED TODAY  Action/Plan  Essential hypertension His BP continues to be elevated.  I've asked him to reach out to his cardiologist concerning his BP.  He states he just took his medications right before he left his house to come here.  Looked better on recheck.    Class 3 severe obesity due to excess calories with serious comorbidity and body mass  index (BMI) of 40.0 to 44.9 in adult (HCC) -     Zepbound ; Inject 15 mg into the skin once a week.  Dispense: 6 mL; Refill: 0     He had labs July 21st.  Will send to me via mychart.  He has an appt with his PCP for follow up and labs in January,     Return in about 4 weeks (around 11/16/2024).SABRA He was informed of the importance of frequent follow up visits to maximize his success with intensive lifestyle modifications for his multiple health conditions.   ATTESTASTION STATEMENTS:  Reviewed by clinician on day of visit: allergies, medications, problem list, medical history, surgical history, family history, social history, and previous encounter notes.      Corean SAUNDERS. Raiyah Speakman FNP-C

## 2024-10-20 ENCOUNTER — Encounter: Payer: Self-pay | Admitting: Nurse Practitioner

## 2024-11-30 ENCOUNTER — Telehealth: Payer: Self-pay

## 2024-11-30 NOTE — Telephone Encounter (Signed)
 Received through Cover My Meds:  PA Case: [id number], Status: Approved, Coverage Starts on: 11/30/2024 12:00:00 AM, Coverage Ends on: 11/30/2025 12:00:00 AM.. Authorization Expiration Date: November 30, 2025.

## 2024-11-30 NOTE — Telephone Encounter (Signed)
 PA submitted through Cover My Meds for Zepbound . Awaiting insurance determination. Key: [id number]

## 2024-12-14 LAB — HEMOGLOBIN A1C: Hemoglobin A1C: 5.6

## 2024-12-14 LAB — LIPID PANEL
Cholesterol: 104 (ref 0–200)
HDL: 44 (ref 35–70)
LDL Cholesterol: 49
Triglycerides: 73 (ref 40–160)

## 2024-12-14 LAB — HEPATIC FUNCTION PANEL
ALT: 36 U/L (ref 10–40)
AST: 38 (ref 14–40)
Alkaline Phosphatase: 53 (ref 25–125)
Bilirubin, Direct: 0.2 (ref 0.01–0.4)
Bilirubin, Total: 0.4

## 2024-12-14 LAB — BASIC METABOLIC PANEL WITH GFR
BUN: 26 — AB (ref 4–21)
Chloride: 106 (ref 99–108)
Potassium: 3.8 meq/L (ref 3.5–5.1)
Sodium: 143 (ref 137–147)

## 2024-12-14 LAB — CBC AND DIFFERENTIAL
HCT: 41 (ref 41–53)
Hemoglobin: 13.8 (ref 13.5–17.5)
Platelets: 184 K/uL (ref 150–400)
WBC: 3.5

## 2024-12-14 LAB — COMPREHENSIVE METABOLIC PANEL WITH GFR
Albumin: 4.5 (ref 3.5–5.0)
Calcium: 10 (ref 8.7–10.7)

## 2024-12-14 LAB — CBC: RBC: 4.76 (ref 3.87–5.11)

## 2024-12-15 ENCOUNTER — Ambulatory Visit (INDEPENDENT_AMBULATORY_CARE_PROVIDER_SITE_OTHER): Payer: Self-pay | Admitting: Nurse Practitioner

## 2024-12-15 ENCOUNTER — Encounter: Payer: Self-pay | Admitting: Nurse Practitioner

## 2024-12-15 VITALS — BP 135/85 | HR 75 | Temp 97.5°F | Ht 73.0 in | Wt 308.0 lb

## 2024-12-15 DIAGNOSIS — E66813 Obesity, class 3: Secondary | ICD-10-CM | POA: Diagnosis not present

## 2024-12-15 DIAGNOSIS — Z6841 Body Mass Index (BMI) 40.0 and over, adult: Secondary | ICD-10-CM

## 2024-12-15 DIAGNOSIS — I10 Essential (primary) hypertension: Secondary | ICD-10-CM | POA: Diagnosis not present

## 2024-12-15 MED ORDER — ZEPBOUND 15 MG/0.5ML ~~LOC~~ SOAJ: 15.0000 mg | SUBCUTANEOUS | 0 refills | Status: AC

## 2024-12-15 NOTE — Progress Notes (Signed)
 "  Office: [phone]  /  Fax: [phone]  WEIGHT SUMMARY AND BIOMETRICS  Weight Lost Since Last Visit: 0lb  Weight Gained Since Last Visit: 0lb   Vitals Temp: (!) 97.5 F (36.4 C) BP: 135/85 Pulse Rate: 75 SpO2: 100 %   Anthropometric Measurements Height: 6' 1 (1.854 m) Weight: (!) 308 lb (139.7 kg) BMI (Calculated): 40.64 Weight at Last Visit: 308lb Weight Lost Since Last Visit: 0lb Weight Gained Since Last Visit: 0lb Starting Weight: 326lb Total Weight Loss (lbs): 18 lb (8.165 kg)   Body Composition  Body Fat %: 34.2 % Fat Mass (lbs): 105.4 lbs Muscle Mass (lbs): 193.2 lbs Total Body Water (lbs): 137.6 lbs Visceral Fat Rating : 19   Other Clinical Data Fasting: No Labs: No Today's Visit #: 23 Starting Date: 06/27/22     HPI  Chief Complaint: OBESITY  Thomas [name] is here to discuss his progress with his obesity treatment plan. He is on the the Category 4 Plan and states he is following his eating plan approximately 40 % of the time. He states he is exercising 0 minutes 0 days per week.   Interval History:  Since last office visit he has maintained his weight.  He notes that he got off track over the holidays.  He saw his PCP yesterday and had labs obtained.  Will send to me via mychart.  He is seeing nephrology tomorrow.  He got [employer] for Christmas.  He planning to turn his garage into a gym.    Patient has a past medical history of atrial fibrillation, hypertension, hypertensive heart disease without heart failure, obstructive sleep apnea syndrome on CPAP, CKD, history of CVA, stage III CKD and vitamin D  deficiency.   Pharmacotherapy for weight loss: He is currently taking Zepbound  15 mg for medical weight loss.  Denies side effects.   Zepbound  approved until 11/30/25   Avoid phentermine, Qsymia and Contrave due to hypertension, atrial fibrillation, CKD, CHF and history of CVA.    Previous pharmacotherapy for medical weight loss:  Wegovy     Bariatric surgery:  Patient has not had bariatric surgery  Hypertension Hypertension looked better today.  Medication(s): Cozaar 100 mg, apresoline  100mg , chlorthalidone 25mg  with potassium 10 meq. Denies side effects.  Denies chest pain, palpitations and SOB.  BP Readings from Last 3 Encounters:  12/15/24 135/85  10/19/24 (!) 142/90  09/21/24 (!) 146/85   Lab Results  Component Value Date   CREATININE 1.43 (H) 06/12/2024   CREATININE 1.52 (H) 08/06/2023   CREATININE 1.75 (H) 06/27/2022     PHYSICAL EXAM:  Blood pressure 135/85, pulse 75, temperature (!) 97.5 F (36.4 C), height 6' 1 (1.854 m), weight (!) 308 lb (139.7 kg), SpO2 100%. Body mass index is 40.64 kg/m.  General: He is overweight, cooperative, alert, well developed, and in no acute distress. PSYCH: Has normal mood, affect and thought process.   Extremities: No edema.  Neurologic: No gross sensory or motor deficits. No tremors or fasciculations noted.    DIAGNOSTIC DATA REVIEWED:  BMET    Component Value Date/Time   NA 139 06/12/2024 1100   NA 141 08/06/2023 1153   K 3.9 06/12/2024 1100   CL 103 06/12/2024 1100   CO2 25 06/12/2024 1100   GLUCOSE 88 06/12/2024 1100   BUN 20 06/12/2024 1100   BUN 22 08/06/2023 1153   CREATININE 1.43 (H) 06/12/2024 1100   CREATININE 1.61 (H) 03/29/2022 1525   CALCIUM  9.4 06/12/2024 1100   GFRNONAA >60 06/12/2024 1100  GFRAA 70 07/28/2020 0944   Lab Results  Component Value Date   HGBA1C 5.9 (H) 08/06/2023   HGBA1C  12/04/2008    5.5 (NOTE)   The ADA recommends the following therapeutic goal for glycemic   control related to Hgb A1C measurement:   Goal of Therapy:   < 7.0% Hgb A1C   Reference: American Diabetes Association: Clinical Practice   Recommendations 2008, Diabetes Care,  2008, 31:(Suppl 1).   Lab Results  Component Value Date   INSULIN  27.5 (H) 08/06/2023   Lab Results  Component Value Date   TSH 1.240 08/06/2023   CBC    Component Value  Date/Time   WBC 4.0 06/12/2024 1100   RBC 4.72 06/12/2024 1100   HGB 13.8 06/12/2024 1100   HGB 13.7 08/27/2023 1232   HCT 41.3 06/12/2024 1100   HCT 42.2 08/27/2023 1232   PLT 160 06/12/2024 1100   PLT 155 08/27/2023 1232   MCV 87.5 06/12/2024 1100   MCV 91 08/27/2023 1232   MCH 29.2 06/12/2024 1100   MCHC 33.4 06/12/2024 1100   RDW 14.2 06/12/2024 1100   RDW 14.2 08/27/2023 1232   Iron Studies No results found for: IRON, TIBC, FERRITIN, IRONPCTSAT Lipid Panel     Component Value Date/Time   CHOL 120 08/06/2023 1153   TRIG 71 08/06/2023 1153   HDL 51 08/06/2023 1153   CHOLHDL 2.5 06/27/2022 1115   CHOLHDL 2 07/27/2021 0907   VLDL 11.0 07/27/2021 0907   LDLCALC 54 08/06/2023 1153   LDLCALC 82 04/09/2021 1417   Hepatic Function Panel     Component Value Date/Time   PROT 7.5 06/12/2024 1100   PROT 7.2 08/06/2023 1153   ALBUMIN 4.3 06/12/2024 1100   ALBUMIN 4.5 08/06/2023 1153   AST 45 (H) 06/12/2024 1100   ALT 39 06/12/2024 1100   ALKPHOS 50 06/12/2024 1100   BILITOT 0.5 06/12/2024 1100   BILITOT 0.5 08/06/2023 1153   BILIDIR 0.2 07/27/2021 0907      Component Value Date/Time   TSH 1.240 08/06/2023 1153   Nutritional Lab Results  Component Value Date   VD25OH 27.3 (L) 08/06/2023   VD25OH 33.6 06/27/2022     ASSESSMENT AND PLAN  TREATMENT PLAN FOR OBESITY:  Recommended Dietary Goals  Thomas [name] is currently in the action stage of change. As such, his goal is to continue weight management plan. He has agreed to the Category 4 Plan.  Behavioral Intervention  We discussed the following Behavioral Modification Strategies today: increasing lean protein intake to established goals, decreasing simple carbohydrates , increasing vegetables, increasing fiber rich foods, increasing water intake , reading food labels , keeping healthy foods at home, continue to work on maintaining a reduced calorie state, getting the recommended amount of protein, incorporating  whole foods, making healthy choices, staying well hydrated and practicing mindfulness when eating., and increase protein intake, fibrous foods (25 grams per day for women, 30 grams for men) and water to improve satiety and decrease hunger signals. .  Additional resources provided today: NA  Recommended Physical Activity Goals  Thomas [name] has been advised to work up to 150 minutes of moderate intensity aerobic activity a week and strengthening exercises 2-3 times per week for cardiovascular health, weight loss maintenance and preservation of muscle mass.   He has agreed to Think about enjoyable ways to increase daily physical activity and overcoming barriers to exercise, Increase physical activity in their day and reduce sedentary time (increase NEAT)., Work on scheduling and tracking physical  activity. , Increase volume of physical activity to a goal of 240 minutes a week, and Combine aerobic and strengthening exercises for efficiency and improved cardiometabolic health.   Pharmacotherapy We discussed various medication options to help Thomas [name] with his weight loss efforts and we both agreed to continue Zepbound  15mg . Side effects discussed.  ASSOCIATED CONDITIONS ADDRESSED TODAY  Action/Plan  Essential hypertension Managed by PCP and nephrology Continue to follow up with both Continue meds as directed  Thomas [name] is working on healthy weight loss and exercise to improve blood pressure control. We will watch for signs of hypotension as he continues his lifestyle modifications.   Class 3 severe obesity due to excess calories with serious comorbidity and body mass index (BMI) of 40.0 to 44.9 in adult (HCC) -     Zepbound ; Inject 15 mg into the skin once a week.  Dispense: 2 mL; Refill: 0      To send me labs via mychart   Return in about 4 weeks (around 01/12/2025).Thomas [name] He was informed of the importance of frequent follow up visits to maximize his success with intensive lifestyle modifications for his  multiple health conditions.   ATTESTASTION STATEMENTS:  Reviewed by clinician on day of visit: allergies, medications, problem list, medical history, surgical history, family history, social history, and previous encounter notes.     Thomas [name] SAUNDERS. Margarita Bobrowski FNP-C "

## 2024-12-18 ENCOUNTER — Encounter: Payer: Self-pay | Admitting: Nurse Practitioner

## 2024-12-20 ENCOUNTER — Encounter: Payer: Self-pay | Admitting: Nurse Practitioner

## 2025-01-11 ENCOUNTER — Ambulatory Visit: Admitting: Nurse Practitioner
